# Patient Record
Sex: Female | Born: 1960 | Race: White | Hispanic: No | Smoking: Current every day smoker
Health system: Southern US, Community
[De-identification: ages and names within clinical notes are randomized; demographics above are authoritative.]

## PROBLEM LIST (undated history)

## (undated) DIAGNOSIS — G4730 Sleep apnea, unspecified: Secondary | ICD-10-CM

## (undated) DIAGNOSIS — M549 Dorsalgia, unspecified: Secondary | ICD-10-CM

## (undated) DIAGNOSIS — F32A Depression, unspecified: Secondary | ICD-10-CM

## (undated) DIAGNOSIS — R911 Solitary pulmonary nodule: Secondary | ICD-10-CM

## (undated) DIAGNOSIS — M51369 Other intervertebral disc degeneration, lumbar region without mention of lumbar back pain or lower extremity pain: Secondary | ICD-10-CM

## (undated) DIAGNOSIS — E7800 Pure hypercholesterolemia, unspecified: Secondary | ICD-10-CM

## (undated) DIAGNOSIS — F419 Anxiety disorder, unspecified: Secondary | ICD-10-CM

## (undated) DIAGNOSIS — J939 Pneumothorax, unspecified: Secondary | ICD-10-CM

## (undated) DIAGNOSIS — M1712 Unilateral primary osteoarthritis, left knee: Secondary | ICD-10-CM

## (undated) DIAGNOSIS — F17200 Nicotine dependence, unspecified, uncomplicated: Secondary | ICD-10-CM

## (undated) DIAGNOSIS — M5136 Other intervertebral disc degeneration, lumbar region: Secondary | ICD-10-CM

## (undated) DIAGNOSIS — J449 Chronic obstructive pulmonary disease, unspecified: Secondary | ICD-10-CM

## (undated) DIAGNOSIS — M5416 Radiculopathy, lumbar region: Secondary | ICD-10-CM

## (undated) DIAGNOSIS — K219 Gastro-esophageal reflux disease without esophagitis: Secondary | ICD-10-CM

## (undated) DIAGNOSIS — F329 Major depressive disorder, single episode, unspecified: Secondary | ICD-10-CM

## (undated) HISTORY — PX: FOOT SURGERY: SHX648

## (undated) HISTORY — PX: COLONOSCOPY: SHX174

## (undated) HISTORY — DX: Sleep apnea, unspecified: G47.30

## (undated) HISTORY — PX: ECTOPIC PREGNANCY SURGERY: SHX613

## (undated) HISTORY — PX: TRIGGER FINGER RELEASE: SHX641

## (undated) HISTORY — PX: ABDOMINAL HYSTERECTOMY: SHX81

---
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
## 2004-04-16 ENCOUNTER — Other Ambulatory Visit: Payer: Self-pay

## 2005-07-13 ENCOUNTER — Ambulatory Visit: Payer: Self-pay | Admitting: Internal Medicine

## 2006-08-06 ENCOUNTER — Ambulatory Visit: Payer: Self-pay | Admitting: Internal Medicine

## 2007-08-21 ENCOUNTER — Ambulatory Visit: Payer: Self-pay | Admitting: Internal Medicine

## 2008-08-31 ENCOUNTER — Ambulatory Visit: Payer: Self-pay | Admitting: Internal Medicine

## 2009-09-06 ENCOUNTER — Ambulatory Visit: Payer: Self-pay | Admitting: Internal Medicine

## 2010-10-17 ENCOUNTER — Ambulatory Visit: Payer: Self-pay | Admitting: Internal Medicine

## 2011-07-12 ENCOUNTER — Inpatient Hospital Stay: Payer: Self-pay | Admitting: Internal Medicine

## 2011-08-20 ENCOUNTER — Ambulatory Visit: Payer: Self-pay | Admitting: Gastroenterology

## 2011-08-23 LAB — PATHOLOGY REPORT

## 2012-09-03 ENCOUNTER — Ambulatory Visit: Payer: Self-pay | Admitting: Internal Medicine

## 2013-09-03 ENCOUNTER — Ambulatory Visit: Payer: Self-pay | Admitting: Physical Medicine and Rehabilitation

## 2013-09-08 ENCOUNTER — Ambulatory Visit: Payer: Self-pay | Admitting: Internal Medicine

## 2015-05-31 ENCOUNTER — Other Ambulatory Visit: Payer: Self-pay | Admitting: Internal Medicine

## 2015-05-31 DIAGNOSIS — Z1231 Encounter for screening mammogram for malignant neoplasm of breast: Secondary | ICD-10-CM

## 2015-06-16 ENCOUNTER — Ambulatory Visit: Payer: Self-pay | Attending: Internal Medicine

## 2015-06-28 ENCOUNTER — Other Ambulatory Visit: Payer: Self-pay | Admitting: Neurosurgery

## 2015-06-28 DIAGNOSIS — G8929 Other chronic pain: Secondary | ICD-10-CM

## 2015-06-28 DIAGNOSIS — M545 Low back pain: Principal | ICD-10-CM

## 2015-08-24 ENCOUNTER — Ambulatory Visit (INDEPENDENT_AMBULATORY_CARE_PROVIDER_SITE_OTHER): Payer: BC Managed Care – PPO | Admitting: Podiatry

## 2015-08-24 ENCOUNTER — Encounter: Payer: Self-pay | Admitting: Podiatry

## 2015-08-24 ENCOUNTER — Ambulatory Visit (INDEPENDENT_AMBULATORY_CARE_PROVIDER_SITE_OTHER): Payer: BC Managed Care – PPO

## 2015-08-24 VITALS — BP 163/91 | HR 51 | Resp 16 | Ht 67.0 in | Wt 160.0 lb

## 2015-08-24 DIAGNOSIS — M722 Plantar fascial fibromatosis: Secondary | ICD-10-CM | POA: Diagnosis not present

## 2015-08-24 DIAGNOSIS — M79673 Pain in unspecified foot: Secondary | ICD-10-CM

## 2015-08-24 DIAGNOSIS — Q828 Other specified congenital malformations of skin: Secondary | ICD-10-CM

## 2015-08-24 MED ORDER — MELOXICAM 15 MG PO TABS: 15.0000 mg | ORAL_TABLET | Freq: Every day | ORAL | Status: DC

## 2015-08-24 MED ORDER — METHYLPREDNISOLONE 4 MG PO TBPK: ORAL_TABLET | ORAL | Status: DC

## 2015-08-24 NOTE — Progress Notes (Signed)
   Subjective:    Patient ID: Vanessa Greer, female    DOB: October 06, 1961, 54 y.o.   MRN: [id number]  HPI: She presents today chief complaint painful right ankle on the lateral side we states that she started on the medial side. She states that she has now developed a painful lesion beneath the fifth metatarsal where she had previously had surgery to prevent its return. She denies any trauma to the foot agrees that has gotten worse over the past several months.    Review of Systems  All other systems reviewed and are negative.      Objective:   Physical Exam: 54 year old female no acute distress vital signs stable alert and oriented 3 pulses are strongly palpable bilateral. Neurologic sensorium is intact per Semmes-Weinstein monofilament. Deep tendon reflexes are intact. Muscle strength is 5 over 5 dorsiflexion plantar flexors and inverters and everters all intrinsic musculature is intact. Orthopedic evaluation demonstrates all joints distal to the ankle for range of motion without crepitation. She has pain on palpation of the medial calcaneal tubercles bilaterally she also has tenderness on palpation of the posterior tibial tendon and the peroneal tendons. Cutaneous evaluation does demonstrate reactive hyperkeratosis to the plantar aspect of the fifth metatarsal of the right foot. 3 views radiographs taken today in the office demonstrate lateral views soft tissue increase in density plantar fascial calcaneal insertion site of the right foot. A screw to the fifth metatarsal which went on to heal uneventfully status post fifth metatarsal osteotomy. There is no prominent plantar condyle that should result in symptoms similar to this.        Assessment & Plan:  Assessment: Plantar fasciitis right foot resulting in lateral compensatory pain and porokeratosis fifth right. This is also compensating for periodic tendinitis and posterior tibial tendinitis.  Plan: Discussed etiology pathology  conservative versus surgical therapies. We discussed appropriate shoe gear stretching exercises ice therapy shoe gear modifications. A prescription for Medrol Dosepak was prescribed as well as meloxicam. I injected her right heel today with Kenalog and local and aesthetic dispensable myofascial brace and a night splint. We will follow up with her in 1 month.

## 2015-08-24 NOTE — Patient Instructions (Signed)

## 2015-08-25 ENCOUNTER — Ambulatory Visit: Payer: BC Managed Care – PPO

## 2015-09-28 ENCOUNTER — Ambulatory Visit: Payer: BC Managed Care – PPO | Admitting: Podiatry

## 2015-11-07 ENCOUNTER — Emergency Department: Payer: BLUE CROSS/BLUE SHIELD

## 2015-11-07 ENCOUNTER — Emergency Department
Admission: EM | Admit: 2015-11-07 | Discharge: 2015-11-07 | Disposition: A | Discharge status: Home / Self Care | Payer: BLUE CROSS/BLUE SHIELD | Attending: Emergency Medicine | Admitting: Emergency Medicine

## 2015-11-07 ENCOUNTER — Encounter: Payer: Self-pay | Admitting: Emergency Medicine

## 2015-11-07 DIAGNOSIS — S42254A Nondisplaced fracture of greater tuberosity of right humerus, initial encounter for closed fracture: Secondary | ICD-10-CM | POA: Diagnosis not present

## 2015-11-07 DIAGNOSIS — Z791 Long term (current) use of non-steroidal anti-inflammatories (NSAID): Secondary | ICD-10-CM | POA: Insufficient documentation

## 2015-11-07 DIAGNOSIS — Y998 Other external cause status: Secondary | ICD-10-CM | POA: Diagnosis not present

## 2015-11-07 DIAGNOSIS — W01198A Fall on same level from slipping, tripping and stumbling with subsequent striking against other object, initial encounter: Secondary | ICD-10-CM | POA: Diagnosis not present

## 2015-11-07 DIAGNOSIS — Y9389 Activity, other specified: Secondary | ICD-10-CM | POA: Insufficient documentation

## 2015-11-07 DIAGNOSIS — Z79899 Other long term (current) drug therapy: Secondary | ICD-10-CM | POA: Insufficient documentation

## 2015-11-07 DIAGNOSIS — Y9289 Other specified places as the place of occurrence of the external cause: Secondary | ICD-10-CM | POA: Diagnosis not present

## 2015-11-07 DIAGNOSIS — Z720 Tobacco use: Secondary | ICD-10-CM | POA: Diagnosis not present

## 2015-11-07 DIAGNOSIS — S4991XA Unspecified injury of right shoulder and upper arm, initial encounter: Secondary | ICD-10-CM | POA: Diagnosis present

## 2015-11-07 HISTORY — DX: Dorsalgia, unspecified: M54.9

## 2015-11-07 HISTORY — DX: Major depressive disorder, single episode, unspecified: F32.9

## 2015-11-07 HISTORY — DX: Pneumothorax, unspecified: J93.9

## 2015-11-07 HISTORY — DX: Anxiety disorder, unspecified: F41.9

## 2015-11-07 HISTORY — DX: Gastro-esophageal reflux disease without esophagitis: K21.9

## 2015-11-07 HISTORY — DX: Pure hypercholesterolemia, unspecified: E78.00

## 2015-11-07 HISTORY — DX: Depression, unspecified: F32.A

## 2015-11-07 MED ORDER — TRAMADOL HCL 50 MG PO TABS
50.0000 mg | ORAL_TABLET | Freq: Once | ORAL | Status: AC
  Administered 2015-11-07: 50 mg via ORAL
  Filled 2015-11-07: qty 1

## 2015-11-07 MED ORDER — TRAMADOL HCL 50 MG PO TABS: 50.0000 mg | ORAL_TABLET | Freq: Four times a day (QID) | ORAL | Status: DC | PRN

## 2015-11-07 MED ORDER — KETOROLAC TROMETHAMINE 60 MG/2ML IM SOLN
60.0000 mg | Freq: Once | INTRAMUSCULAR | Status: AC
  Administered 2015-11-07: 60 mg via INTRAMUSCULAR
  Filled 2015-11-07: qty 2

## 2015-11-07 NOTE — ED Notes (Signed)
Pt uprite on stretcher in exam room with no distress noted; reports at 3am tripped over her dog injuring right upper arm; denies any other c/o or injuries; no swelling noted, skin intact; +radial pulse, skin W&D, brisk cap refill; good movement/sensation noted

## 2015-11-07 NOTE — ED Notes (Signed)
Pt says she tripped over  Her dog this am; c/o pain to right upper arm; tender on palpation; denies other injuries

## 2015-11-07 NOTE — Discharge Instructions (Signed)
Shoulder Fracture °You have a fractured humerus (bone in the upper arm) at the shoulder just below the ball of the shoulder joint. Most of the time the bones of a broken shoulder are in an acceptable position. Usually the injury can be treated with a shoulder immobilizer or sling and swath bandage. These devices support the arm and prevent any shoulder movement. If the bones are not in a good position, then surgery is sometimes needed. Shoulder fractures usually cause swelling, pain, and discoloration around the upper arm initially. They heal in 8-12 weeks with proper treatment. °Rest in bed or a reclining chair as long as your shoulder is very painful. Sitting up generally results in less pain at the fracture site. Do not remove your shoulder bandage until your caregiver approves. You may apply ice packs over the shoulder for 20-30 minutes every 2 hours for the next 2-3 days to reduce the pain and swelling. Use your pain medicine as prescribed.  °SEEK IMMEDIATE MEDICAL CARE IF: °· You develop severe shoulder pain unrelieved by rest and taking pain medicine. °· You have pain, numbness, tingling, or weakness in the hand or wrist. °· You develop shortness of breath, chest pain, severe weakness, or fainting. °· You have severe pain with motion of the fingers or wrist. °MAKE SURE YOU:  °· Understand these instructions. °· Will watch your condition. °· Will get help right away if you are not doing well or get worse. °  °This information is not intended to replace advice given to you by your health care provider. Make sure you discuss any questions you have with your health care provider. °  °Document Released: 01/24/2005 Document Revised: 03/10/2012 Document Reviewed: 04/06/2009 °Elsevier Interactive Patient Education ©2016 [employer] ° °

## 2015-11-07 NOTE — ED Notes (Signed)
Pt to go to xray and then moved to room 19 for treatment

## 2015-11-07 NOTE — ED Provider Notes (Signed)
[facility] Emergency Department Provider Note  ____________________________________________  Time seen: Approximately 913 AM  I have reviewed the triage vital signs and the nursing notes.   HISTORY  Chief Complaint Fall and Arm Pain    HPI Vanessa Greer is a 54 y.o. female who comes into the hospital today with a fall. The patient reports that she tripped over her dog when she woke up and fell on her right side. The patient thinks she fell on her shoulder more than her elbow. It took her 15 minutes to get up but she reports that she did not hit her head. The patient reports that this occurred at 3 AM and she scraped her left foot. The patient denies loss of consciousness. She reports she has shoulder pain and mild arm pain. The patient took a shower to see if the pain would improve but it did not. The patient reports that if it moves she hurts but if she sitting still the pain is a 0. The patient also has a history of chronic back pain and has some mild back pain. The patient is here for further evaluation.   Past Medical History  Diagnosis Date  . Pneumothorax   . Anxiety   . GERD (gastroesophageal reflux disease)   . Back pain   . Depression   . High cholesterol     There are no active problems to display for this patient.   Past Surgical History  Procedure Laterality Date  . Abdominal hysterectomy    . Foot surgery    . Ectopic pregnancy surgery      Current Outpatient Rx  Name  Route  Sig  Dispense  Refill  . clonazePAM (KLONOPIN) 1 MG tablet            0   . FluPHENAZine HCl (PROLIXIN PO)   Oral   Take by mouth.         Marland Kitchen. HYDROcodone-acetaminophen (NORCO) 7.5-325 MG per tablet      take 1 tablet by mouth every 6 hours if needed for back pain      0   . LYRICA 150 MG capsule            0     Dispense as written.   . meloxicam (MOBIC) 15 MG tablet   Oral   Take 1 tablet (15 mg total) by mouth daily.   30 tablet   3    . methylPREDNISolone (MEDROL) 4 MG TBPK tablet      Tapering 6 day dose pack   21 tablet   0   . sertraline (ZOLOFT) 50 MG tablet            0   . simvastatin (ZOCOR) 40 MG tablet            0   . traMADol (ULTRAM) 50 MG tablet   Oral   Take 1 tablet (50 mg total) by mouth every 6 (six) hours as needed.   12 tablet   0     Allergies Review of patient's allergies indicates no known allergies.  History reviewed. No pertinent family history.  Social History Social History  Substance Use Topics  . Smoking status: Current Every Day Smoker -- 6.00 packs/day    Types: Cigarettes  . Smokeless tobacco: None  . Alcohol Use: No    Review of Systems Constitutional: No fever/chills Eyes: No visual changes. ENT: No sore throat. Cardiovascular: Denies chest pain. Respiratory: Denies shortness of  breath. Gastrointestinal: No abdominal pain.  No nausea, no vomiting.  No diarrhea.  No constipation. Genitourinary: Negative for dysuria. Musculoskeletal: Right shoulder pain Skin: Negative for rash. Neurological: Negative for headaches, focal weakness or numbness.  10-point ROS otherwise negative.  ____________________________________________   PHYSICAL EXAM:  VITAL SIGNS: ED Triage Vitals  Enc Vitals Group     BP 11/07/15 0541 145/80 mmHg     Pulse Rate 11/07/15 0541 87     Resp 11/07/15 0541 18     Temp 11/07/15 0541 98.2 F (36.8 C)     Temp Source 11/07/15 0541 Oral     SpO2 11/07/15 0541 97 %     Weight 11/07/15 0541 160 lb (72.576 kg)     Height 11/07/15 0541  (1.702 m)     Head Cir --      Peak Flow --      Pain Score 11/07/15 0542 9     Pain Loc --      Pain Edu? --      Excl. in GC? --     Constitutional: Alert and oriented. Well appearing and in moderate distress. Eyes: Conjunctivae are normal. PERRL. EOMI. Head: Atraumatic. Nose: No congestion/rhinnorhea. Mouth/Throat: Mucous membranes are moist.  Oropharynx  non-erythematous. Cardiovascular: Normal rate, regular rhythm. Grossly normal heart sounds.  Good peripheral circulation. Respiratory: Normal respiratory effort.  No retractions. Lungs CTAB. Gastrointestinal: Soft and nontender. No distention. Positive bowel sounds Musculoskeletal: No lower extremity tenderness nor edema.  Tenderness palpation along the acromioclavicular joint and posteriorly. The patient has some mild tenderness to palpation over her deltoid on the right as well pain with extension and lateral raise of the patient's shoulder. Neurologic:  Normal speech and language. Skin:  Skin is warm, dry and intact.  Psychiatric: Mood and affect are normal.   ____________________________________________   LABS (all labs ordered are listed, but only abnormal results are displayed)  Labs Reviewed - No data to display ____________________________________________  EKG  None ____________________________________________  RADIOLOGY  Right humerus x-ray: Probable nondisplaced greater tuberosity fracture. ____________________________________________   PROCEDURES  Procedure(s) performed: None  Critical Care performed: No  ____________________________________________   INITIAL IMPRESSION / ASSESSMENT AND PLAN / ED COURSE  Pertinent labs & imaging results that were available during my care of the patient were reviewed by me and considered in my medical decision making (see chart for details).  This is a 54 year old female who comes in today after a fall on her shoulder. The patient has some shoulder pain and the x-ray shows a probable nondisplaced fracture. I will place the patient in a sling and give her some medication for pain to include Toradol and tramadol. I will discharge the patient to have her follow-up with orthopedic surgery. She will need a repeat of her x-ray to determine if there is any healing callus formation the patient initially was reporting pain in her mid arm as  well. ____________________________________________   FINAL CLINICAL IMPRESSION(S) / ED DIAGNOSES  Final diagnoses:  Nondisplaced fracture of greater tuberosity of humerus, right, closed, initial encounter      Rebecka Apley, MD 11/07/15 [phone]

## 2016-03-05 ENCOUNTER — Ambulatory Visit
Admission: RE | Admit: 2016-03-05 | Discharge: 2016-03-05 | Disposition: A | Discharge status: Home / Self Care | Payer: BC Managed Care – PPO | Source: Ambulatory Visit | Attending: Internal Medicine | Admitting: Internal Medicine

## 2016-03-05 ENCOUNTER — Other Ambulatory Visit: Payer: Self-pay | Admitting: Internal Medicine

## 2016-03-05 DIAGNOSIS — R05 Cough: Secondary | ICD-10-CM

## 2016-03-05 DIAGNOSIS — R059 Cough, unspecified: Secondary | ICD-10-CM

## 2016-03-05 DIAGNOSIS — J439 Emphysema, unspecified: Secondary | ICD-10-CM | POA: Diagnosis not present

## 2016-03-05 DIAGNOSIS — I517 Cardiomegaly: Secondary | ICD-10-CM | POA: Insufficient documentation

## 2016-03-08 ENCOUNTER — Ambulatory Visit
Admission: RE | Admit: 2016-03-08 | Discharge: 2016-03-08 | Disposition: A | Discharge status: Home / Self Care | Payer: BC Managed Care – PPO | Source: Ambulatory Visit | Attending: Internal Medicine | Admitting: Internal Medicine

## 2016-03-08 DIAGNOSIS — Z1231 Encounter for screening mammogram for malignant neoplasm of breast: Secondary | ICD-10-CM

## 2016-04-18 ENCOUNTER — Ambulatory Visit (INDEPENDENT_AMBULATORY_CARE_PROVIDER_SITE_OTHER): Payer: BC Managed Care – PPO | Admitting: Podiatry

## 2016-04-18 ENCOUNTER — Ambulatory Visit (INDEPENDENT_AMBULATORY_CARE_PROVIDER_SITE_OTHER): Payer: BC Managed Care – PPO

## 2016-04-18 DIAGNOSIS — M25571 Pain in right ankle and joints of right foot: Secondary | ICD-10-CM | POA: Diagnosis not present

## 2016-04-18 DIAGNOSIS — Q828 Other specified congenital malformations of skin: Secondary | ICD-10-CM

## 2016-04-18 DIAGNOSIS — M7671 Peroneal tendinitis, right leg: Secondary | ICD-10-CM

## 2016-04-19 NOTE — Progress Notes (Signed)
She presents today for follow-up of her plantar fasciitis which she states has resolved 100%. She states that she does still have some swelling to the lateral ankle which is painful. She also has a plantar lesion sub-fifth metatarsal head of the left foot.   Objective: Vital signs are stable alert and oriented 3. Pulses are palpable. Pain. She has pain on eversion against resistance with some swelling just distal to the lateral limbus and inferior to it. Majority of her symptoms are from the inferior malleolus extending to the fifth metatarsal base.  Assessment: peroneal tendinitis right foot. Porokeratosis.  Plan: I injected the peroneal tendons today with Kenalog and local anesthetic into the tendon sheath. Hopefully this will alleviate her symptoms I also debrided the porokeratotic lesion sub-fifth metatarsal head of the right foot. Follow-up with her in 1 month consider MRI at not improved. 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

## 2016-05-23 ENCOUNTER — Ambulatory Visit: Payer: BC Managed Care – PPO | Admitting: Podiatry

## 2016-06-18 ENCOUNTER — Telehealth: Payer: Self-pay | Admitting: *Deleted

## 2016-06-18 NOTE — Telephone Encounter (Addendum)
Pt states she has right ankle pain and has an appt in about 2 weeks, she would like to know what to do until then.  Left message for pt to go back in the plantar fascial brace given in 08/2015, ice 3-4 times daily and rest, if able to tolerate OTC antiinflammatory medication to take as the package instructs for inflammation and pain. Pt called again and asked if she could wear the ankle brace she received from the urgent care last week.  I told her the ankle brace, ice and rest.

## 2016-06-25 ENCOUNTER — Ambulatory Visit (INDEPENDENT_AMBULATORY_CARE_PROVIDER_SITE_OTHER): Payer: BC Managed Care – PPO

## 2016-06-25 ENCOUNTER — Ambulatory Visit (INDEPENDENT_AMBULATORY_CARE_PROVIDER_SITE_OTHER): Payer: BC Managed Care – PPO | Admitting: Podiatry

## 2016-06-25 DIAGNOSIS — M7671 Peroneal tendinitis, right leg: Secondary | ICD-10-CM | POA: Diagnosis not present

## 2016-06-25 DIAGNOSIS — Q828 Other specified congenital malformations of skin: Secondary | ICD-10-CM

## 2016-06-25 DIAGNOSIS — M79671 Pain in right foot: Secondary | ICD-10-CM | POA: Diagnosis not present

## 2016-06-25 NOTE — Progress Notes (Signed)
She was in back in April complaining of peroneal tendon pain. She is provided with an injection and she was doing very well. She states that recently she was out walking as she was leaving work and heard a pop with pain to her right foot. She states that the foot swelled up immediately and was immediately black and blue. She sought medical attention he placed her in a ankle brace. She states that the pain is resolving. She wanted us to look at it.  Objective: Vital signs are stable alert and oriented 3. Pulses are palpable. Neurologic sensorium is intact. Deep tendon reflexes are intact muscle strength is normal. She has some tenderness on palpation of the proximal peroneal tendons minimally distally. Peroneal have nice abduction against resistance with plantarflexion and abduction against resistance. Ankle joint is normal. Radiographs confirmed.  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  Plan: Follow up with me on an as-needed basis continue to wear the brace for the next 4 weeks.

## 2016-08-22 IMAGING — CR DG CHEST 2V
1 series · 2 of 2 positions shown · non-contrast
Comparison: None.

CLINICAL DATA: Cough. Chest tightness. The patient aspirated on a
grape last week.

EXAM:
CHEST - 2 VIEW

[Series 1: pa · 0.17mm/px · 2 of 2 slices shown]
[im 1/2]
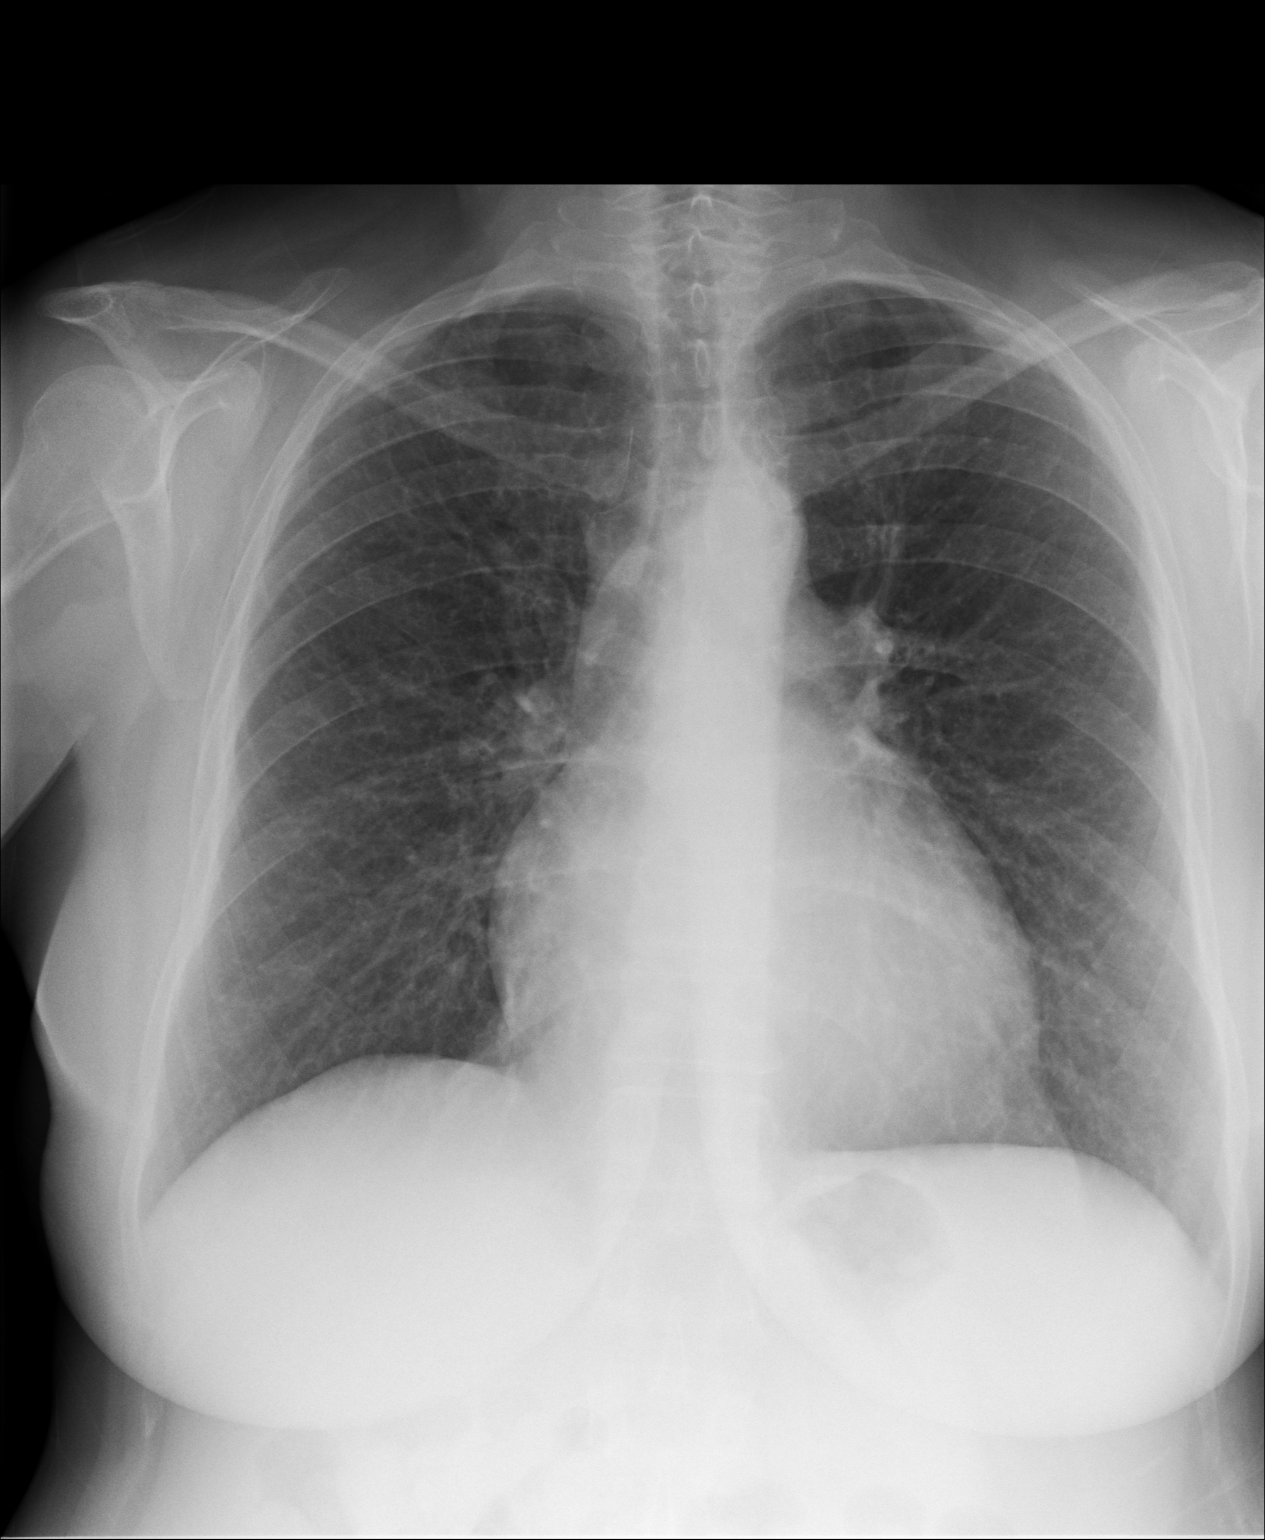
[im 2/2]
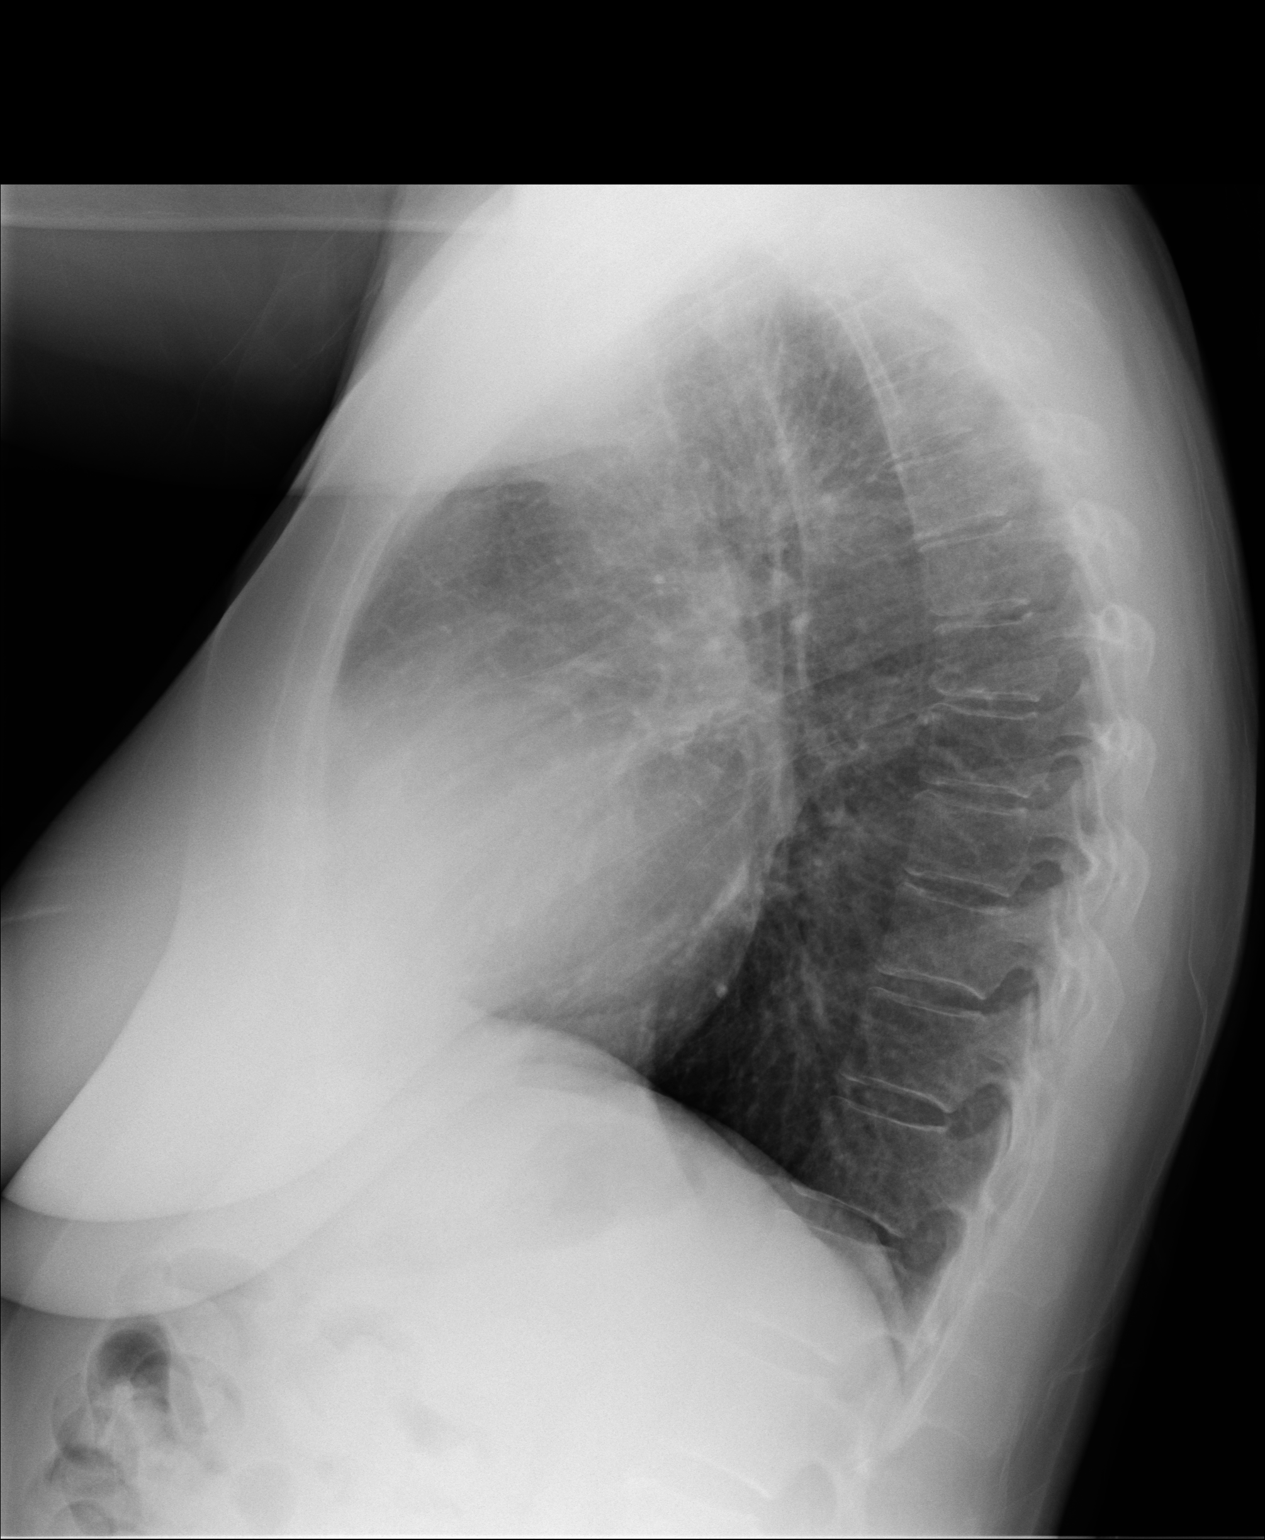

[2 of 2 positions shown; findings below may reference images not displayed]

FINDINGS: The heart is mildly enlarged. There is no edema or effusion to
suggest failure. The lungs are mildly hyperexpanded, suggesting
COPD.
IMPRESSION: Cardiomegaly without failure.

Emphysema.

No acute cardiopulmonary disease.

## 2016-10-03 ENCOUNTER — Other Ambulatory Visit: Payer: Self-pay | Admitting: Internal Medicine

## 2016-10-03 DIAGNOSIS — M545 Low back pain: Principal | ICD-10-CM

## 2016-10-03 DIAGNOSIS — G8929 Other chronic pain: Secondary | ICD-10-CM

## 2016-10-03 DIAGNOSIS — M5416 Radiculopathy, lumbar region: Secondary | ICD-10-CM

## 2016-10-15 ENCOUNTER — Ambulatory Visit: Payer: BC Managed Care – PPO

## 2017-01-28 DIAGNOSIS — K219 Gastro-esophageal reflux disease without esophagitis: Secondary | ICD-10-CM | POA: Insufficient documentation

## 2017-01-29 DIAGNOSIS — G8929 Other chronic pain: Secondary | ICD-10-CM | POA: Insufficient documentation

## 2017-01-29 DIAGNOSIS — M5441 Lumbago with sciatica, right side: Secondary | ICD-10-CM

## 2017-01-29 DIAGNOSIS — M5442 Lumbago with sciatica, left side: Secondary | ICD-10-CM

## 2017-01-30 ENCOUNTER — Other Ambulatory Visit: Payer: Self-pay | Admitting: Internal Medicine

## 2017-01-30 DIAGNOSIS — M5137 Other intervertebral disc degeneration, lumbosacral region: Secondary | ICD-10-CM

## 2017-02-08 ENCOUNTER — Ambulatory Visit
Admission: RE | Admit: 2017-02-08 | Discharge: 2017-02-08 | Disposition: A | Discharge status: Home / Self Care | Payer: BC Managed Care – PPO | Source: Ambulatory Visit | Attending: Internal Medicine | Admitting: Internal Medicine

## 2017-02-08 DIAGNOSIS — M47816 Spondylosis without myelopathy or radiculopathy, lumbar region: Secondary | ICD-10-CM | POA: Diagnosis not present

## 2017-02-08 DIAGNOSIS — M48061 Spinal stenosis, lumbar region without neurogenic claudication: Secondary | ICD-10-CM | POA: Insufficient documentation

## 2017-02-08 DIAGNOSIS — M5137 Other intervertebral disc degeneration, lumbosacral region: Secondary | ICD-10-CM | POA: Diagnosis present

## 2017-04-03 ENCOUNTER — Other Ambulatory Visit: Payer: Self-pay | Admitting: Internal Medicine

## 2017-04-03 DIAGNOSIS — Z1231 Encounter for screening mammogram for malignant neoplasm of breast: Secondary | ICD-10-CM

## 2017-04-24 ENCOUNTER — Ambulatory Visit
Admission: RE | Admit: 2017-04-24 | Discharge: 2017-04-24 | Disposition: A | Discharge status: Home / Self Care | Payer: BC Managed Care – PPO | Source: Ambulatory Visit | Attending: Internal Medicine | Admitting: Internal Medicine

## 2017-04-24 DIAGNOSIS — Z1231 Encounter for screening mammogram for malignant neoplasm of breast: Secondary | ICD-10-CM

## 2017-11-19 ENCOUNTER — Ambulatory Visit: Payer: Self-pay | Admitting: Surgery

## 2017-11-19 NOTE — H&P (View-Only) (Signed)
Vanessa Greer 11/19/2017 2:59 PM Location: [place] Office Patient #: [id number] DOB: 1961-03-10 Married / Language: [place] / Race: White Female  History of Present Illness [name] MD; 11/19/2017 3:37 PM) The patient is a 56 year old female who presents with anal lesions. Note for "Anal lesions": ` ` ` Patient sent for surgical consultation at the request of Dr. Marva Panda & [facility]  Chief Complaint: Recurrent condylomata  The patient is a pleasant smoking female. In a monogamous relationship for 30 years but noticed some perianal lumps. Required surgical treatment years ago. Cannot remember was laser cautery. Did not have any issues but then noticed recurrent lumps. Wart strongly suspected on recent screening colonoscopy this summer. Recommended by her gastrologist to get it treated. She saw dermatology. They have been trying to do cryoablation. 3 rounds of been done but yet they persist. Surgical consultation requested for more aggressive treatment.  Patient smokes. Usually less than half a pack a day. She moves her bowels once or twice a day. Colonoscopy did not reveal any major issues. She usually can walk 30 minutes without much difficulty. She had her tubes tied and an abdominal hysterectomy/appendectomy over 20 years ago. No other surgeries. No episodes of pain with defecation or bleeding. No incontinence. No urinary issues. No history of unprotected sex. She is not HIV positive. No other STDs.  (Review of systems as stated in this history (HPI) or in the review of systems. Otherwise all other 12 point ROS are negative)   Past Surgical History [name], CMA; 11/19/2017 2:59 PM) Appendectomy Colon Polyp Removal - Colonoscopy Foot Surgery Right. Hysterectomy (not due to cancer) - Complete  Diagnostic Studies History [name], CMA; 11/19/2017 2:59 PM) Colonoscopy within last year Mammogram within last year  Allergies [name], CMA; 11/19/2017 3:04 PM) No Known Drug Allergies 11/19/2017  Medication History (Alisha Spillers, CMA; 11/19/2017 3:06 PM) Hydrocodone-Acetaminophen (7.5-325MG  Tablet, Oral) Active. ClonazePAM (1MG  Tablet, Oral) Active. Lyrica (150MG  Capsule, Oral) Active. Pantoprazole Sodium (40MG  Tablet DR, Oral) Active. Sertraline HCl (100MG  Tablet, Oral) Active. Simvastatin (40MG  Tablet, Oral) Active. Vitamin D (1000UNIT Tablet, Oral) Active. Omega 3 (1000MG  Capsule, Oral) Active. Turmeric (500MG  Tablet, Oral) Active. Medications Reconciled  Social History [name], CMA; 11/19/2017 2:59 PM) Alcohol use Occasional alcohol use. Caffeine use Coffee, Tea. No drug use Tobacco use Former smoker.  Family History [name], CMA; 11/19/2017 2:59 PM) Alcohol Abuse Father. Arthritis Father, Mother. Breast Cancer Mother. Colon Polyps Father. Depression Mother. Diabetes Mellitus Brother, Family Members In General. Heart Disease Father. Heart disease in female family member before age 28 Heart disease in female family member before age 31 Hypertension Brother, Father, Sister. Prostate Cancer Father. Respiratory Condition Father. Thyroid problems Mother.  Pregnancy / Birth History [name], CMA; 11/19/2017 2:59 PM) Age at menarche 14 years. Age of menopause <46 Contraceptive History Oral contraceptives. Gravida 2 Maternal age 58-25 Para 1  Other Problems [name], CMA; 11/19/2017 2:59 PM) Anxiety Disorder Back Pain Diverticulosis Gastroesophageal Reflux Disease Hypercholesterolemia Oophorectomy     Review of Systems (Alisha Spillers CMA; 11/19/2017 2:59 PM) Skin Present- Change in Wart/Mole and Dryness. Not Present- Hives, Jaundice, New Lesions, Non-Healing Wounds, Rash and Ulcer. HEENT Present- Wears glasses/contact lenses. Not Present- Earache, Hearing Loss, Hoarseness, Nose Bleed, Oral Ulcers, Ringing in  the Ears, Seasonal Allergies, Sinus Pain, Sore Throat, Visual Disturbances and Yellow Eyes. Respiratory Not Present- Bloody sputum, Chronic Cough, Difficulty Breathing, Snoring and Wheezing. Breast Not Present- Breast Mass, Breast Pain, Nipple Discharge and  Skin Changes. Cardiovascular Not Present- Chest Pain, Difficulty Breathing Lying Down, Leg Cramps, Palpitations, Rapid Heart Rate, Shortness of Breath and Swelling of Extremities. Gastrointestinal Present- Indigestion. Not Present- Abdominal Pain, Bloating, Bloody Stool, Change in Bowel Habits, Chronic diarrhea, Constipation, Difficulty Swallowing, Excessive gas, Gets full quickly at meals, Hemorrhoids, Nausea, Rectal Pain and Vomiting. Female Genitourinary Not Present- Frequency, Nocturia, Painful Urination, Pelvic Pain and Urgency. Musculoskeletal Present- Back Pain. Not Present- Joint Pain, Joint Stiffness, Muscle Pain, Muscle Weakness and Swelling of Extremities. Neurological Not Present- Decreased Memory, Fainting, Headaches, Numbness, Seizures, Tingling, Tremor, Trouble walking and Weakness. Psychiatric Present- Anxiety. Not Present- Bipolar, Change in Sleep Pattern, Depression, Fearful and Frequent crying. Endocrine Present- Hot flashes. Not Present- Cold Intolerance, Excessive Hunger, Hair Changes, Heat Intolerance and New Diabetes. Hematology Not Present- Blood Thinners, Easy Bruising, Excessive bleeding, Gland problems, HIV and Persistent Infections.  Vitals (Alisha Spillers CMA; 11/19/2017 3:04 PM) 11/19/2017 3:04 PM Weight: 162.2 lb Height: 66in Body Surface Area: 1.83 m Body Mass Index: 26.18 kg/m  Pulse: 102 (Regular)  BP: 128/82 (Sitting, Left Arm, Standard)      Physical Exam [name](Kadyn Chovan C. Reyes Aldaco MD; 11/19/2017 3:18 PM)  General Mental Status-Alert. General Appearance-Not in acute distress, Not Sickly. Orientation-Oriented X3. Hydration-Well hydrated. Voice-Normal.  Integumentary Global  Assessment Upon inspection and palpation of skin surfaces of the - Axillae: non-tender, no inflammation or ulceration, no drainage. and Distribution of scalp and body hair is normal. General Characteristics Temperature - normal warmth is noted.  Head and Neck Head-normocephalic, atraumatic with no lesions or palpable masses. Face Global Assessment - atraumatic, no absence of expression. Neck Global Assessment - no abnormal movements, no bruit auscultated on the right, no bruit auscultated on the left, no decreased range of motion, non-tender. Trachea-midline. Thyroid Gland Characteristics - non-tender.  Eye Eyeball - Left-Extraocular movements intact, No Nystagmus. Eyeball - Right-Extraocular movements intact, No Nystagmus. Cornea - Left-No Hazy. Cornea - Right-No Hazy. Sclera/Conjunctiva - Left-No scleral icterus, No Discharge. Sclera/Conjunctiva - Right-No scleral icterus, No Discharge. Pupil - Left-Direct reaction to light normal. Pupil - Right-Direct reaction to light normal.  ENMT Ears Pinna - Left - no drainage observed, no generalized tenderness observed. Right - no drainage observed, no generalized tenderness observed. Nose and Sinuses External Inspection of the Nose - no destructive lesion observed. Inspection of the nares - Left - quiet respiration. Right - quiet respiration. Mouth and Throat Lips - Upper Lip - no fissures observed, no pallor noted. Lower Lip - no fissures observed, no pallor noted. Nasopharynx - no discharge present. Oral Cavity/Oropharynx - Tongue - no dryness observed. Oral Mucosa - no cyanosis observed. Hypopharynx - no evidence of airway distress observed.  Chest and Lung Exam Inspection Movements - Normal and Symmetrical. Accessory muscles - No use of accessory muscles in breathing. Palpation Palpation of the chest reveals - Non-tender. Auscultation Breath sounds - Normal and Clear.  Cardiovascular Auscultation Rhythm -  Regular. Murmurs & Other Heart Sounds - Auscultation of the heart reveals - No Murmurs and No Systolic Clicks.  Abdomen Inspection Inspection of the abdomen reveals - No Visible peristalsis and No Abnormal pulsations. Umbilicus - No Bleeding, No Urine drainage. Palpation/Percussion Palpation and Percussion of the abdomen reveal - Soft, Non Tender, No Rebound tenderness, No Rigidity (guarding) and No Cutaneous hyperesthesia. Note: Abdomen soft. Not severely distended. No distasis recti. No umbilical or other anterior abdominal wall hernias. Healed umbilical & Pfannenstiel incisions  Female Genitourinary Sexual Maturity Tanner 5 - Adult hair pattern. Note: Nontender. No  inguinal hernias. No inguinal lymphadenopathy. No condyloma. No major vaginal bleeding nor foul discharge  Rectal Note: Please refer to endoscopy section. Right lateral and left lateral perianal patches of hyperpigmentation and condyloma.  Peripheral Vascular Upper Extremity Inspection - Left - No Cyanotic nailbeds, Not Ischemic. Right - No Cyanotic nailbeds, Not Ischemic.  Neurologic Neurologic evaluation reveals -normal attention span and ability to concentrate, able to name objects and repeat phrases. Appropriate fund of knowledge , normal sensation and normal coordination. Mental Status Affect - not angry, not paranoid. Cranial Nerves-Normal Bilaterally. Gait-Normal.  Neuropsychiatric Mental status exam performed with findings of-able to articulate well with normal speech/language, rate, volume and coherence, thought content normal with ability to perform basic computations and apply abstract reasoning and no evidence of hallucinations, delusions, obsessions or homicidal/suicidal ideation.  Musculoskeletal Global Assessment Spine, Ribs and Pelvis - no instability, subluxation or laxity. Right Upper Extremity - no instability, subluxation or laxity.  Lymphatic Head & Neck  General Head & Neck  Lymphatics: Bilateral - Description - No Localized lymphadenopathy. Axillary  General Axillary Region: Bilateral - Description - No Localized lymphadenopathy. Femoral & Inguinal  Generalized Femoral & Inguinal Lymphatics: Left - Description - No Localized lymphadenopathy. Right - Description - No Localized lymphadenopathy.   Results [name] MD; 11/19/2017 3:33 PM) Procedures  Name Value Date Hemorrhoids Procedure Anal exam: condyloma Internal exam: Internal Hemorroids ( non-bleeding) Other: Right and left lateral perianal 3x3cm patches of hyperpigmentation and condyloma. Perianal skin clean with good hygiene. No pruritis ani. No pilonidal disease. No fissure. No abscess/fistula. Normal sphincter tone.  No external hemorrhoids. Tolerates digital and anoscopic rectal exam. No rectal masses. Hemorrhoidal piles Grade 1. Exam done with assistance of female Medical Assistant in the room.  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  I think she would benefit from surgical ablation and/or removal. I think she would do well with laser ablation. Possible excisional biopsy. Outpatient surgery. She lives closer to [place], but is fine with getting surgery done in [place] if she can get done before the end of the year. We will see what options with CO2 LASER are available.  Current Plans Pt Education - [facility] Anal Warts (Sotero Brinkmeyer) You are being scheduled for surgery- Our schedulers will call you.  You should hear from our office's scheduling department within 5 working days about the location, date, and time of surgery. We try to make accommodations for patient's preferences in scheduling surgery, but sometimes the OR schedule or the surgeon's schedule prevents [place] from making those accommodations.  If you  have not heard from our office [phone]) in 5 working days, call the office and ask for your surgeon's nurse.  If you have other questions about your diagnosis, plan, or surgery, call the office and ask for your surgeon's nurse.  The anatomy & physiology of the anorectal region was discussed. The pathophysiology of anorectal warts and differential diagnosis was discussed. Natural history risks without surgery was discussed such as further growth and cancer. I stressed the importance of office follow-up to catch early recurrence & minimize/halt progression of disease. Interventions such as cauterization or cryotherapy by topical agents were discussed.  The patient's symptoms are not adequately controlled by non-operative treatments. I feel the risks & problems of no surgery outweigh the operative risks; therefore, I recommended surgery to treat the anal warts by removal,  ablation and/or cauterization.  Risks such as bleeding, infection, need for further treatment, heart attack, death, and other risks were discussed. I noted a good likelihood this will help address the problem. Goals of post-operative recovery were discussed as well. Possibility that this will not correct all symptoms was explained. Post-operative pain, bleeding, constipation, and other problems after surgery were discussed. We will work to minimize complications. Educational handouts further explaining the pathology, treatment options, and bowel regimen were given as well. Questions were answered. The patient expresses understanding & wishes to proceed with surgery.  ANOSCOPY, DIAGNOSTIC (46600) ENCOUNTER FOR PREOPERATIVE EXAMINATION FOR GENERAL SURGICAL PROCEDURE (Z01.818)  Current Plans You are being scheduled for surgery- Our schedulers will call you.  You should hear from our office's scheduling department within 5 working days about the location, date, and time of surgery. We try to make accommodations for  patient's preferences in scheduling surgery, but sometimes the OR schedule or the surgeon's schedule prevents us from making those accommodations.  If you have not heard from our office [phone]) in 5 working days, call the office and ask for your surgeon's nurse.  If you have other questions about your diagnosis, plan, or surgery, call the office and ask for your surgeon's nurse.  Pt Education - [facility] Rectal Prep for Anorectal outpatient/office surgery: discussed with patient and provided information. Pt Education - [facility] Rectal Surgery HCI (Damian Hofstra): discussed with patient and provided information. Pt Education - [facility] Good Bowel Health (Kelii Chittum)  Ardeth SportsmanSteven C. Hershy Flenner, M.D., F.A.C.S. Gastrointestinal and Minimally Invasive Surgery [facility] [address] [place], [place] [id number] [phone] Main / Paging

## 2017-11-19 NOTE — H&P (Signed)
Vanessa Greer 11/19/2017 2:59 PM Location: [place] Office Patient #: [id number] DOB: 1961-03-10 Married / Language: [place] / Race: White Female  History of Present Illness Vanessa Sportsman MD; 11/19/2017 3:37 PM) The patient is a 56 year old female who presents with anal lesions. Note for "Anal lesions": ` ` ` Patient sent for surgical consultation at the request of Dr. Marva Panda & [facility]  Chief Complaint: Recurrent condylomata  The patient is a pleasant smoking female. In a monogamous relationship for 30 years but noticed some perianal lumps. Required surgical treatment years ago. Cannot remember was laser cautery. Did not have any issues but then noticed recurrent lumps. Wart strongly suspected on recent screening colonoscopy this summer. Recommended by her gastrologist to get it treated. She saw dermatology. They have been trying to do cryoablation. 3 rounds of been done but yet they persist. Surgical consultation requested for more aggressive treatment.  Patient smokes. Usually less than half a pack a day. She moves her bowels once or twice a day. Colonoscopy did not reveal any major issues. She usually can walk 30 minutes without much difficulty. She had her tubes tied and an abdominal hysterectomy/appendectomy over 20 years ago. No other surgeries. No episodes of pain with defecation or bleeding. No incontinence. No urinary issues. No history of unprotected sex. She is not HIV positive. No other STDs.  (Review of systems as stated in this history (HPI) or in the review of systems. Otherwise all other 12 point ROS are negative)   Past Surgical History [name], CMA; 11/19/2017 2:59 PM) Appendectomy Colon Polyp Removal - Colonoscopy Foot Surgery Right. Hysterectomy (not due to cancer) - Complete  Diagnostic Studies History [name], CMA; 11/19/2017 2:59 PM) Colonoscopy within last year Mammogram within last year  Allergies [name], CMA; 11/19/2017 3:04 PM) No Known Drug Allergies 11/19/2017  Medication History (Alisha Spillers, CMA; 11/19/2017 3:06 PM) Hydrocodone-Acetaminophen (7.5-325MG  Tablet, Oral) Active. ClonazePAM (1MG  Tablet, Oral) Active. Lyrica (150MG  Capsule, Oral) Active. Pantoprazole Sodium (40MG  Tablet DR, Oral) Active. Sertraline HCl (100MG  Tablet, Oral) Active. Simvastatin (40MG  Tablet, Oral) Active. Vitamin D (1000UNIT Tablet, Oral) Active. Omega 3 (1000MG  Capsule, Oral) Active. Turmeric (500MG  Tablet, Oral) Active. Medications Reconciled  Social History [name], CMA; 11/19/2017 2:59 PM) Alcohol use Occasional alcohol use. Caffeine use Coffee, Tea. No drug use Tobacco use Former smoker.  Family History [name], CMA; 11/19/2017 2:59 PM) Alcohol Abuse Father. Arthritis Father, Mother. Breast Cancer Mother. Colon Polyps Father. Depression Mother. Diabetes Mellitus Brother, Family Members In General. Heart Disease Father. Heart disease in female family member before age 28 Heart disease in female family member before age 31 Hypertension Brother, Father, Sister. Prostate Cancer Father. Respiratory Condition Father. Thyroid problems Mother.  Pregnancy / Birth History [name], CMA; 11/19/2017 2:59 PM) Age at menarche 14 years. Age of menopause <46 Contraceptive History Oral contraceptives. Gravida 2 Maternal age 58-25 Para 1  Other Problems [name], CMA; 11/19/2017 2:59 PM) Anxiety Disorder Back Pain Diverticulosis Gastroesophageal Reflux Disease Hypercholesterolemia Oophorectomy     Review of Systems (Alisha Spillers CMA; 11/19/2017 2:59 PM) Skin Present- Change in Wart/Mole and Dryness. Not Present- Hives, Jaundice, New Lesions, Non-Healing Wounds, Rash and Ulcer. HEENT Present- Wears glasses/contact lenses. Not Present- Earache, Hearing Loss, Hoarseness, Nose Bleed, Oral Ulcers, Ringing in  the Ears, Seasonal Allergies, Sinus Pain, Sore Throat, Visual Disturbances and Yellow Eyes. Respiratory Not Present- Bloody sputum, Chronic Cough, Difficulty Breathing, Snoring and Wheezing. Breast Not Present- Breast Mass, Breast Pain, Nipple Discharge and  Skin Changes. Cardiovascular Not Present- Chest Pain, Difficulty Breathing Lying Down, Leg Cramps, Palpitations, Rapid Heart Rate, Shortness of Breath and Swelling of Extremities. Gastrointestinal Present- Indigestion. Not Present- Abdominal Pain, Bloating, Bloody Stool, Change in Bowel Habits, Chronic diarrhea, Constipation, Difficulty Swallowing, Excessive gas, Gets full quickly at meals, Hemorrhoids, Nausea, Rectal Pain and Vomiting. Female Genitourinary Not Present- Frequency, Nocturia, Painful Urination, Pelvic Pain and Urgency. Musculoskeletal Present- Back Pain. Not Present- Joint Pain, Joint Stiffness, Muscle Pain, Muscle Weakness and Swelling of Extremities. Neurological Not Present- Decreased Memory, Fainting, Headaches, Numbness, Seizures, Tingling, Tremor, Trouble walking and Weakness. Psychiatric Present- Anxiety. Not Present- Bipolar, Change in Sleep Pattern, Depression, Fearful and Frequent crying. Endocrine Present- Hot flashes. Not Present- Cold Intolerance, Excessive Hunger, Hair Changes, Heat Intolerance and New Diabetes. Hematology Not Present- Blood Thinners, Easy Bruising, Excessive bleeding, Gland problems, HIV and Persistent Infections.  Vitals (Alisha Spillers CMA; 11/19/2017 3:04 PM) 11/19/2017 3:04 PM Weight: 162.2 lb Height: 66in Body Surface Area: 1.83 m Body Mass Index: 26.18 kg/m  Pulse: 102 (Regular)  BP: 128/82 (Sitting, Left Arm, Standard)      Physical Exam Vanessa Greer(Mikail Goostree C. Latriece Anstine MD; 11/19/2017 3:18 PM)  General Mental Status-Alert. General Appearance-Not in acute distress, Not Sickly. Orientation-Oriented X3. Hydration-Well hydrated. Voice-Normal.  Integumentary Global  Assessment Upon inspection and palpation of skin surfaces of the - Axillae: non-tender, no inflammation or ulceration, no drainage. and Distribution of scalp and body hair is normal. General Characteristics Temperature - normal warmth is noted.  Head and Neck Head-normocephalic, atraumatic with no lesions or palpable masses. Face Global Assessment - atraumatic, no absence of expression. Neck Global Assessment - no abnormal movements, no bruit auscultated on the right, no bruit auscultated on the left, no decreased range of motion, non-tender. Trachea-midline. Thyroid Gland Characteristics - non-tender.  Eye Eyeball - Left-Extraocular movements intact, No Nystagmus. Eyeball - Right-Extraocular movements intact, No Nystagmus. Cornea - Left-No Hazy. Cornea - Right-No Hazy. Sclera/Conjunctiva - Left-No scleral icterus, No Discharge. Sclera/Conjunctiva - Right-No scleral icterus, No Discharge. Pupil - Left-Direct reaction to light normal. Pupil - Right-Direct reaction to light normal.  ENMT Ears Pinna - Left - no drainage observed, no generalized tenderness observed. Right - no drainage observed, no generalized tenderness observed. Nose and Sinuses External Inspection of the Nose - no destructive lesion observed. Inspection of the nares - Left - quiet respiration. Right - quiet respiration. Mouth and Throat Lips - Upper Lip - no fissures observed, no pallor noted. Lower Lip - no fissures observed, no pallor noted. Nasopharynx - no discharge present. Oral Cavity/Oropharynx - Tongue - no dryness observed. Oral Mucosa - no cyanosis observed. Hypopharynx - no evidence of airway distress observed.  Chest and Lung Exam Inspection Movements - Normal and Symmetrical. Accessory muscles - No use of accessory muscles in breathing. Palpation Palpation of the chest reveals - Non-tender. Auscultation Breath sounds - Normal and Clear.  Cardiovascular Auscultation Rhythm -  Regular. Murmurs & Other Heart Sounds - Auscultation of the heart reveals - No Murmurs and No Systolic Clicks.  Abdomen Inspection Inspection of the abdomen reveals - No Visible peristalsis and No Abnormal pulsations. Umbilicus - No Bleeding, No Urine drainage. Palpation/Percussion Palpation and Percussion of the abdomen reveal - Soft, Non Tender, No Rebound tenderness, No Rigidity (guarding) and No Cutaneous hyperesthesia. Note: Abdomen soft. Not severely distended. No distasis recti. No umbilical or other anterior abdominal wall hernias. Healed umbilical & Pfannenstiel incisions  Female Genitourinary Sexual Maturity Tanner 5 - Adult hair pattern. Note: Nontender. No  inguinal hernias. No inguinal lymphadenopathy. No condyloma. No major vaginal bleeding nor foul discharge  Rectal Note: Please refer to endoscopy section. Right lateral and left lateral perianal patches of hyperpigmentation and condyloma.  Peripheral Vascular Upper Extremity Inspection - Left - No Cyanotic nailbeds, Not Ischemic. Right - No Cyanotic nailbeds, Not Ischemic.  Neurologic Neurologic evaluation reveals -normal attention span and ability to concentrate, able to name objects and repeat phrases. Appropriate fund of knowledge , normal sensation and normal coordination. Mental Status Affect - not angry, not paranoid. Cranial Nerves-Normal Bilaterally. Gait-Normal.  Neuropsychiatric Mental status exam performed with findings of-able to articulate well with normal speech/language, rate, volume and coherence, thought content normal with ability to perform basic computations and apply abstract reasoning and no evidence of hallucinations, delusions, obsessions or homicidal/suicidal ideation.  Musculoskeletal Global Assessment Spine, Ribs and Pelvis - no instability, subluxation or laxity. Right Upper Extremity - no instability, subluxation or laxity.  Lymphatic Head & Neck  General Head & Neck  Lymphatics: Bilateral - Description - No Localized lymphadenopathy. Axillary  General Axillary Region: Bilateral - Description - No Localized lymphadenopathy. Femoral & Inguinal  Generalized Femoral & Inguinal Lymphatics: Left - Description - No Localized lymphadenopathy. Right - Description - No Localized lymphadenopathy.   Results Vanessa Sportsman MD; 11/19/2017 3:33 PM) Procedures  Name Value Date Hemorrhoids Procedure Anal exam: condyloma Internal exam: Internal Hemorroids ( non-bleeding) Other: Right and left lateral perianal 3x3cm patches of hyperpigmentation and condyloma. Perianal skin clean with good hygiene. No pruritis ani. No pilonidal disease. No fissure. No abscess/fistula. Normal sphincter tone.  No external hemorrhoids. Tolerates digital and anoscopic rectal exam. No rectal masses. Hemorrhoidal piles Grade 1. Exam done with assistance of female Medical Assistant in the room.  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  I think she would benefit from surgical ablation and/or removal. I think she would do well with laser ablation. Possible excisional biopsy. Outpatient surgery. She lives closer to [place], but is fine with getting surgery done in [place] if she can get done before the end of the year. We will see what options with CO2 LASER are available.  Current Plans Pt Education - [facility] Anal Warts (Marios Gaiser) You are being scheduled for surgery- Our schedulers will call you.  You should hear from our office's scheduling department within 5 working days about the location, date, and time of surgery. We try to make accommodations for patient's preferences in scheduling surgery, but sometimes the OR schedule or the surgeon's schedule prevents [place] from making those accommodations.  If you  have not heard from our office [phone]) in 5 working days, call the office and ask for your surgeon's nurse.  If you have other questions about your diagnosis, plan, or surgery, call the office and ask for your surgeon's nurse.  The anatomy & physiology of the anorectal region was discussed. The pathophysiology of anorectal warts and differential diagnosis was discussed. Natural history risks without surgery was discussed such as further growth and cancer. I stressed the importance of office follow-up to catch early recurrence & minimize/halt progression of disease. Interventions such as cauterization or cryotherapy by topical agents were discussed.  The patient's symptoms are not adequately controlled by non-operative treatments. I feel the risks & problems of no surgery outweigh the operative risks; therefore, I recommended surgery to treat the anal warts by removal,  ablation and/or cauterization.  Risks such as bleeding, infection, need for further treatment, heart attack, death, and other risks were discussed. I noted a good likelihood this will help address the problem. Goals of post-operative recovery were discussed as well. Possibility that this will not correct all symptoms was explained. Post-operative pain, bleeding, constipation, and other problems after surgery were discussed. We will work to minimize complications. Educational handouts further explaining the pathology, treatment options, and bowel regimen were given as well. Questions were answered. The patient expresses understanding & wishes to proceed with surgery.  ANOSCOPY, DIAGNOSTIC (46600) ENCOUNTER FOR PREOPERATIVE EXAMINATION FOR GENERAL SURGICAL PROCEDURE (Z01.818)  Current Plans You are being scheduled for surgery- Our schedulers will call you.  You should hear from our office's scheduling department within 5 working days about the location, date, and time of surgery. We try to make accommodations for  patient's preferences in scheduling surgery, but sometimes the OR schedule or the surgeon's schedule prevents us from making those accommodations.  If you have not heard from our office [phone]) in 5 working days, call the office and ask for your surgeon's nurse.  If you have other questions about your diagnosis, plan, or surgery, call the office and ask for your surgeon's nurse.  Pt Education - [facility] Rectal Prep for Anorectal outpatient/office surgery: discussed with patient and provided information. Pt Education - [facility] Rectal Surgery HCI (Abuk Selleck): discussed with patient and provided information. Pt Education - [facility] Good Bowel Health (Tresten Pantoja)  Vanessa SportsmanSteven C. Alaja Goldinger, M.D., F.A.C.S. Gastrointestinal and Minimally Invasive Surgery [facility] [address] [place], [place] [id number] [phone] Main / Paging

## 2017-12-10 ENCOUNTER — Other Ambulatory Visit: Payer: Self-pay

## 2017-12-10 ENCOUNTER — Encounter (HOSPITAL_BASED_OUTPATIENT_CLINIC_OR_DEPARTMENT_OTHER): Payer: Self-pay

## 2017-12-10 NOTE — Progress Notes (Signed)
Spoke with Vanessa Greer NPO after midnight, also no smoking after midnight.  Clear liquids beginning 12/12/17 will also do bowel prep staring at 1PM 12/12/17.  She will use hibiclen the night before surgery and the morning of surgery.  Arrival time 6:45 AM.  She will need a Hemoglobin DOS.  She will take the following medication DOS:  Lyrica and Pantoprazole.  Pre op orders are in epic. Chanetta MarshallJimmy (Husband) [phone] will accompany her DOS and be her ride home from surgery.

## 2017-12-13 ENCOUNTER — Encounter (HOSPITAL_BASED_OUTPATIENT_CLINIC_OR_DEPARTMENT_OTHER): Payer: Self-pay | Admitting: *Deleted

## 2017-12-13 ENCOUNTER — Encounter (HOSPITAL_BASED_OUTPATIENT_CLINIC_OR_DEPARTMENT_OTHER)
Admission: RE | Disposition: A | Discharge status: Home / Self Care | Payer: Self-pay | Source: Ambulatory Visit | Attending: Surgery

## 2017-12-13 ENCOUNTER — Ambulatory Visit (HOSPITAL_BASED_OUTPATIENT_CLINIC_OR_DEPARTMENT_OTHER): Payer: BC Managed Care – PPO | Admitting: Anesthesiology

## 2017-12-13 ENCOUNTER — Ambulatory Visit (HOSPITAL_BASED_OUTPATIENT_CLINIC_OR_DEPARTMENT_OTHER)
Admission: RE | Admit: 2017-12-13 | Discharge: 2017-12-13 | Disposition: A | Discharge status: Home / Self Care | Payer: BC Managed Care – PPO | Source: Ambulatory Visit | Attending: Surgery | Admitting: Surgery

## 2017-12-13 ENCOUNTER — Other Ambulatory Visit: Payer: Self-pay

## 2017-12-13 DIAGNOSIS — E7800 Pure hypercholesterolemia, unspecified: Secondary | ICD-10-CM | POA: Diagnosis not present

## 2017-12-13 DIAGNOSIS — F17210 Nicotine dependence, cigarettes, uncomplicated: Secondary | ICD-10-CM | POA: Insufficient documentation

## 2017-12-13 DIAGNOSIS — Z803 Family history of malignant neoplasm of breast: Secondary | ICD-10-CM | POA: Insufficient documentation

## 2017-12-13 DIAGNOSIS — M5136 Other intervertebral disc degeneration, lumbar region: Secondary | ICD-10-CM | POA: Insufficient documentation

## 2017-12-13 DIAGNOSIS — Z79899 Other long term (current) drug therapy: Secondary | ICD-10-CM | POA: Diagnosis not present

## 2017-12-13 DIAGNOSIS — K642 Third degree hemorrhoids: Secondary | ICD-10-CM | POA: Diagnosis not present

## 2017-12-13 DIAGNOSIS — F329 Major depressive disorder, single episode, unspecified: Secondary | ICD-10-CM | POA: Insufficient documentation

## 2017-12-13 DIAGNOSIS — F419 Anxiety disorder, unspecified: Secondary | ICD-10-CM | POA: Insufficient documentation

## 2017-12-13 DIAGNOSIS — Z90710 Acquired absence of both cervix and uterus: Secondary | ICD-10-CM | POA: Diagnosis not present

## 2017-12-13 DIAGNOSIS — K219 Gastro-esophageal reflux disease without esophagitis: Secondary | ICD-10-CM | POA: Diagnosis not present

## 2017-12-13 DIAGNOSIS — A630 Anogenital (venereal) warts: Secondary | ICD-10-CM

## 2017-12-13 HISTORY — PX: LASER ABLATION CONDOLAMATA: SHX5941

## 2017-12-13 HISTORY — DX: Radiculopathy, lumbar region: M54.16

## 2017-12-13 HISTORY — DX: Other intervertebral disc degeneration, lumbar region without mention of lumbar back pain or lower extremity pain: M51.369

## 2017-12-13 HISTORY — DX: Other intervertebral disc degeneration, lumbar region: M51.36

## 2017-12-13 LAB — POCT HEMOGLOBIN-HEMACUE: HEMOGLOBIN: 14.3 g/dL (ref 12.0–15.0)

## 2017-12-13 SURGERY — ABLATION, CONDYLOMA, USING LASER
Anesthesia: General

## 2017-12-13 MED ORDER — LACTATED RINGERS IV SOLN
INTRAVENOUS | Status: DC
  Administered 2017-12-13: 08:00:00 via INTRAVENOUS
  Filled 2017-12-13: qty 1000

## 2017-12-13 MED ORDER — DEXAMETHASONE SODIUM PHOSPHATE 10 MG/ML IJ SOLN
INTRAMUSCULAR | Status: AC
  Filled 2017-12-13: qty 1

## 2017-12-13 MED ORDER — CYCLOBENZAPRINE HCL 10 MG PO TABS
10.0000 mg | ORAL_TABLET | Freq: Two times a day (BID) | ORAL | Status: DC | PRN
  Filled 2017-12-13: qty 1

## 2017-12-13 MED ORDER — GABAPENTIN 300 MG PO CAPS
300.0000 mg | ORAL_CAPSULE | ORAL | Status: AC
  Administered 2017-12-13: 300 mg via ORAL
  Filled 2017-12-13: qty 1

## 2017-12-13 MED ORDER — FENTANYL CITRATE (PF) 100 MCG/2ML IJ SOLN
INTRAMUSCULAR | Status: AC
  Filled 2017-12-13: qty 2

## 2017-12-13 MED ORDER — GLYCOPYRROLATE 0.2 MG/ML IJ SOLN
INTRAMUSCULAR | Status: DC | PRN
  Administered 2017-12-13: 0.2 mg via INTRAVENOUS

## 2017-12-13 MED ORDER — HYDROCODONE-ACETAMINOPHEN 7.5-325 MG PO TABS: 1.0000 | ORAL_TABLET | Freq: Four times a day (QID) | ORAL | 0 refills | Status: DC | PRN

## 2017-12-13 MED ORDER — ACETIC ACID 5 % SOLN
Status: DC | PRN
Start: 2017-12-13 — End: 2017-12-13
  Administered 2017-12-13: 1 via TOPICAL

## 2017-12-13 MED ORDER — DEXAMETHASONE SODIUM PHOSPHATE 4 MG/ML IJ SOLN
INTRAMUSCULAR | Status: DC | PRN
Start: 2017-12-13 — End: 2017-12-13
  Administered 2017-12-13: 10 mg via INTRAVENOUS

## 2017-12-13 MED ORDER — BUPIVACAINE LIPOSOME 1.3 % IJ SUSP
INTRAMUSCULAR | Status: DC | PRN
  Administered 2017-12-13: 20 mL

## 2017-12-13 MED ORDER — ONDANSETRON HCL 4 MG/2ML IJ SOLN
INTRAMUSCULAR | Status: DC | PRN
  Administered 2017-12-13: 4 mg via INTRAVENOUS

## 2017-12-13 MED ORDER — PROPOFOL 10 MG/ML IV BOLUS
INTRAVENOUS | Status: AC
  Filled 2017-12-13: qty 20

## 2017-12-13 MED ORDER — KETOROLAC TROMETHAMINE 30 MG/ML IJ SOLN
INTRAMUSCULAR | Status: DC | PRN
  Administered 2017-12-13: 30 mg via INTRAVENOUS

## 2017-12-13 MED ORDER — PROMETHAZINE HCL 25 MG/ML IJ SOLN
6.2500 mg | INTRAMUSCULAR | Status: DC | PRN
  Filled 2017-12-13: qty 1

## 2017-12-13 MED ORDER — ACETAMINOPHEN 325 MG PO TABS
325.0000 mg | ORAL_TABLET | Freq: Four times a day (QID) | ORAL | Status: DC | PRN
  Filled 2017-12-13: qty 2

## 2017-12-13 MED ORDER — LIDOCAINE 2% (20 MG/ML) 5 ML SYRINGE
INTRAMUSCULAR | Status: AC
  Filled 2017-12-13: qty 5

## 2017-12-13 MED ORDER — GLYCOPYRROLATE 0.2 MG/ML IV SOSY
PREFILLED_SYRINGE | INTRAVENOUS | Status: AC
  Filled 2017-12-13: qty 5

## 2017-12-13 MED ORDER — BUPIVACAINE-EPINEPHRINE (PF) 0.25% -1:200000 IJ SOLN
INTRAMUSCULAR | Status: DC | PRN
  Administered 2017-12-13: 30 mL via PERINEURAL

## 2017-12-13 MED ORDER — HYDROCODONE-ACETAMINOPHEN 7.5-325 MG PO TABS
ORAL_TABLET | ORAL | Status: AC
  Filled 2017-12-13: qty 1

## 2017-12-13 MED ORDER — MIDAZOLAM HCL 2 MG/2ML IJ SOLN
INTRAMUSCULAR | Status: AC
  Filled 2017-12-13: qty 2

## 2017-12-13 MED ORDER — HYDROCODONE-ACETAMINOPHEN 7.5-325 MG PO TABS
1.0000 | ORAL_TABLET | Freq: Four times a day (QID) | ORAL | Status: DC | PRN
  Administered 2017-12-13: 1 via ORAL
  Filled 2017-12-13: qty 2

## 2017-12-13 MED ORDER — SUCCINYLCHOLINE CHLORIDE 20 MG/ML IJ SOLN
INTRAMUSCULAR | Status: DC | PRN
  Administered 2017-12-13: 100 mg via INTRAVENOUS

## 2017-12-13 MED ORDER — GABAPENTIN 300 MG PO CAPS
ORAL_CAPSULE | ORAL | Status: AC
  Filled 2017-12-13: qty 1

## 2017-12-13 MED ORDER — CELECOXIB 200 MG PO CAPS
ORAL_CAPSULE | ORAL | Status: AC
  Filled 2017-12-13: qty 1

## 2017-12-13 MED ORDER — CHLORHEXIDINE GLUCONATE CLOTH 2 % EX PADS
6.0000 | MEDICATED_PAD | Freq: Once | CUTANEOUS | Status: DC
  Filled 2017-12-13: qty 6

## 2017-12-13 MED ORDER — CEFAZOLIN SODIUM-DEXTROSE 2-4 GM/100ML-% IV SOLN
INTRAVENOUS | Status: AC
  Filled 2017-12-13: qty 100

## 2017-12-13 MED ORDER — FENTANYL CITRATE (PF) 100 MCG/2ML IJ SOLN
INTRAMUSCULAR | Status: DC | PRN
  Administered 2017-12-13 (×2): 25 ug via INTRAVENOUS
  Administered 2017-12-13: 50 ug via INTRAVENOUS
  Administered 2017-12-13 (×2): 25 ug via INTRAVENOUS
  Administered 2017-12-13: 50 ug via INTRAVENOUS

## 2017-12-13 MED ORDER — LIDOCAINE HCL (CARDIAC) 20 MG/ML IV SOLN
INTRAVENOUS | Status: DC | PRN
  Administered 2017-12-13: 100 mg via INTRAVENOUS

## 2017-12-13 MED ORDER — PROPOFOL 10 MG/ML IV BOLUS
INTRAVENOUS | Status: DC | PRN
  Administered 2017-12-13: 200 mg via INTRAVENOUS

## 2017-12-13 MED ORDER — KETOROLAC TROMETHAMINE 30 MG/ML IJ SOLN
INTRAMUSCULAR | Status: AC
  Filled 2017-12-13: qty 1

## 2017-12-13 MED ORDER — MIDAZOLAM HCL 5 MG/5ML IJ SOLN
INTRAMUSCULAR | Status: DC | PRN
  Administered 2017-12-13: 2 mg via INTRAVENOUS

## 2017-12-13 MED ORDER — METRONIDAZOLE IN NACL 5-0.79 MG/ML-% IV SOLN
500.0000 mg | INTRAVENOUS | Status: AC
  Administered 2017-12-13: 500 mg via INTRAVENOUS
  Filled 2017-12-13 (×2): qty 100

## 2017-12-13 MED ORDER — FENTANYL CITRATE (PF) 100 MCG/2ML IJ SOLN
25.0000 ug | INTRAMUSCULAR | Status: DC | PRN
  Administered 2017-12-13: 50 ug via INTRAVENOUS
  Filled 2017-12-13: qty 1

## 2017-12-13 MED ORDER — HYDROCODONE-ACETAMINOPHEN 7.5-325 MG PO TABS
1.0000 | ORAL_TABLET | ORAL | Status: DC | PRN
  Filled 2017-12-13: qty 2

## 2017-12-13 MED ORDER — CEFAZOLIN SODIUM-DEXTROSE 2-4 GM/100ML-% IV SOLN
2.0000 g | INTRAVENOUS | Status: AC
  Administered 2017-12-13: 2 g via INTRAVENOUS
  Filled 2017-12-13: qty 100

## 2017-12-13 MED ORDER — ACETAMINOPHEN 500 MG PO TABS
1000.0000 mg | ORAL_TABLET | ORAL | Status: AC
  Administered 2017-12-13: 1000 mg via ORAL
  Filled 2017-12-13: qty 2

## 2017-12-13 MED ORDER — CELECOXIB 200 MG PO CAPS
200.0000 mg | ORAL_CAPSULE | ORAL | Status: AC
  Administered 2017-12-13: 200 mg via ORAL
  Filled 2017-12-13: qty 1

## 2017-12-13 MED ORDER — ONDANSETRON HCL 4 MG/2ML IJ SOLN
INTRAMUSCULAR | Status: AC
  Filled 2017-12-13: qty 2

## 2017-12-13 MED ORDER — ACETAMINOPHEN 500 MG PO TABS
ORAL_TABLET | ORAL | Status: AC
  Filled 2017-12-13: qty 2

## 2017-12-13 MED ORDER — WITCH HAZEL-GLYCERIN EX PADS
1.0000 "application " | MEDICATED_PAD | CUTANEOUS | Status: DC | PRN
  Filled 2017-12-13: qty 100

## 2017-12-13 MED ORDER — CLONAZEPAM 1 MG PO TABS
1.0000 mg | ORAL_TABLET | Freq: Every day | ORAL | Status: DC
  Filled 2017-12-13: qty 1

## 2017-12-13 MED ORDER — BUPIVACAINE LIPOSOME 1.3 % IJ SUSP
20.0000 mL | INTRAMUSCULAR | Status: DC
  Filled 2017-12-13: qty 20

## 2017-12-13 MED ORDER — HYDROCORTISONE ACE-PRAMOXINE 2.5-1 % RE CREA
1.0000 "application " | TOPICAL_CREAM | Freq: Four times a day (QID) | RECTAL | Status: DC | PRN
  Filled 2017-12-13: qty 30

## 2017-12-13 SURGICAL SUPPLY — 39 items
BENZOIN TINCTURE PRP APPL 2/3 (GAUZE/BANDAGES/DRESSINGS) ×2 IMPLANT
BLADE SURG 15 STRL LF DISP TIS (BLADE) IMPLANT
BLADE SURG 15 STRL SS (BLADE)
BRIEF STRETCH FOR OB PAD LRG (UNDERPADS AND DIAPERS) ×4 IMPLANT
COVER BACK TABLE 60X90IN (DRAPES) ×2 IMPLANT
COVER MAYO STAND STRL (DRAPES) ×2 IMPLANT
DERMABOND ADVANCED (GAUZE/BANDAGES/DRESSINGS)
DERMABOND ADVANCED .7 DNX12 (GAUZE/BANDAGES/DRESSINGS) IMPLANT
DRAPE LAPAROTOMY 100X72 PEDS (DRAPES) ×4 IMPLANT
DRAPE UTILITY XL STRL (DRAPES) ×2 IMPLANT
ELECT NEEDLE BLADE 2-5/6 (NEEDLE) ×2 IMPLANT
ELECT REM PT RETURN 9FT ADLT (ELECTROSURGICAL) ×2
ELECTRODE REM PT RTRN 9FT ADLT (ELECTROSURGICAL) ×1 IMPLANT
GAUZE SPONGE 4X4 12PLY STRL LF (GAUZE/BANDAGES/DRESSINGS) ×2 IMPLANT
GLOVE BIOGEL PI IND STRL 8 (GLOVE) ×1 IMPLANT
GLOVE BIOGEL PI INDICATOR 8 (GLOVE) ×1
GLOVE ECLIPSE 8.0 STRL XLNG CF (GLOVE) ×2 IMPLANT
GOWN STRL REUS W/ TWL LRG LVL3 (GOWN DISPOSABLE) ×1 IMPLANT
GOWN STRL REUS W/ TWL XL LVL3 (GOWN DISPOSABLE) ×1 IMPLANT
GOWN STRL REUS W/TWL LRG LVL3 (GOWN DISPOSABLE) ×1
GOWN STRL REUS W/TWL XL LVL3 (GOWN DISPOSABLE) ×1
KIT RM TURNOVER CYSTO AR (KITS) ×2 IMPLANT
PACK BASIN DAY SURGERY FS (CUSTOM PROCEDURE TRAY) ×2 IMPLANT
PAD ABD 8X10 STRL (GAUZE/BANDAGES/DRESSINGS) ×2 IMPLANT
PENCIL BUTTON HOLSTER BLD 10FT (ELECTRODE) ×2 IMPLANT
SHEET MEDIUM DRAPE 40X70 STRL (DRAPES) ×2 IMPLANT
SURGILUBE 2OZ TUBE FLIPTOP (MISCELLANEOUS) ×2 IMPLANT
SUT CHROMIC 3 0 SH 27 (SUTURE) ×2 IMPLANT
SUT VIC AB 2-0 UR6 27 (SUTURE) ×6 IMPLANT
SUT VIC AB 3-0 SH 18 (SUTURE) IMPLANT
SYR CONTROL 10ML LL (SYRINGE) ×2 IMPLANT
TOWEL OR 17X24 6PK STRL BLUE (TOWEL DISPOSABLE) ×4 IMPLANT
TRAY DSU PREP LF (CUSTOM PROCEDURE TRAY) ×2 IMPLANT
TUBE CONNECTING 12X1/4 (SUCTIONS) ×2 IMPLANT
UNDERPAD 30X30 INCONTINENT (UNDERPADS AND DIAPERS) ×2 IMPLANT
VACUUM HOSE/TUBING 7/8INX6FT (MISCELLANEOUS) ×2 IMPLANT
WATER STERILE IRR 1000ML POUR (IV SOLUTION) ×2 IMPLANT
WATER STERILE IRR 500ML POUR (IV SOLUTION) ×4 IMPLANT
YANKAUER SUCT BULB TIP NO VENT (SUCTIONS) ×2 IMPLANT

## 2017-12-13 NOTE — Transfer of Care (Signed)
Immediate Anesthesia Transfer of Care Note  Patient: Vanessa Greer  Procedure(s) Performed: Procedure(s) (LRB): LASER REMOVAL ABLATION OF CONDYLOMATA, EXAMINATION UNDER ANESTHESIA, HEMORRIDAL LIGATION AND PEXY (N/A)  Patient Location: PACU  Anesthesia Type: General  Level of Consciousness: awake, sedated, patient cooperative and responds to stimulation  Airway & Oxygen Therapy: Patient Spontanous Breathing and Patient connected to [place] oxygen  Post-op Assessment: Report given to PACU RN, Post -op Vital signs reviewed and stable and Patient moving all extremities  Post vital signs: Reviewed and stable  Complications: No apparent anesthesia complications

## 2017-12-13 NOTE — Anesthesia Preprocedure Evaluation (Addendum)
Anesthesia Evaluation  Patient identified by MRN, date of birth, ID band Patient awake    Reviewed: Allergy & Precautions, NPO status , Patient's Chart, lab work & pertinent test results  History of Anesthesia Complications Negative for: history of anesthetic complications  Airway Mallampati: II  TM Distance: >3 FB     Dental no notable dental hx. (+) Dental Advisory Given   Pulmonary neg pulmonary ROS, Current Smoker,    Pulmonary exam normal        Cardiovascular negative cardio ROS Normal cardiovascular exam     Neuro/Psych PSYCHIATRIC DISORDERS Anxiety Depression negative neurological ROS     GI/Hepatic Neg liver ROS, GERD  Controlled and Medicated,  Endo/Other  negative endocrine ROS  Renal/GU negative Renal ROS  negative genitourinary   Musculoskeletal negative musculoskeletal ROS (+)   Abdominal   Peds negative pediatric ROS (+)  Hematology negative hematology ROS (+)   Anesthesia Other Findings   Reproductive/Obstetrics negative OB ROS                            Anesthesia Physical Anesthesia Plan  ASA: II  Anesthesia Plan: General   Post-op Pain Management:    Induction: Intravenous  PONV Risk Score and Plan: 3 and Ondansetron, Dexamethasone and Scopolamine patch - Pre-op  Airway Management Planned: LMA and Oral ETT  Additional Equipment:   Intra-op Plan:   Post-operative Plan: Extubation in OR  Informed Consent: I have reviewed the patients History and Physical, chart, labs and discussed the procedure including the risks, benefits and alternatives for the proposed anesthesia with the patient or authorized representative who has indicated his/her understanding and acceptance.   Dental advisory given  Plan Discussed with: CRNA and Anesthesiologist  Anesthesia Plan Comments:        Anesthesia Quick Evaluation

## 2017-12-13 NOTE — Anesthesia Procedure Notes (Signed)
Procedure Name: Intubation Date/Time: 12/13/2017 8:45 AM Performed by: Justice Rocher, CRNA Pre-anesthesia Checklist: Patient identified, Emergency Drugs available, Suction available and Patient being monitored Patient Re-evaluated:Patient Re-evaluated prior to induction Oxygen Delivery Method: Circle system utilized Preoxygenation: Pre-oxygenation with 100% oxygen Induction Type: IV induction Ventilation: Mask ventilation without difficulty Laryngoscope Size: Mac and 3 Grade View: Grade II Tube type: Oral Tube size: 7.0 mm Number of attempts: 1 Airway Equipment and Method: Stylet and Oral airway Placement Confirmation: ETT inserted through vocal cords under direct vision,  positive ETCO2 and breath sounds checked- equal and bilateral Secured at: 21 cm Tube secured with: Tape Dental Injury: Teeth and Oropharynx as per pre-operative assessment

## 2017-12-13 NOTE — Anesthesia Postprocedure Evaluation (Signed)
Anesthesia Post Note  Patient: Vanessa Greer  Procedure(s) Performed: LASER REMOVAL ABLATION OF CONDYLOMATA, EXAMINATION UNDER ANESTHESIA, HEMORRIDAL LIGATION AND PEXY (N/A )     Patient location during evaluation: PACU Anesthesia Type: General Level of consciousness: sedated Pain management: pain level controlled Vital Signs Assessment: post-procedure vital signs reviewed and stable Respiratory status: spontaneous breathing and respiratory function stable Cardiovascular status: stable Postop Assessment: no apparent nausea or vomiting Anesthetic complications: no    Last Vitals:  Vitals:   12/13/17 1120 12/13/17 1230  BP: 130/73 136/64  Pulse: 87 88  Resp: 14 14  Temp: 36.4 C   SpO2: 100% 100%    Last Pain:  Vitals:   12/13/17 1230  TempSrc:   PainSc: 4                  Vanessa Greer DANIEL

## 2017-12-13 NOTE — Op Note (Signed)
12/13/2017  9:46 AM  PATIENT:  Adan SisLinda D Gagan  56 y.o. 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 Cassidy Tashiro, MD  ASSISTANT: RN   ANESTHESIA:     General  Anorectal block using 0.25% bupivicaine with epinephrine at the beginning of case and then follow up at the end with liposomal bupivacaine (Experel)  EBL:  Total I/O In: 200 [I.V.:200] Out: 15 [Blood:15]  Delay start of Pharmacological VTE agent (>24hrs) due to surgical blood loss or risk of bleeding:  no  DRAINS: none   SPECIMEN:  No Specimen  DISPOSITION OF SPECIMEN:  N/A  COUNTS:  YES  PLAN OF CARE: Discharge to home after PACU  PATIENT DISPOSITION:  PACU - hemodynamically stable.  INDICATION: Patient with perianal condyloma.  Burden felt too much to treat in office.  Recommendation made for examination under anesthesia with ablation and possible removal of condyloma.    The anatomy & physiology of the anorectal region was discussed.  The pathophysiology of anorectal warts and differential diagnosis was discussed.  Natural history risks without surgery was discussed such as further growth and cancer.   I stressed the importance of office follow-up to catch early recurrence & minimize/halt progression of disease.  Interventions such as cauterization by topical agents were discussed.  The patient's symptoms are not adequately controlled by non-operative  treatments.  I feel the risks & problems of no surgery outweigh the operative risks; therefore, I recommended surgery to treat the anal warts by removal, ablation and/or cauterization.  Risks such as bleeding, infection, need for further treatment, heart attack, death, and other risks were discussed.   I noted a good likelihood this will help address the problem. Goals of post-operative recovery were discussed as well.  Possibility that this will not correct all symptoms was explained.  Post-operative pain, bleeding, constipation, and other problems after surgery were discussed.  We will work to minimize complications.   Educational handouts further explaining the pathology, treatment options, and bowel regimen were given as well.  Questions were answered.  The patient expresses understanding & wishes to proceed with surgery.  OR FINDINGS: Perianal bilateral condyloma and patches of hypo-and hyperpigmentation..  No evidence of ulceration for anal intraepithelial neoplasia.  Ablated with laser.  Some pruritus ani with three pile internal hemorrhoids enlarged.  Prolapsing grade 3 - Left lateral and right anterior.  Right posterior grade 2.   Ligation and pexy of hemorrhoidal columns done.  Improved anatomy.  No resection done.  DESCRIPTION:   Informed consent was confirmed. Patient underwent general anesthesia without difficulty. Patient was placed into prone positioning.  The perianal region was prepped and draped in sterile fashion. Surgical time-out confirmed our plan.  I did digital rectal examination and then transitioned over to anoscopy to get a sense of the anatomy.   I also did staining with acetic acid. Findings noted above.  Had hypo-and hyperpigmentation on lateral perianal regions in a butterfly pattern with central condyloma.  I proceeded to ablate the condyloma using the CO2 laser.  Set  to 7 W initially.  Increased to 10 W.  I worked peripherally and then into anal canal.  No major  abnormalities palpated but some white areas with staining ablated in the anal canal.  I repeated inspection.  Did ascitic staining.  Did repeat ablation on suspicious areas.  Hemostasis good.  Because of the enlarged hemorrhoidal piles I did do hemorrhoidal ligation.  I used a 2-0 Vicryl suture on a UR-6 needle in a figure-of-eight fashion 6 cm proximal to the anal verge.  I started at the largest hemorrhoid pile. I then ran that stitch longitudinally to the white line of Hinton over a large Hill-Furgeson retractor to avoid narrowing of the anal canal.  I then tied that stitch down to cause a hemorrhoidopexy.   I then did hemorrhoidal ligation and pexy at the other columns.  At the completion of this, all 6 anorectal columns were ligated and pexied in the classic hexagonal fashion (right anterior/lateral/posterior, left anterior/lateral/posterior).    I repeated anoscopy and examination.  Anatomy improved.  Hemostasis was good.  No other abnormalities noted.  No fissure.  No fistula.  No tumor.  No abscess.  No true rectal proxccidentia prolapse.  Topical dibucaine placed over perianal region with fluff gauze.  Patient being extubated to go to the recovery room.  I had discussed postop care in detail with the patient in the preop holding area.  Instructions for post-operative recovery had been given in the office and moral be given at discharge.  Prescription for pain medicine written. I discussed operative findings, updated the patient's status, discussed probable steps to recovery, and gave postoperative recommendations to the patient's spouse.  Recommendations were made.  Questions were answered.  He expressed understanding & appreciation.  Ardeth SportsmanSteven C. Riona Lahti, M.D., F.A.C.S. Gastrointestinal and Minimally Invasive Surgery [facility] [address] [place], [place] [id number] [phone] Main / Paging

## 2017-12-13 NOTE — Interval H&P Note (Signed)
History and Physical Interval Note:  12/13/2017 8:08 AM  Vanessa Greer  has presented today for surgery, with the diagnosis of CONDYLOMA ACUMINATUM OF PERIANAL REGION  The various methods of treatment have been discussed with the patient and family. After consideration of risks, benefits and other options for treatment, the patient has consented to  Procedure(s) with comments: LASER REMOVAL ABLATION OF CONDYLOMATA, EXAMINATION UNDER ANESTHESIA (N/A) - GENERAL AND LOCAL as a surgical intervention .  The patient's history has been reviewed, patient examined, no change in status, stable for surgery.  I have reviewed the patient's chart and labs.  Questions were answered to the patient's satisfaction.    I have re-reviewed the the patient's records, history, medications, and allergies.  I have re-examined the patient.  I again discussed intraoperative plans and goals of post-operative recovery.  The patient agrees to proceed.  Vanessa Greer  12/20/1961 [id number]  Patient Care Team: Jaclyn Shaggyate, Denny C, MD as PCP - General (Internal Medicine) Karie SodaGross, Jaydon Avina, MD as Consulting Physician (General Surgery) Christena DeemSkulskie, Martin U, MD as Consulting Physician (Gastroenterology) Haverstock, Elvin Sohristina L, MD as Referring Physician (Dermatology)  There are no active problems to display for this patient.   Past Medical History:  Diagnosis Date  . Anxiety   . Back pain   . DDD (degenerative disc disease), lumbar   . Depression   . GERD (gastroesophageal reflux disease)   . High cholesterol   . Lumbar radiculitis   . Pneumothorax 1982    Past Surgical History:  Procedure Laterality Date  . ABDOMINAL HYSTERECTOMY    . COLONOSCOPY    . ECTOPIC PREGNANCY SURGERY    . FOOT SURGERY      Social History   Socioeconomic History  . Marital status: Married    Spouse name: Not on file  . Number of children: Not on file  . Years of education: Not on file  . Highest education level: Not on file  Social  Needs  . Financial resource strain: Not on file  . Food insecurity - worry: Not on file  . Food insecurity - inability: Not on file  . Transportation needs - medical: Not on file  . Transportation needs - non-medical: Not on file  Occupational History  . Not on file  Tobacco Use  . Smoking status: Current Every Day Smoker    Packs/day: 0.25    Years: 35.00    Pack years: 8.75    Types: Cigarettes  . Smokeless tobacco: Never Used  Substance and Sexual Activity  . Alcohol use: Yes    Alcohol/week: 0.0 oz    Comment: 3 oz red wine daily  . Drug use: No  . Sexual activity: Not on file  Other Topics Concern  . Not on file  Social History Narrative  . Not on file    Family History  Problem Relation Age of Onset  . Breast cancer Mother 455    Medications Prior to Admission  Medication Sig Dispense Refill Last Dose  . Cholecalciferol (VITAMIN D3) 3000 units TABS Take 1,000 Units by mouth daily.   12/12/2017 at 0900  . clonazePAM (KLONOPIN) 1 MG tablet 1 mg at bedtime.   0 12/12/2017 at 1930  . HYDROcodone-acetaminophen (NORCO) 7.5-325 MG per tablet take 1 tablet by mouth every 6 hours if needed for back pain  0 12/12/2017 at 1800  . LYRICA 150 MG capsule 150 mg 2 (two) times daily.   0 12/13/2017 at 0530  . Multiple Vitamin (MULTIVITAMIN  WITH MINERALS) TABS tablet Take 1 tablet by mouth daily.   12/12/2017 at 1930  . Omega-3 Fatty Acids (OMEGA 3 500 PO) Take by mouth daily.   12/12/2017 at 0900  . pantoprazole (PROTONIX) 40 MG tablet Take 40 mg by mouth daily.  0 12/13/2017 at 0530  . sertraline (ZOLOFT) 50 MG tablet 100 mg daily.   0 12/12/2017 at 1700  . simvastatin (ZOCOR) 40 MG tablet 40 mg daily at 6 PM.   0 12/12/2017 at 1930  . Turmeric 500 MG CAPS Take by mouth daily.   12/12/2017 at 1930  . cyclobenzaprine (FLEXERIL) 10 MG tablet Take 10 mg by mouth 2 (two) times daily as needed for muscle spasms.   More than a month at Unknown time    Current Facility-Administered  Medications  Medication Dose Route Frequency Provider Last Rate Last Dose  . bupivacaine liposome (EXPAREL) 1.3 % injection 266 mg  20 mL Infiltration On Call to OR Karie SodaGross, Krist Rosenboom, MD      . ceFAZolin (ANCEF) IVPB 2g/100 mL premix  2 g Intravenous On Call to OR Karie SodaGross, Starlena Beil, MD       And  . metroNIDAZOLE (FLAGYL) IVPB 500 mg  500 mg Intravenous On Call to OR Karie SodaGross, Azelia Reiger, MD      . Chlorhexidine Gluconate Cloth 2 % PADS 6 each  6 each Topical Once Karie SodaGross, Marilena Trevathan, MD       And  . Chlorhexidine Gluconate Cloth 2 % PADS 6 each  6 each Topical Once Karie SodaGross, Kaizlee Carlino, MD      . lactated ringers infusion   Intravenous Continuous Heather RobertsSinger, James, MD 50 mL/hr at 12/13/17 0740       No Known Allergies  BP 125/65   Pulse 81   Temp 98.5 F (36.9 C) (Oral)   Resp 16   Ht 5\' 6"  (1.676 m)   Wt 71.9 kg (158 lb 8 oz)   SpO2 98%   BMI 25.58 kg/m   Labs: Results for orders placed or performed during the hospital encounter of 12/13/17 (from the past 48 hour(s))  Hemoglobin-hemacue, POC     Status: None   Collection Time: 12/13/17  8:03 AM  Result Value Ref Range   Hemoglobin 14.3 12.0 - 15.0 g/dL    Imaging / Studies: No results found.   [name].Camila Norville C. Eldra Word, M.D., F.A.C.S. Gastrointestinal and Minimally Invasive Surgery [facility] [address] [place], [place] [id number] [phone] Main / Paging  12/13/2017 8:11 AM    Ardeth SportsmanSteven C Lylian Sanagustin

## 2017-12-13 NOTE — Discharge Instructions (Signed)
ANORECTAL SURGERY:  °POST OPERATIVE INSTRUCTIONS ° °###################################################################### ° °EAT °Start with a pureed / full liquid diet °After 24 hours, gradually transition to a high fiber diet.   ° °CONTROL PAIN °Control pain so you can tolerate bowel movements,  °walk, sleep, tolerate sneezing/coughing, and go up/down stairs. ° ° °HAVE A BOWEL MOVEMENT DAILY °Keep your bowels regular to avoid problems.   °Taking a fiber supplement every day to keep bowels soft.   °Try a laxative to override constipation. °Use an antidairrheal to slow down diarrhea.   °Call if not better after 2 tries ° °WALK °Walk an hour a day.  Control your pain to do that. °  °CALL IF YOU HAVE PROBLEMS/CONCERNS °Call if you are still struggling despite following these instructions. °Call if you have concerns not answered by these instructions ° °###################################################################### ° ° ° °1. Take your usually prescribed home medications unless otherwise directed. °2. DIET: Follow a light bland diet the first 24 hours after arrival home, such as soup, liquids, crackers, etc.  Be sure to include lots of fluids daily.  Avoid fast food or heavy meals as your are more likely to get nauseated.  Eat a low fat the next few days after surgery.   °3. PAIN CONTROL: °a. Pain is best controlled by a usual combination of three different methods TOGETHER: °i. Ice/Heat °ii. Over the counter pain medication °iii. Prescription pain medication °b. Expect swelling and discomfort in the anus/rectal area.  Warm water baths (30-60 minutes up to 6 times a day, especially after bowel meovements) will help. Use ice for the first few days to help decrease swelling and bruising, then switch to heat such as warm towels, sitz baths, warm baths, etc to help relax tight/sore spots and speed recovery.  Some people prefer to use ice alone, heat alone, alternating between ice & heat.  Experiment to what works  for you.   °c. It is helpful to take an over-the-counter pain medication regularly for the first few weeks.  Choose one of the following that works best for you: °i. Naproxen (Aleve, etc)  Two 220mg tabs twice a day °ii. Ibuprofen (Advil, etc) Three 200mg tabs four times a day (every meal & bedtime) °iii. Acetaminophen (Tylenol, etc) 500-650mg four times a day (every meal & bedtime) °d. A  prescription for pain medication (such as oxycodone, hydrocodone, etc) should be given to you upon discharge.  Take your pain medication as prescribed.  °i. If you are having problems/concerns with the prescription medicine (does not control pain, nausea, vomiting, rash, itching, etc), please call us [phone] to see if we need to switch you to a different pain medicine that will work better for you and/or control your side effect better. °ii. If you need a refill on your pain medication, please contact your pharmacy.  They will contact our office to request authorization. Prescriptions will not be filled after 5 pm or on week-ends. ° °Use a Sitz Bath 4-8 times a day for relief ° ° °Sitz Bath °A sitz bath is a warm water bath taken in the sitting position that covers only the hips and buttocks. It may be used for either healing or hygiene purposes. Sitz baths are also used to relieve pain, itching, or muscle spasms. The water may contain medicine. Moist heat will help you heal and relax.  °HOME CARE INSTRUCTIONS  °Take 3 to 4 sitz baths a day. °1. Fill the bathtub half full with warm water. °2. Sit in the   water and open the drain a little. °3. Turn on the warm water to keep the tub half full. Keep the water running constantly. °4. Soak in the water for 15 to 20 minutes. °5. After the sitz bath, pat the affected area dry first. ° ° °4. KEEP YOUR BOWELS REGULAR °a. The goal is one bowel movement a day °b. Avoid getting constipated.  Between the surgery and the pain medications, it is common to experience some constipation.   Increasing fluid intake and taking a fiber supplement (such as Metamucil, Citrucel, FiberCon, MiraLax, etc) 2-3 times a day regularly will usually help prevent this problem from occurring.  A mild laxative (prune juice, Milk of Magnesia, MiraLax, etc) should be taken according to package directions if there are no bowel movements after 48 hours. °c. Watch out for diarrhea.  If you have many loose bowel movements, simplify your diet to bland foods & liquids for a few days.  Stop any stool softeners and decrease your fiber supplement.  Switching to mild anti-diarrheal medications (Kayopectate, Pepto Bismol) can help.  If this worsens or does not improve, please call us. ° °5. Wound Care ° °a. Remove your bandages with your first bowel movement, usually the day after surgery.  You may have packing if you had an abscess.  Let any packing or gauze fall come out.   °b. Wear an absorbent pad or soft cotton balls in your underwear as needed to catch any drainage and help keep the area  °c. Keep the area clean and dry.  Bathe / shower every day.  Keep the area clean by showering / bathing over the incision / wound.   It is okay to soak an open wound to help wash it.  Consider using a squeeze bottle filled with warm water to gently wash the anal area.  Wet wipes or showers / gentle washing after bowel movements is often less traumatic than regular toilet paper. °d. You will often notice bleeding with bowel movements.  This should slow down by the end of the first week of surgery.  Sitting on an ice pack can help. °e. Expect some drainage.  This should slow down by the end of the first week of surgery, but you will have occasional bleeding or drainage up to a few months after surgery.  Wear an absorbent pad or soft cotton gauze in your underwear until the drainage stops. ° °6. ACTIVITIES as tolerated:   °a. You may resume regular (light) daily activities beginning the next day--such as daily self-care, walking, climbing  stairs--gradually increasing activities as tolerated.  If you can walk 30 minutes without difficulty, it is safe to try more intense activity such as jogging, treadmill, bicycling, low-impact aerobics, swimming, etc. °b. Save the most intensive and strenuous activity for last such as sit-ups, heavy lifting, contact sports, etc  Refrain from any heavy lifting or straining until you are off narcotics for pain control.   °c. DO NOT PUSH THROUGH PAIN.  Let pain be your guide: If it hurts to do something, don't do it.  Pain is your body warning you to avoid that activity for another week until the pain goes down. °d. You may drive when you are no longer taking prescription pain medication, you can comfortably sit for long periods of time, and you can safely maneuver your car and apply brakes. °e. You may have sexual intercourse when it is comfortable.  °7. FOLLOW UP in our office °a. Please call [facility] at [phone] to set up an appointment to see your surgeon in the office for a follow-up appointment approximately 2-3 weeks after your surgery. °b. Make sure that you call for this appointment the day you arrive home to ensure a convenient appointment time. ° °8. IF YOU HAVE DISABILITY OR FAMILY LEAVE FORMS, BRING THEM TO THE OFFICE FOR PROCESSING.  DO NOT GIVE THEM TO YOUR DOCTOR. ° ° ° ° ° ° ° °WHEN TO CALL US [phone]: °1. Poor pain control °2. Reactions / problems with new medications (rash/itching, nausea, etc)  °3. Fever over 101.5 F (38.5 C) °4. Inability to urinate °5. Nausea and/or vomiting °6. Worsening swelling or bruising °7. Continued bleeding from incision. °8. Increased pain, redness, or drainage from the incision ° °The clinic staff is available to answer your questions during regular business hours (8:30am-5pm).  Please don’t hesitate to call and ask to speak to one of our nurses for clinical concerns.   A surgeon from [facility] is always on call at the hospitals °  °If you have a  medical emergency, go to the nearest emergency room or call 911. °  ° °[facility] °[address], [address], [place], [place]  [address] ? °MAIN: [phone] ? TOLL FREE: [phone] ? °FAX [phone] °[email or web address] ° ° ° ° ° ° °Post Anesthesia Home Care Instructions ° °Activity: °Get plenty of rest for the remainder of the day. A responsible individual must stay with you for 24 hours following the procedure.  °For the next 24 hours, DO NOT: °-Drive a car °-Operate machinery °-Drink alcoholic beverages °-Take any medication unless instructed by your physician °-Make any legal decisions or sign important papers. ° °Meals: °Start with liquid foods such as gelatin or soup. Progress to regular foods as tolerated. Avoid greasy, spicy, heavy foods. If nausea and/or vomiting occur, drink only clear liquids until the nausea and/or vomiting subsides. Call your physician if vomiting continues. ° °Special Instructions/Symptoms: °Your throat may feel dry or sore from the anesthesia or the breathing tube placed in your throat during surgery. If this causes discomfort, gargle with warm salt water. The discomfort should disappear within 24 hours. ° °If you had a scopolamine patch placed behind your ear for the management of post- operative nausea and/or vomiting: ° °1. The medication in the patch is effective for 72 hours, after which it should be removed.  Wrap patch in a tissue and discard in the trash. Wash hands thoroughly with soap and water. °2. You may remove the patch earlier than 72 hours if you experience unpleasant side effects which may include dry mouth, dizziness or visual disturbances. °3. Avoid touching the patch. Wash your hands with soap and water after contact with the patch. °  ° ° °

## 2017-12-16 ENCOUNTER — Encounter (HOSPITAL_BASED_OUTPATIENT_CLINIC_OR_DEPARTMENT_OTHER): Payer: Self-pay | Admitting: Surgery

## 2018-05-12 ENCOUNTER — Ambulatory Visit: Payer: BC Managed Care – PPO | Admitting: Podiatry

## 2018-05-12 ENCOUNTER — Encounter: Payer: Self-pay | Admitting: Podiatry

## 2018-05-12 ENCOUNTER — Ambulatory Visit (INDEPENDENT_AMBULATORY_CARE_PROVIDER_SITE_OTHER): Payer: BC Managed Care – PPO

## 2018-05-12 DIAGNOSIS — G5761 Lesion of plantar nerve, right lower limb: Secondary | ICD-10-CM | POA: Diagnosis not present

## 2018-05-12 DIAGNOSIS — M779 Enthesopathy, unspecified: Secondary | ICD-10-CM | POA: Diagnosis not present

## 2018-05-12 DIAGNOSIS — Q828 Other specified congenital malformations of skin: Secondary | ICD-10-CM

## 2018-05-12 DIAGNOSIS — M778 Other enthesopathies, not elsewhere classified: Secondary | ICD-10-CM

## 2018-05-12 DIAGNOSIS — G5781 Other specified mononeuropathies of right lower limb: Secondary | ICD-10-CM

## 2018-05-12 NOTE — Progress Notes (Signed)
She presents today chief complaint of pain to the forefoot.  She states that this been aching and swelling for the past 2 to 3 weeks right in this area she points to the second and third interdigital space of the right foot.  She states that she is got a small callused area sub-fifth right.  Objective: Vital signs are stable alert and oriented x3.  Pulses are palpable.  Neurologic sensorium is intact deep tendon reflexes are intact muscle strength normal symmetrical bilateral.  Reactive hyperkeratosis and eschar to the plantar aspect of the fifth metatarsal area right foot.  Scar to the dorsal fifth metatarsal distally she also has pain on palpation to the third interdigital space of the right foot with a palpable Mulder's click.  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  Debrided reactive hyperkeratosis.  Plan: After sterile Betadine skin prep Vedre injected 20 mg of Kenalog and 5 mg Marcaine after sterile Betadine skin prep to the point of maximal tenderness.  On follow-up with her in 1 month.

## 2018-08-05 ENCOUNTER — Other Ambulatory Visit: Payer: Self-pay | Admitting: Internal Medicine

## 2018-08-05 DIAGNOSIS — Z1231 Encounter for screening mammogram for malignant neoplasm of breast: Secondary | ICD-10-CM

## 2018-08-27 ENCOUNTER — Ambulatory Visit
Admission: RE | Admit: 2018-08-27 | Discharge: 2018-08-27 | Disposition: A | Discharge status: Home / Self Care | Payer: BC Managed Care – PPO | Source: Ambulatory Visit | Attending: Internal Medicine | Admitting: Internal Medicine

## 2018-08-27 DIAGNOSIS — Z1231 Encounter for screening mammogram for malignant neoplasm of breast: Secondary | ICD-10-CM | POA: Diagnosis not present

## 2018-09-05 ENCOUNTER — Other Ambulatory Visit: Payer: Self-pay | Admitting: Internal Medicine

## 2018-09-05 DIAGNOSIS — M5442 Lumbago with sciatica, left side: Principal | ICD-10-CM

## 2018-09-05 DIAGNOSIS — G8929 Other chronic pain: Secondary | ICD-10-CM

## 2018-09-08 ENCOUNTER — Telehealth: Payer: Self-pay | Admitting: *Deleted

## 2018-09-08 DIAGNOSIS — Z122 Encounter for screening for malignant neoplasm of respiratory organs: Secondary | ICD-10-CM

## 2018-09-08 DIAGNOSIS — Z87891 Personal history of nicotine dependence: Secondary | ICD-10-CM

## 2018-09-08 NOTE — Telephone Encounter (Signed)
Received referral for initial lung cancer screening scan. Contacted patient and obtained smoking history,(current, 36 pack year) as well as answering questions related to screening process. Patient denies signs of lung cancer such as weight loss or hemoptysis. Patient denies comorbidity that would prevent curative treatment if lung cancer were found. Patient is scheduled for shared decision making visit and CT scan on 09/18/18.

## 2018-09-17 ENCOUNTER — Ambulatory Visit: Payer: BC Managed Care – PPO

## 2018-09-17 ENCOUNTER — Encounter: Payer: Self-pay | Admitting: Nurse Practitioner

## 2018-09-18 ENCOUNTER — Ambulatory Visit
Admission: RE | Admit: 2018-09-18 | Discharge: 2018-09-18 | Disposition: A | Discharge status: Home / Self Care | Payer: BC Managed Care – PPO | Source: Ambulatory Visit | Attending: Nurse Practitioner | Admitting: Nurse Practitioner

## 2018-09-18 ENCOUNTER — Inpatient Hospital Stay: Payer: BC Managed Care – PPO | Attending: Nurse Practitioner | Admitting: Nurse Practitioner

## 2018-09-18 DIAGNOSIS — J439 Emphysema, unspecified: Secondary | ICD-10-CM | POA: Insufficient documentation

## 2018-09-18 DIAGNOSIS — I700 Atherosclerosis of aorta: Secondary | ICD-10-CM | POA: Insufficient documentation

## 2018-09-18 DIAGNOSIS — Z87891 Personal history of nicotine dependence: Secondary | ICD-10-CM | POA: Diagnosis not present

## 2018-09-18 DIAGNOSIS — Z122 Encounter for screening for malignant neoplasm of respiratory organs: Secondary | ICD-10-CM

## 2018-09-18 NOTE — Progress Notes (Signed)
In accordance with CMS guidelines, patient has met eligibility criteria including age, absence of signs or symptoms of lung cancer.  Social History   Tobacco Use  . Smoking status: Current Every Day Smoker    Packs/day: 1.00    Years: 41.00    Pack years: 41.00    Types: Cigarettes  . Smokeless tobacco: Never Used  Substance Use Topics  . Alcohol use: Yes    Alcohol/week: 0.0 standard drinks    Comment: 3 oz red wine daily  . Drug use: No      A shared decision-making session was conducted prior to the performance of CT scan. This includes one or more decision aids, includes benefits and harms of screening, follow-up diagnostic testing, over-diagnosis, false positive rate, and total radiation exposure.   Counseling on the importance of adherence to annual lung cancer LDCT screening, impact of co-morbidities, and ability or willingness to undergo diagnosis and treatment is imperative for compliance of the program.   Counseling on the importance of continued smoking cessation for former smokers; the importance of smoking cessation for current smokers, and information about tobacco cessation interventions have been given to patient including [place] and [facility] quit [place] programs.   Written order for lung cancer screening with LDCT has been given to the patient and any and all questions have been answered to the best of my abilities.    Yearly follow up will be coordinated by Burgess Estelle, Thoracic Navigator.  Beckey Rutter, DNP, AGNP-C [place] [facility] [phone] (work cell) [phone] (office) 09/18/18 3:06 PM

## 2018-09-23 ENCOUNTER — Encounter: Payer: Self-pay | Admitting: *Deleted

## 2018-09-27 ENCOUNTER — Ambulatory Visit
Admission: RE | Admit: 2018-09-27 | Discharge: 2018-09-27 | Disposition: A | Discharge status: Home / Self Care | Payer: BC Managed Care – PPO | Source: Ambulatory Visit | Attending: Internal Medicine | Admitting: Internal Medicine

## 2018-09-27 DIAGNOSIS — G8929 Other chronic pain: Secondary | ICD-10-CM | POA: Diagnosis not present

## 2018-09-27 DIAGNOSIS — M5442 Lumbago with sciatica, left side: Secondary | ICD-10-CM | POA: Diagnosis present

## 2018-09-27 DIAGNOSIS — M48061 Spinal stenosis, lumbar region without neurogenic claudication: Secondary | ICD-10-CM | POA: Insufficient documentation

## 2018-09-27 DIAGNOSIS — M47816 Spondylosis without myelopathy or radiculopathy, lumbar region: Secondary | ICD-10-CM | POA: Insufficient documentation

## 2018-10-02 ENCOUNTER — Encounter: Payer: Self-pay | Admitting: Student in an Organized Health Care Education/Training Program

## 2018-10-02 ENCOUNTER — Ambulatory Visit
Payer: BC Managed Care – PPO | Attending: Student in an Organized Health Care Education/Training Program | Admitting: Student in an Organized Health Care Education/Training Program

## 2018-10-02 ENCOUNTER — Other Ambulatory Visit: Payer: Self-pay

## 2018-10-02 VITALS — BP 126/63 | HR 82 | Temp 98.6°F | Resp 16 | Ht 67.0 in | Wt 165.0 lb

## 2018-10-02 DIAGNOSIS — K219 Gastro-esophageal reflux disease without esophagitis: Secondary | ICD-10-CM | POA: Insufficient documentation

## 2018-10-02 DIAGNOSIS — M51369 Other intervertebral disc degeneration, lumbar region without mention of lumbar back pain or lower extremity pain: Secondary | ICD-10-CM | POA: Insufficient documentation

## 2018-10-02 DIAGNOSIS — M545 Low back pain: Secondary | ICD-10-CM | POA: Diagnosis present

## 2018-10-02 DIAGNOSIS — M47816 Spondylosis without myelopathy or radiculopathy, lumbar region: Secondary | ICD-10-CM | POA: Diagnosis not present

## 2018-10-02 DIAGNOSIS — Z90710 Acquired absence of both cervix and uterus: Secondary | ICD-10-CM | POA: Insufficient documentation

## 2018-10-02 DIAGNOSIS — Z9889 Other specified postprocedural states: Secondary | ICD-10-CM | POA: Insufficient documentation

## 2018-10-02 DIAGNOSIS — M5136 Other intervertebral disc degeneration, lumbar region: Secondary | ICD-10-CM | POA: Diagnosis not present

## 2018-10-02 DIAGNOSIS — G894 Chronic pain syndrome: Secondary | ICD-10-CM

## 2018-10-02 NOTE — Progress Notes (Signed)
Safety precautions to be maintained throughout the outpatient stay will include: orient to surroundings, keep bed in low position, maintain call bell within reach at all times, provide assistance with transfer out of bed and ambulation.  

## 2018-10-02 NOTE — Progress Notes (Signed)
Patient's Name: Vanessa Greer  MRN: [id number]  Referring Provider: Adin Hector, MD  DOB: Aug 25, 1961  PCP: Adin Hector, MD  DOS: 10/02/2018  Note by: Gillis Santa, MD  Service setting: Ambulatory outpatient  Specialty: Interventional Pain Management  Location: [facility] (AMB) Pain Management Facility  Visit type: Initial Patient Evaluation  Patient type: New Patient   Primary Reason(s) for Visit: Encounter for initial evaluation of one or more chronic problems (new to examiner) potentially causing chronic pain, and posing a threat to normal musculoskeletal function. (Level of risk: High) CC: Back Pain (lower)  HPI  Vanessa Greer is a 57 y.o. year old, female patient, who comes today to see [place] for the first time for an initial evaluation of her chronic pain. 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 Today she comes in for evaluation of her Back Pain (lower)  Pain Assessment: Location: Lower Back Radiating: through buttocks and hips bilaterally Onset: More than a month ago Duration: Chronic pain Quality: Aching, Constant, Burning Severity: 7 /10 (subjective, self-reported pain score)  Note: Reported level is inconsistent with clinical observations. Clinically the patient looks like a 3/10 A 3/10 is viewed as "Moderate" and described as significantly interfering with activities of daily living (ADL). It becomes difficult to feed, bathe, get dressed, get on and off the toilet or to perform personal hygiene functions. Difficult to get in and out of bed or a chair without assistance. Very distracting. With effort, it can be ignored when deeply involved in activities. Information on the proper use of the pain  scale provided to the patient today. When using our objective Pain Scale, levels between 6 and 10/10 are said to belong in an emergency room, as it progressively worsens from a 6/10, described as severely limiting, requiring emergency care not usually available at an outpatient pain management facility. At a 6/10 level, communication becomes difficult and requires great effort. Assistance to reach the emergency department may be required. Facial flushing and profuse sweating along with potentially dangerous increases in heart rate and blood pressure will be evident. Effect on ADL: low energy, "difficult to get throughtthe day", "I'm irritable a lot" Timing: Constant Modifying factors: meds help for up to 3 hours BP: 126/63  HR: 82  Onset and Duration: Gradual and Present longer than 3 months Cause of pain: Unknown Severity: Getting worse, NAS-11 at its worse: 10/10, NAS-11 at its best: 3/10 and NAS-11 on the average: 6/10 Timing: Not influenced by the time of the day, During activity or exercise and After activity or exercise Aggravating Factors: Bending, Climbing, Kneeling, Lifiting, Prolonged sitting, Prolonged standing, Squatting and Walking uphill Alleviating Factors: Hot packs and Warm showers or baths Associated Problems: Fatigue, Inability to concentrate, Sadness, Pain that wakes patient up and Pain that does not allow patient to sleep Quality of Pain: Aching, Agonizing, Burning, Constant, Deep, Distressing, Exhausting, Horrible, Nagging, Shooting, Toothache-like and Uncomfortable Previous Examinations or Tests: MRI scan Previous Treatments: Narcotic medications, Steroid treatments by mouth and Trigger point injections  The patient comes into the clinics today for the first time for a chronic pain management evaluation.   Very pleasant 57 year old female who presents with a chief complaint of bilateral axial low back pain, buttock pain, posterior thigh pain.  Patient states that  this pain  is been present for greater than 3 years.  She is currently on hydrocodone 7.5 mg 3 times daily as needed along with Klonopin 1 mg at night for sleep.  Patient is also on Flexeril 10 mg twice daily as needed, Lyrica 150 mg twice daily.  This is managed by her primary care physician.  Patient has had epidural steroid injections in the past which she states were not effective.  She recently had a lumbar MRI completed results of which are below.  Patient denies any bowel or bladder dysfunction.  She states that the pain is worse with lumbar extension and lateral rotation.  Patient states that her hydrocodone has become less effective over time.  She has requested her primary care physician to increase her frequency to every 6 hours from every 8 hours.  Today I took the time to provide the patient with information regarding my pain practice. The patient was informed that my practice is divided into two sections: an interventional pain management section, as well as a completely separate and distinct medication management section. I explained that I have procedure days for my interventional therapies, and evaluation days for follow-ups and medication management. Because of the amount of documentation required during both, they are kept separated. This means that there is the possibility that she may be scheduled for a procedure on one day, and medication management the next. I have also informed her that because of staffing and facility limitations, I no longer take patients for medication management only. To illustrate the reasons for this, I gave the patient the example of surgeons, and how inappropriate it would be to refer a patient to his/her care, just to write for the post-surgical antibiotics on a surgery done by a different surgeon.   Because interventional pain management is my board-certified specialty, the patient was informed that joining my practice means that they are open to any and all  interventional therapies. I made it clear that this does not mean that they will be forced to have any procedures done. What this means is that I believe interventional therapies to be essential part of the diagnosis and proper management of chronic pain conditions. Therefore, patients not interested in these interventional alternatives will be better served under the care of a different practitioner.  The patient was also made aware of my Comprehensive Pain Management Safety Guidelines where by joining my practice, they limit all of their nerve blocks and joint injections to those done by our practice, for as long as we are retained to manage their care.   Historic Controlled Substance Pharmacotherapy Review  PMP and historical list of controlled substances: Hydrocodone 7.5 mg 3 times daily as needed, quantity 90/month MME/day: 22.5 mg/day Medications: The patient did not bring the medication(s) to the appointment, as requested in our "New Patient Package" Pharmacodynamics: Desired effects: Analgesia: The patient reports >50% benefit. Reported improvement in function: The patient reports medication allows her to accomplish basic ADLs. Clinically meaningful improvement in function (CMIF): Sustained CMIF goals met Perceived effectiveness: Described as relatively effective, allowing for increase in activities of daily living (ADL) Undesirable effects: Side-effects or Adverse reactions: None reported Historical Monitoring: The patient  reports that she does not use drugs. List of all UDS Test(s): No results found for: MDMA, COCAINSCRNUR, [place], [place], CANNABQUANT, THCU, [place] List of other Serum/Urine Drug Screening Test(s):  No results found for: AMPHSCRSER, BARBSCRSER, BENZOSCRSER, COCAINSCRSER, COCAINSCRNUR, PCPSCRSER, PCPQUANT, THCSCRSER, THCU, CANNABQUANT, OPIATESCRSER, OXYSCRSER, PROPOXSCRSER, ETH Historical Background Evaluation:  [place] PMP: Six (6) year initial data search conducted.              [place] Department of public safety, offender search: Editor, commissioning Information) Non-contributory Risk Assessment Profile: Aberrant behavior: None observed or detected today Risk factors for fatal opioid overdose: concomitant use of Benzodiazepines Fatal overdose hazard ratio (HR): Calculation deferred Non-fatal overdose hazard ratio (HR): Calculation deferred Risk of opioid abuse or dependence: 0.7-3.0% with doses ? 36 MME/day and 6.1-26% with doses ? 120 MME/day. Substance use disorder (SUD) risk level: See below Personal History of Substance Abuse (SUD-Substance use disorder):  Alcohol: Negative  Illegal Drugs: Negative  Rx Drugs: Negative  ORT Risk Level calculation: Low Risk Opioid Risk Tool - 10/02/18 1428      Family History of Substance Abuse   Alcohol  Positive Female    Illegal Drugs  Negative    Rx Drugs  Negative      Personal History of Substance Abuse   Alcohol  Negative    Illegal Drugs  Negative    Rx Drugs  Negative      Age   Age between 17-45 years   No      History of Preadolescent Sexual Abuse   History of Preadolescent Sexual Abuse  Negative or Female      Psychological Disease   Psychological Disease  Negative    Depression  Positive   "I feel like if I could get the pain under control I would feel better"     Total Score   Opioid Risk Tool Scoring  2    Opioid Risk Interpretation  Low Risk      ORT Scoring interpretation table:  Score <3 = Low Risk for SUD  Score between 4-7 = Moderate Risk for SUD  Score >8 = High Risk for Opioid Abuse   PHQ-2 Depression Scale:  Total score: 0  PHQ-2 Scoring interpretation table: (Score and probability of major depressive disorder)  Score 0 = No depression  Score 1 = 15.4% Probability  Score 2 = 21.1% Probability  Score 3 = 38.4% Probability  Score 4 = 45.5% Probability  Score 5 = 56.4% Probability  Score 6 = 78.6% Probability   PHQ-9 Depression Scale:  Total score: 0  PHQ-9 Scoring interpretation table:   Score 0-4 = No depression  Score 5-9 = Mild depression  Score 10-14 = Moderate depression  Score 15-19 = Moderately severe depression  Score 20-27 = Severe depression (2.4 times higher risk of SUD and 2.89 times higher risk of overuse)   Pharmacologic Plan: Non-opioid analgesic therapy offered.            Initial impression: No chronic opioid therapy for patients that are on chronic benzodiazepines.  Patient should try and wean off her Klonopin with the help of her PCP if she would like to be considered for chronic opioid therapy at this clinic.  Meds   Current Outpatient Medications:  .  clonazePAM (KLONOPIN) 1 MG tablet, 1 mg at bedtime. , Disp: , Rfl: 0 .  cyclobenzaprine (FLEXERIL) 10 MG tablet, Take 10 mg by mouth 2 (two) times daily as needed for muscle spasms., Disp: , Rfl:  .  HYDROcodone-acetaminophen (NORCO) 7.5-325 MG tablet, Take 1-2 tablets by mouth every 6 (six) hours as needed for moderate pain or severe pain., Disp: 40 tablet, Rfl: 0 .  LYRICA 150 MG capsule, 150 mg 2 (two) times daily. , Disp: , Rfl: 0 .  Multiple Vitamin (MULTIVITAMIN WITH MINERALS) TABS  tablet, Take 1 tablet by mouth daily., Disp: , Rfl:  .  Omega-3 Fatty Acids (OMEGA 3 500 PO), Take by mouth daily., Disp: , Rfl:  .  pantoprazole (PROTONIX) 40 MG tablet, Take 40 mg by mouth daily., Disp: , Rfl: 0 .  sertraline (ZOLOFT) 50 MG tablet, 100 mg daily. , Disp: , Rfl: 0 .  simvastatin (ZOCOR) 40 MG tablet, 40 mg daily at 6 PM. , Disp: , Rfl: 0 .  Turmeric 500 MG CAPS, Take by mouth daily., Disp: , Rfl:  .  Cholecalciferol (VITAMIN D3) 3000 units TABS, Take 1,000 Units by mouth daily., Disp: , Rfl:   Imaging Review    Lumbosacral Imaging: Lumbar MR wo contrast:  Results for orders placed during the hospital encounter of 09/27/18  MR LUMBAR SPINE WO CONTRAST   Narrative CLINICAL DATA:  Low back pain. BILATERAL leg pain. Symptoms for years.  EXAM: MRI LUMBAR SPINE WITHOUT  CONTRAST  TECHNIQUE: Multiplanar, multisequence MR imaging of the lumbar spine was performed. No intravenous contrast was administered.  COMPARISON:  02/08/2017.  09/03/2013.  FINDINGS: Segmentation:  Standard.  Alignment:  Anatomic.  Vertebrae: No worrisome osseous lesion. Modic type 2 changes, L4-5 endplates.  Conus medullaris and cauda equina: Conus extends to the L1. level. Conus and cauda equina appear normal.  Paraspinal and other soft tissues: Unremarkable.  Disc levels:  L1-L2:  Normal.  L2-L3:  Normal.  L3-L4: Good disc height and hydration. No protrusion. Mild facet arthropathy.  L4-L5: Loss of disc height and signal. Chronic endplate changes. Annular fissure with shallow protrusion. Facet arthropathy and ligamentum flavum hypertrophy. Borderline stenosis. No impingement.  L5-S1: Disc desiccation with preserved disc height. Annular bulge. Facet arthropathy. No impingement.  IMPRESSION: Lumbar spondylosis as described, stable from 2018. There is no significant spinal stenosis, or dominant area of subarticular zone/foraminal zone narrowing.  Disc space narrowing, with Modic type 2 endplate changes at F7-9, would be the most likely source of axial low back pain.   Electronically Signed   By: Staci Righter M.D.   On: 09/28/2018 15:48    Ankle Imaging: Ankle-R DG Complete:  Results for orders placed in visit on 06/25/16  DG Ankle Complete Right   Narrative 3 views of the right ankle demonstrates osseous mature foot normal right  ankle architecture. Normal mortise. No fractures are identified.   Ankle-L DG Complete: No results found for this or any previous visit.  Foot Imaging: Foot-R DG Complete:  Results for orders placed in visit on 05/12/18  DG Foot Complete Right   Narrative Please see detailed radiograph report in office note.   Foot-L DG Complete:  Results for orders placed in visit on 08/24/15  DG Foot Complete Left   Narrative These  views are not visible for evaluation.   Complexity Note: Imaging results reviewed. Results shared with Ms. Moody, using [employer].                         ROS  Cardiovascular: No reported cardiovascular signs or symptoms such as High blood pressure, coronary artery disease, abnormal heart rate or rhythm, heart attack, blood thinner therapy or heart weakness and/or failure Pulmonary or Respiratory: Smoking and Snoring  Neurological: No reported neurological signs or symptoms such as seizures, abnormal skin sensations, urinary and/or fecal incontinence, being born with an abnormal open spine and/or a tethered spinal cord Review of Past Neurological Studies: No results found for this or any previous visit. Psychological-Psychiatric: Anxiousness  and Depressed Gastrointestinal: Reflux or heatburn Genitourinary: No reported renal or genitourinary signs or symptoms such as difficulty voiding or producing urine, peeing blood, non-functioning kidney, kidney stones, difficulty emptying the bladder, difficulty controlling the flow of urine, or chronic kidney disease Hematological: No reported hematological signs or symptoms such as prolonged bleeding, low or poor functioning platelets, bruising or bleeding easily, hereditary bleeding problems, low energy levels due to low hemoglobin or being anemic Endocrine: No reported endocrine signs or symptoms such as high or low blood sugar, rapid heart rate due to high thyroid levels, obesity or weight gain due to slow thyroid or thyroid disease Rheumatologic: No reported rheumatological signs and symptoms such as fatigue, joint pain, tenderness, swelling, redness, heat, stiffness, decreased range of motion, with or without associated rash Musculoskeletal: Negative for myasthenia gravis, muscular dystrophy, multiple sclerosis or malignant hyperthermia Work History: Working full time  Allergies  Ms. Valenza is allergic to bupropion.  Laboratory Chemistry   Inflammation Markers (CRP: Acute Phase) (ESR: Chronic Phase) No results found for: CRP, ESRSEDRATE, LATICACIDVEN                       Rheumatology Markers No results found for: RF, ANA, LABURIC, URICUR, LYMEIGGIGMAB, LYMEABIGMQN, HLAB27                      Renal Function Markers No results found for: BUN, CREATININE, BCR, GFRAA, GFRNONAA                           Hepatic Function Markers No results found for: AST, ALT, ALBUMIN, ALKPHOS, HCVAB, AMYLASE, LIPASE, AMMONIA                      Electrolytes No results found for: NA, K, CL, CALCIUM, MG, PHOS                      Neuropathy Markers No results found for: VITAMINB12, FOLATE, HGBA1C, HIV                      CNS Tests No results found for: COLORCSF, APPEARCSF, RBCCOUNTCSF, WBCCSF, POLYSCSF, LYMPHSCSF, EOSCSF, PROTEINCSF, GLUCCSF, JCVIRUS, CSFOLI, IGGCSF                      Bone Pathology Markers No results found for: VD25OH, [id number], [id number], GL8756EP3, 25OHVITD1, 25OHVITD2, 25OHVITD3, TESTOFREE, TESTOSTERONE                       Coagulation Parameters No results found for: INR, LABPROT, APTT, PLT, DDIMER                      Cardiovascular Markers Lab Results  Component Value Date   HGB 14.3 12/13/2017                         CA Markers No results found for: CEA, CA125, LABCA2                      Note: Lab results reviewed.  [place]  Drug: Ms. Fishburn  reports that she does not use drugs. Alcohol:  reports that she drinks alcohol. Tobacco:  reports that she has been smoking cigarettes. She has a 41.00 pack-year smoking history. She has never used smokeless tobacco. Medical:  has a  past medical history of Anxiety, Back pain, DDD (degenerative disc disease), lumbar, Depression, GERD (gastroesophageal reflux disease), High cholesterol, Lumbar radiculitis, and Pneumothorax (1982). Family: family history includes Breast cancer (age of onset: 39) in her mother.  Past Surgical History:  Procedure Laterality  Date  . ABDOMINAL HYSTERECTOMY    . COLONOSCOPY    . ECTOPIC PREGNANCY SURGERY    . FOOT SURGERY    . LASER ABLATION CONDOLAMATA N/A 12/13/2017   Procedure: LASER REMOVAL ABLATION OF CONDYLOMATA, EXAMINATION UNDER ANESTHESIA, HEMORRIDAL LIGATION AND PEXY;  Surgeon: Michael Boston, MD;  Location: [facility];  Service: General;  Laterality: N/A;  GENERAL AND LOCAL   Active Ambulatory Problems    Diagnosis Date Noted  . Chronic bilateral low back pain with bilateral sciatica 01/29/2017  . GERD without esophagitis 01/28/2017  . 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 Prolapsed internal hemorrhoids, grade 3, s/p ligation/pexy 12/13/2017 12/13/2017  . Lumbar facet arthropathy 10/02/2018  . Lumbar spondylosis 10/02/2018  . Lumbar degenerative disc disease 10/02/2018  . Chronic pain syndrome 10/02/2018   Resolved Ambulatory Problems    Diagnosis Date Noted  . No Resolved Ambulatory Problems   Past Medical History:  Diagnosis Date  . Anxiety   . Back pain   . DDD (degenerative disc disease), lumbar   . Depression   . GERD (gastroesophageal reflux disease)   . High cholesterol   . Lumbar radiculitis   . Pneumothorax 1982   Constitutional Exam  General appearance: Well nourished, well developed, and well hydrated. In no apparent acute distress Vitals:   10/02/18 1418  BP: 126/63  Pulse: 82  Resp: 16  Temp: 98.6 F (37 C)  TempSrc: Oral  SpO2: 97%  Weight: 165 lb (74.8 kg)  Height: '5\' 7"'  (1.702 m)   BMI Assessment: Estimated body mass index is 25.84 kg/m as calculated from the following:   Height as of this encounter: '5\' 7"'  (1.702 m).   Weight as of this encounter: 165 lb (74.8 kg).  BMI interpretation table: BMI level Category Range association with higher incidence of chronic pain  <18 kg/m2 Underweight   18.5-24.9 kg/m2 Ideal body weight   25-29.9 kg/m2 Overweight Increased incidence by 20%  30-34.9 kg/m2 Obese (Class I) Increased  incidence by 68%  35-39.9 kg/m2 Severe obesity (Class II) Increased incidence by 136%  >40 kg/m2 Extreme obesity (Class III) Increased incidence by 254%   Patient's current BMI Ideal Body weight  Body mass index is 25.84 kg/m. Ideal body weight: 61.6 kg (135 lb 12.9 oz) Adjusted ideal body weight: 66.9 kg (147 lb 7.7 oz)   BMI Readings from Last 4 Encounters:  10/02/18 25.84 kg/m  09/18/18 25.50 kg/m  12/13/17 25.58 kg/m  11/07/15 25.06 kg/m   Wt Readings from Last 4 Encounters:  10/02/18 165 lb (74.8 kg)  09/18/18 158 lb (71.7 kg)  12/13/17 158 lb 8 oz (71.9 kg)  11/07/15 160 lb (72.6 kg)  Psych/Mental status: Alert, oriented x 3 (person, place, & time)       Eyes: PERLA Respiratory: No evidence of acute respiratory distress  Cervical Spine Area Exam  Skin & Axial Inspection: No masses, redness, edema, swelling, or associated skin lesions Alignment: Symmetrical Functional ROM: Unrestricted ROM      Stability: No instability detected Muscle Tone/Strength: Functionally intact. No obvious neuro-muscular anomalies detected. Sensory (Neurological): Unimpaired Palpation: No palpable anomalies              Upper Extremity (UE) Exam  Side: Right upper extremity  Side: Left upper extremity  Skin & Extremity Inspection: Skin color, temperature, and hair growth are WNL. No peripheral edema or cyanosis. No masses, redness, swelling, asymmetry, or associated skin lesions. No contractures.  Skin & Extremity Inspection: Skin color, temperature, and hair growth are WNL. No peripheral edema or cyanosis. No masses, redness, swelling, asymmetry, or associated skin lesions. No contractures.  Functional ROM: Unrestricted ROM          Functional ROM: Unrestricted ROM          Muscle Tone/Strength: Functionally intact. No obvious neuro-muscular anomalies detected.  Muscle Tone/Strength: Functionally intact. No obvious neuro-muscular anomalies detected.  Sensory (Neurological): Unimpaired           Sensory (Neurological): Unimpaired          Palpation: No palpable anomalies              Palpation: No palpable anomalies              Provocative Test(s):  Phalen's test: deferred Tinel's test: deferred Apley's scratch test (touch opposite shoulder):  Action 1 (Across chest): deferred Action 2 (Overhead): deferred Action 3 (LB reach): deferred   Provocative Test(s):  Phalen's test: deferred Tinel's test: deferred Apley's scratch test (touch opposite shoulder):  Action 1 (Across chest): deferred Action 2 (Overhead): deferred Action 3 (LB reach): deferred    Thoracic Spine Area Exam  Skin & Axial Inspection: No masses, redness, or swelling Alignment: Symmetrical Functional ROM: Unrestricted ROM Stability: No instability detected Muscle Tone/Strength: Functionally intact. No obvious neuro-muscular anomalies detected. Sensory (Neurological): Unimpaired Muscle strength & Tone: No palpable anomalies  Lumbar Spine Area Exam  Skin & Axial Inspection: No masses, redness, or swelling Alignment: Symmetrical Functional ROM: Decreased ROM       Stability: No instability detected Muscle Tone/Strength: Functionally intact. No obvious neuro-muscular anomalies detected. Sensory (Neurological): Articular pain pattern Palpation: No palpable anomalies       Provocative Tests: Hyperextension/rotation test: (+) bilaterally for facet joint pain. Lumbar quadrant test (Kemp's test): (+) bilaterally for facet joint pain. Lateral bending test: deferred today       Patrick's Maneuver: deferred today                   FABER test: deferred today                   S-I anterior distraction/compression test: deferred today         S-I lateral compression test: deferred today         S-I Thigh-thrust test: deferred today         S-I Gaenslen's test: deferred today          Gait & Posture Assessment  Ambulation: Unassisted Gait: Relatively normal for age and body habitus Posture: WNL   Lower  Extremity Exam    Side: Right lower extremity  Side: Left lower extremity  Stability: No instability observed          Stability: No instability observed          Skin & Extremity Inspection: Skin color, temperature, and hair growth are WNL. No peripheral edema or cyanosis. No masses, redness, swelling, asymmetry, or associated skin lesions. No contractures.  Skin & Extremity Inspection: Skin color, temperature, and hair growth are WNL. No peripheral edema or cyanosis. No masses, redness, swelling, asymmetry, or associated skin lesions. No contractures.  Functional ROM: Unrestricted ROM  Functional ROM: Unrestricted ROM                  Muscle Tone/Strength: Functionally intact. No obvious neuro-muscular anomalies detected.  Muscle Tone/Strength: Functionally intact. No obvious neuro-muscular anomalies detected.  Sensory (Neurological): Unimpaired  Sensory (Neurological): Unimpaired  Palpation: No palpable anomalies  Palpation: No palpable anomalies   Assessment  Primary Diagnosis & Pertinent Problem List: The primary encounter diagnosis was Lumbar facet arthropathy. Diagnoses of Lumbar spondylosis, Lumbar degenerative disc disease, and Chronic pain syndrome were also pertinent to this visit.  Visit Diagnosis (New problems to examiner): 1. Lumbar facet arthropathy   2. Lumbar spondylosis   3. Lumbar degenerative disc disease   4. Chronic pain syndrome    General Recommendations: The pain condition that the patient suffers from is best treated with a multidisciplinary approach that involves an increase in physical activity to prevent de-conditioning and worsening of the pain cycle, as well as psychological counseling (formal and/or informal) to address the co-morbid psychological affects of pain. Treatment will often involve judicious use of pain medications and interventional procedures to decrease the pain, allowing the patient to participate in the physical activity that will  ultimately produce long-lasting pain reductions. The goal of the multidisciplinary approach is to return the patient to a higher level of overall function and to restore their ability to perform activities of daily living.  LANIA ZAWISTOWSKI has a history of greater than 3 months of moderate to severe pain which is resulted in functional impairment.  The patient has tried various conservative therapeutic options such as NSAIDs, Tylenol, muscle relaxants, physical therapy which was inadequately effective.  Patient's pain is predominantly axial with physical exam findings suggestive of facet arthropathy.  Patient is lumbar MRI findings are also consistent with lumbar spondylosis and facet arthropathy most pronounced at L3/4, L4/5 and L5/S1. Lumbar facet medial branch nerve blocks were discussed with the patient.  Risks and benefits were reviewed.  Patient would like to proceed with bilateral L3, L4, L5 medial branch nerve block.  In regards to medication management, I recommended the patient continue that with her primary care physician.  Patient will not be a candidate for chronic opioid therapy here until she is able to discontinue her Klonopin.  I informed her of our clinic policy of not co-prescribing opioids to patients that are on chronic benzodiazepines given the increased risk of adverse cardiorespiratory events.  Patient endorsed understanding.  I encouraged the patient to decrease her Klonopin intake to 1 tablet every other day for 1 to 2 weeks and then half tablet every other day for 1 to 2 weeks.  I also encouraged the patient to follow-up with Dr. Caryl Comes so that he could weigh in on her Klonopin wean.  The patient can take Flexeril and/or melatonin at night to help her with sleep.  The Flexeril may also help with her pain.  Plan: -UDS today.  Informed patient that to be a candidate for chronic opioid therapy at this clinic, she needs to discontinue, wean off her Klonopin which can be done with the  help of her PCP Dr. Caryl Comes.  Basic wean instructions provided above -Lumbar facet medial branch nerve blocks at L3, L4, L5 with possible lumbar radio frequency ablation thereafter -Continue all other chronic pain medications as prescribed by PCP.   Plan of Care (Initial workup plan)  Note: Please be advised that as per protocol, today's visit has been an evaluation only. We have not taken over the patient's controlled  substance management.  Ordered Lab-work, Procedure(s), Referral(s), & Consult(s): Orders Placed This Encounter  Procedures  . LUMBAR FACET(MEDIAL BRANCH NERVE BLOCK) MBNB  . Compliance Drug Analysis, Ur   Pharmacotherapy (current): Medications ordered:  No orders of the defined types were placed in this encounter.  Medications administered during this visit: Brandalyn Harting. Pope had no medications administered during this visit.   Pharmacological management options:  Opioid Analgesics: The patient was informed that there is no guarantee that she would be a candidate for opioid analgesics. The decision will be made following CDC guidelines. This decision will be based on the results of diagnostic studies, as well as Ms. Arens's risk profile.   Membrane stabilizer: To be determined at a later time  Muscle relaxant: To be determined at a later time  NSAID: To be determined at a later time  Other analgesic(s): To be determined at a later time   Interventional management options: Ms. Denunzio was informed that there is no guarantee that she would be a candidate for interventional therapies. The decision will be based on the results of diagnostic studies, as well as Ms. Fendley's risk profile.  Procedure(s) under consideration:  L3, L4, L5 lumbar facet medial branch nerve blocks with possible radio frequency ablation Bilateral SI joint injections   Provider-requested follow-up: Return in about 4 weeks (around 10/30/2018) for Procedure.  Future Appointments  Date Time Provider  [place]  10/29/2018  9:00 AM Gillis Santa, MD [facility] None    Primary Care Physician: Adin Hector, MD Location: [facility] Note by: Gillis Santa, M.D, Date: 10/02/2018; Time: 3:38 PM  Patient Instructions   Moderate Conscious Sedation, Adult Sedation is the use of medicines to promote relaxation and relieve discomfort and anxiety. Moderate conscious sedation is a type of sedation. Under moderate conscious sedation, you are less alert than normal, but you are still able to respond to instructions, touch, or both. Moderate conscious sedation is used during short medical and dental procedures. It is milder than deep sedation, which is a type of sedation under which you cannot be easily woken up. It is also milder than general anesthesia, which is the use of medicines to make you unconscious. Moderate conscious sedation allows you to return to your regular activities sooner. Tell a health care provider about:  Any allergies you have.  All medicines you are taking, including vitamins, herbs, eye drops, creams, and over-the-counter medicines.  Use of steroids (by mouth or creams).  Any problems you or family members have had with sedatives and anesthetic medicines.  Any blood disorders you have.  Any surgeries you have had.  Any medical conditions you have, such as sleep apnea.  Whether you are pregnant or may be pregnant.  Any use of cigarettes, alcohol, marijuana, or street drugs. What are the risks? Generally, this is a safe procedure. However, problems may occur, including:  Getting too much medicine (oversedation).  Nausea.  Allergic reaction to medicines.  Trouble breathing. If this happens, a breathing tube may be used to help with breathing. It will be removed when you are awake and breathing on your own.  Heart trouble.  Lung trouble.  What happens before the procedure? Staying hydrated Follow instructions from your  health care provider about hydration, which may include:  Up to 2 hours before the procedure - you may continue to drink clear liquids, such as water, clear fruit juice, black coffee, and plain tea.  Eating and drinking restrictions Follow instructions from  your health care provider about eating and drinking, which may include:  8 hours before the procedure - stop eating heavy meals or foods such as meat, fried foods, or fatty foods.  6 hours before the procedure - stop eating light meals or foods, such as toast or cereal.  6 hours before the procedure - stop drinking milk or drinks that contain milk.  2 hours before the procedure - stop drinking clear liquids.  Medicine  Ask your health care provider about:  Changing or stopping your regular medicines. This is especially important if you are taking diabetes medicines or blood thinners.  Taking medicines such as aspirin and ibuprofen. These medicines can thin your blood. Do not take these medicines before your procedure if your health care provider instructs you not to.  Tests and exams  You will have a physical exam.  You may have blood tests done to show: ? How well your kidneys and liver are working. ? How well your blood can clot. General instructions  Plan to have someone take you home from the hospital or clinic.  If you will be going home right after the procedure, plan to have someone with you for 24 hours. What happens during the procedure?  An IV tube will be inserted into one of your veins.  Medicine to help you relax (sedative) will be given through the IV tube.  The medical or dental procedure will be performed. What happens after the procedure?  Your blood pressure, heart rate, breathing rate, and blood oxygen level will be monitored often until the medicines you were given have worn off.  Do not drive for 24 hours. This information is not intended to replace advice given to you by your health care  provider. Make sure you discuss any questions you have with your health care provider. Document Released: 09/11/2001 Document Revised: 05/22/2016 Document Reviewed: 04/07/2016 Elsevier Interactive Patient Education  2018 [employer]  What are the risk, side effects and possible complications? Generally speaking, most procedures are safe.  However, with any procedure there are risks, side effects, and the possibility of complications.  The risks and complications are dependent upon the sites that are lesioned, or the type of nerve block to be performed.  The closer the procedure is to the spine, the more serious the risks are.  Great care is taken when placing the radio frequency needles, block needles or lesioning probes, but sometimes complications can occur. 1. Infection: Any time there is an injection through the skin, there is a risk of infection.  This is why sterile conditions are used for these blocks.  There are four possible types of infection. 1. Localized skin infection. 2. Central Nervous System Infection-This can be in the form of Meningitis, which can be deadly. 3. Epidural Infections-This can be in the form of an epidural abscess, which can cause pressure inside of the spine, causing compression of the spinal cord with subsequent paralysis. This would require an emergency surgery to decompress, and there are no guarantees that the patient would recover from the paralysis. 4. Discitis-This is an infection of the intervertebral discs.  It occurs in about 1% of discography procedures.  It is difficult to treat and it may lead to surgery.        2. Pain: the needles have to go through skin and soft tissues, will cause soreness.       3. Damage to internal structures:  The nerves to be  lesioned may be near blood vessels or    other nerves which can be potentially damaged.       4. Bleeding: Bleeding is more common if the patient is taking blood  thinners such as  aspirin, Coumadin, Ticiid, Plavix, etc., or if he/she have some genetic predisposition  such as hemophilia. Bleeding into the spinal canal can cause compression of the spinal  cord with subsequent paralysis.  This would require an emergency surgery to  decompress and there are no guarantees that the patient would recover from the  paralysis.       5. Pneumothorax:  Puncturing of a lung is a possibility, every time a needle is introduced in  the area of the chest or upper back.  Pneumothorax refers to free air around the  collapsed lung(s), inside of the thoracic cavity (chest cavity).  Another two possible  complications related to a similar event would include: Hemothorax and Chylothorax.   These are variations of the Pneumothorax, where instead of air around the collapsed  lung(s), you may have blood or chyle, respectively.       6. Spinal headaches: They may occur with any procedures in the area of the spine.       7. Persistent CSF (Cerebro-Spinal Fluid) leakage: This is a rare problem, but may occur  with prolonged intrathecal or epidural catheters either due to the formation of a fistulous  track or a dural tear.       8. Nerve damage: By working so close to the spinal cord, there is always a possibility of  nerve damage, which could be as serious as a permanent spinal cord injury with  paralysis.       9. Death:  Although rare, severe deadly allergic reactions known as "Anaphylactic  reaction" can occur to any of the medications used.      10. Worsening of the symptoms:  We can always make thing worse.  What are the chances of something like this happening? Chances of any of this occuring are extremely low.  By statistics, you have more of a chance of getting killed in a motor vehicle accident: while driving to the hospital than any of the above occurring .  Nevertheless, you should be aware that they are possibilities.  In general, it is similar to taking a shower.  Everybody knows  that you can slip, hit your head and get killed.  Does that mean that you should not shower again?  Nevertheless always keep in mind that statistics do not mean anything if you happen to be on the wrong side of them.  Even if a procedure has a 1 (one) in a 1,000,000 (million) chance of going wrong, it you happen to be that one..Also, keep in mind that by statistics, you have more of a chance of having something go wrong when taking medications.  Who should not have this procedure? If you are on a blood thinning medication (e.g. Coumadin, Plavix, see list of "Blood Thinners"), or if you have an active infection going on, you should not have the procedure.  If you are taking any blood thinners, please inform your physician.  How should I prepare for this procedure?  Do not eat or drink anything at least six hours prior to the procedure.  Bring a driver with you .  It cannot be a taxi.  Come accompanied by an adult that can drive you back, and that is strong enough to help you if your legs  get weak or numb from the local anesthetic.  Take all of your medicines the morning of the procedure with just enough water to swallow them.  If you have diabetes, make sure that you are scheduled to have your procedure done first thing in the morning, whenever possible.  If you have diabetes, take only half of your insulin dose and notify our nurse that you have done so as soon as you arrive at the clinic.  If you are diabetic, but only take blood sugar pills (oral hypoglycemic), then do not take them on the morning of your procedure.  You may take them after you have had the procedure.  Do not take aspirin or any aspirin-containing medications, at least eleven (11) days prior to the procedure.  They may prolong bleeding.  Wear loose fitting clothing that may be easy to take off and that you would not mind if it got stained with Betadine or blood.  Do not wear any jewelry or perfume  Remove any nail  coloring.  It will interfere with some of our monitoring equipment.  NOTE: Remember that this is not meant to be interpreted as a complete list of all possible complications.  Unforeseen problems may occur.  BLOOD THINNERS The following drugs contain aspirin or other products, which can cause increased bleeding during surgery and should not be taken for 2 weeks prior to and 1 week after surgery.  If you should need take something for relief of minor pain, you may take acetaminophen which is found in Tylenol,m Datril, Anacin-3 and Panadol. It is not blood thinner. The products listed below are.  Do not take any of the products listed below in addition to any listed on your instruction sheet.  A.P.C or A.P.C with Codeine Codeine Phosphate Capsules #3 Ibuprofen Ridaura  ABC compound Congesprin Imuran rimadil  Advil Cope Indocin Robaxisal  Alka-Seltzer Effervescent Pain Reliever and Antacid Coricidin or Coricidin-D  Indomethacin Rufen  Alka-Seltzer plus Cold Medicine Cosprin Ketoprofen S-A-C Tablets  Anacin Analgesic Tablets or Capsules Coumadin Korlgesic Salflex  Anacin Extra Strength Analgesic tablets or capsules CP-2 Tablets Lanoril Salicylate  Anaprox Cuprimine Capsules Levenox Salocol  Anexsia-D Dalteparin Magan Salsalate  Anodynos Darvon compound Magnesium Salicylate Sine-off  Ansaid Dasin Capsules Magsal Sodium Salicylate  Anturane Depen Capsules Marnal Soma  APF Arthritis pain formula Dewitt's Pills Measurin Stanback  Argesic Dia-Gesic Meclofenamic Sulfinpyrazone  Arthritis Bayer Timed Release Aspirin Diclofenac Meclomen Sulindac  Arthritis pain formula Anacin Dicumarol Medipren Supac  Analgesic (Safety coated) Arthralgen Diffunasal Mefanamic Suprofen  Arthritis Strength Bufferin Dihydrocodeine Mepro Compound Suprol  Arthropan liquid Dopirydamole Methcarbomol with Aspirin Synalgos  ASA tablets/Enseals Disalcid Micrainin Tagament  Ascriptin Doan's Midol Talwin  Ascriptin A/D Dolene  Mobidin Tanderil  Ascriptin Extra Strength Dolobid Moblgesic Ticlid  Ascriptin with Codeine Doloprin or Doloprin with Codeine Momentum Tolectin  Asperbuf Duoprin Mono-gesic Trendar  Aspergum Duradyne Motrin or Motrin IB Triminicin  Aspirin plain, buffered or enteric coated Durasal Myochrisine Trigesic  Aspirin Suppositories Easprin Nalfon Trillsate  Aspirin with Codeine Ecotrin Regular or Extra Strength Naprosyn Uracel  Atromid-S Efficin Naproxen Ursinus  Auranofin Capsules Elmiron Neocylate Vanquish  Axotal Emagrin Norgesic Verin  Azathioprine Empirin or Empirin with Codeine Normiflo Vitamin E  Azolid Emprazil Nuprin Voltaren  Bayer Aspirin plain, buffered or children's or timed BC Tablets or powders Encaprin Orgaran Warfarin Sodium  Buff-a-Comp Enoxaparin Orudis Zorpin  Buff-a-Comp with Codeine Equegesic Os-Cal-Gesic   Buffaprin Excedrin plain, buffered or Extra Strength Oxalid   Bufferin Arthritis Strength Feldene  Oxphenbutazone   Bufferin plain or Extra Strength Feldene Capsules Oxycodone with Aspirin   Bufferin with Codeine Fenoprofen Fenoprofen Pabalate or Pabalate-SF   Buffets II Flogesic Panagesic   Buffinol plain or Extra Strength Florinal or Florinal with Codeine Panwarfarin   Buf-Tabs Flurbiprofen Penicillamine   Butalbital Compound Four-way cold tablets Penicillin   Butazolidin Fragmin Pepto-Bismol   Carbenicillin Geminisyn Percodan   Carna Arthritis Reliever Geopen Persantine   Carprofen Gold's salt Persistin   Chloramphenicol Goody's Phenylbutazone   Chloromycetin Haltrain Piroxlcam   Clmetidine heparin Plaquenil   Cllnoril Hyco-pap Ponstel   Clofibrate Hydroxy chloroquine Propoxyphen         Before stopping any of these medications, be sure to consult the physician who ordered them.  Some, such as Coumadin (Warfarin) are ordered to prevent or treat serious conditions such as "deep thrombosis", "pumonary embolisms", and other heart problems.  The amount of time that  you may need off of the medication may also vary with the medication and the reason for which you were taking it.  If you are taking any of these medications, please make sure you notify your pain physician before you undergo any procedures.    Facet Blocks Patient Information  Description: The facets are joints in the spine between the vertebrae.  Like any joints in the body, facets can become irritated and painful.  Arthritis can also effect the facets.  By injecting steroids and local anesthetic in and around these joints, we can temporarily block the nerve supply to them.  Steroids act directly on irritated nerves and tissues to reduce selling and inflammation which often leads to decreased pain.  Facet blocks may be done anywhere along the spine from the neck to the low back depending upon the location of your pain.   After numbing the skin with local anesthetic (like Novocaine), a small needle is passed onto the facet joints under x-ray guidance.  You may experience a sensation of pressure while this is being done.  The entire block usually lasts about 15-25 minutes.   Conditions which may be treated by facet blocks:   Low back/buttock pain  Neck/shoulder pain  Certain types of headaches  Preparation for the injection:  1. Do not eat any solid food or dairy products within 8 hours of your appointment. 2. You may drink clear liquid up to 3 hours before appointment.  Clear liquids include water, black coffee, juice or soda.  No milk or cream please. 3. You may take your regular medication, including pain medications, with a sip of water before your appointment.  Diabetics should hold regular insulin (if taken separately) and take 1/2 normal NPH dose the morning of the procedure.  Carry some sugar containing items with you to your appointment. 4. A driver must accompany you and be prepared to drive you home after your procedure. 5. Bring all your current medications with you. 6. An IV  may be inserted and sedation may be given at the discretion of the physician. 7. A blood pressure cuff, EKG and other monitors will often be applied during the procedure.  Some patients may need to have extra oxygen administered for a short period. 8. You will be asked to provide medical information, including your allergies and medications, prior to the procedure.  We must know immediately if you are taking blood thinners (like Coumadin/Warfarin) or if you are allergic to IV iodine contrast (dye).  We must know if you could possible be pregnant.  Possible side-effects:  Bleeding from needle site  Infection (rare, may require surgery)  Nerve injury (rare)  Numbness & tingling (temporary)  Difficulty urinating (rare, temporary)  Spinal headache (a headache worse with upright posture)  Light-headedness (temporary)  Pain at injection site (serveral days)  Decreased blood pressure (rare, temporary)  Weakness in arm/leg (temporary)  Pressure sensation in back/neck (temporary)   Call if you experience:   Fever/chills associated with headache or increased back/neck pain  Headache worsened by an upright position  New onset, weakness or numbness of an extremity below the injection site  Hives or difficulty breathing (go to the emergency room)  Inflammation or drainage at the injection site(s)  Severe back/neck pain greater than usual  New symptoms which are concerning to you  Please note:  Although the local anesthetic injected can often make your back or neck feel good for several hours after the injection, the pain will likely return. It takes 3-7 days for steroids to work.  You may not notice any pain relief for at least one week.  If effective, we will often do a series of 2-3 injections spaced 3-6 weeks apart to maximally decrease your pain.  After the initial series, you may be a candidate for a more permanent nerve block of the facets.  If you have any questions,  please call #[phone]) [facility]

## 2018-10-02 NOTE — Patient Instructions (Signed)
Moderate Conscious Sedation, Adult Sedation is the use of medicines to promote relaxation and relieve discomfort and anxiety. Moderate conscious sedation is a type of sedation. Under moderate conscious sedation, you are less alert than normal, but you are still able to respond to instructions, touch, or both. Moderate conscious sedation is used during short medical and dental procedures. It is milder than deep sedation, which is a type of sedation under which you cannot be easily woken up. It is also milder than general anesthesia, which is the use of medicines to make you unconscious. Moderate conscious sedation allows you to return to your regular activities sooner. Tell a health care provider about:  Any allergies you have.  All medicines you are taking, including vitamins, herbs, eye drops, creams, and over-the-counter medicines.  Use of steroids (by mouth or creams).  Any problems you or family members have had with sedatives and anesthetic medicines.  Any blood disorders you have.  Any surgeries you have had.  Any medical conditions you have, such as sleep apnea.  Whether you are pregnant or may be pregnant.  Any use of cigarettes, alcohol, marijuana, or street drugs. What are the risks? Generally, this is a safe procedure. However, problems may occur, including:  Getting too much medicine (oversedation).  Nausea.  Allergic reaction to medicines.  Trouble breathing. If this happens, a breathing tube may be used to help with breathing. It will be removed when you are awake and breathing on your own.  Heart trouble.  Lung trouble.  What happens before the procedure? Staying hydrated Follow instructions from your health care provider about hydration, which may include:  Up to 2 hours before the procedure - you may continue to drink clear liquids, such as water, clear fruit juice, black coffee, and plain tea.  Eating and drinking restrictions Follow instructions from  your health care provider about eating and drinking, which may include:  8 hours before the procedure - stop eating heavy meals or foods such as meat, fried foods, or fatty foods.  6 hours before the procedure - stop eating light meals or foods, such as toast or cereal.  6 hours before the procedure - stop drinking milk or drinks that contain milk.  2 hours before the procedure - stop drinking clear liquids.  Medicine  Ask your health care provider about:  Changing or stopping your regular medicines. This is especially important if you are taking diabetes medicines or blood thinners.  Taking medicines such as aspirin and ibuprofen. These medicines can thin your blood. Do not take these medicines before your procedure if your health care provider instructs you not to.  Tests and exams  You will have a physical exam.  You may have blood tests done to show: ? How well your kidneys and liver are working. ? How well your blood can clot. General instructions  Plan to have someone take you home from the hospital or clinic.  If you will be going home right after the procedure, plan to have someone with you for 24 hours. What happens during the procedure?  An IV tube will be inserted into one of your veins.  Medicine to help you relax (sedative) will be given through the IV tube.  The medical or dental procedure will be performed. What happens after the procedure?  Your blood pressure, heart rate, breathing rate, and blood oxygen level will be monitored often until the medicines you were given have worn off.  Do not drive for 24 hours.   This information is not intended to replace advice given to you by your health care provider. Make sure you discuss any questions you have with your health care provider. Document Released: 09/11/2001 Document Revised: 05/22/2016 Document Reviewed: 04/07/2016 [employer] Interactive Patient Education  2018 [employer] GENERAL RISKS AND  COMPLICATIONS  What are the risk, side effects and possible complications? Generally speaking, most procedures are safe.  However, with any procedure there are risks, side effects, and the possibility of complications.  The risks and complications are dependent upon the sites that are lesioned, or the type of nerve block to be performed.  The closer the procedure is to the spine, the more serious the risks are.  Great care is taken when placing the radio frequency needles, block needles or lesioning probes, but sometimes complications can occur. 1. Infection: Any time there is an injection through the skin, there is a risk of infection.  This is why sterile conditions are used for these blocks.  There are four possible types of infection. 1. Localized skin infection. 2. Central Nervous System Infection-This can be in the form of Meningitis, which can be deadly. 3. Epidural Infections-This can be in the form of an epidural abscess, which can cause pressure inside of the spine, causing compression of the spinal cord with subsequent paralysis. This would require an emergency surgery to decompress, and there are no guarantees that the patient would recover from the paralysis. 4. Discitis-This is an infection of the intervertebral discs.  It occurs in about 1% of discography procedures.  It is difficult to treat and it may lead to surgery.        2. Pain: the needles have to go through skin and soft tissues, will cause soreness.       3. Damage to internal structures:  The nerves to be lesioned may be near blood vessels or    other nerves which can be potentially damaged.       4. Bleeding: Bleeding is more common if the patient is taking blood thinners such as  aspirin, Coumadin, Ticiid, Plavix, etc., or if he/she have some genetic predisposition  such as hemophilia. Bleeding into the spinal canal can cause compression of the spinal  cord with subsequent paralysis.  This would require an emergency surgery  to  decompress and there are no guarantees that the patient would recover from the  paralysis.       5. Pneumothorax:  Puncturing of a lung is a possibility, every time a needle is introduced in  the area of the chest or upper back.  Pneumothorax refers to free air around the  collapsed lung(s), inside of the thoracic cavity (chest cavity).  Another two possible  complications related to a similar event would include: Hemothorax and Chylothorax.   These are variations of the Pneumothorax, where instead of air around the collapsed  lung(s), you may have blood or chyle, respectively.       6. Spinal headaches: They may occur with any procedures in the area of the spine.       7. Persistent CSF (Cerebro-Spinal Fluid) leakage: This is a rare problem, but may occur  with prolonged intrathecal or epidural catheters either due to the formation of a fistulous  track or a dural tear.       8. Nerve damage: By working so close to the spinal cord, there is always a possibility of  nerve damage, which could be as serious as a permanent spinal cord injury with    paralysis.       9. Death:  Although rare, severe deadly allergic reactions known as "Anaphylactic  reaction" can occur to any of the medications used.      10. Worsening of the symptoms:  We can always make thing worse.  What are the chances of something like this happening? Chances of any of this occuring are extremely low.  By statistics, you have more of a chance of getting killed in a motor vehicle accident: while driving to the hospital than any of the above occurring .  Nevertheless, you should be aware that they are possibilities.  In general, it is similar to taking a shower.  Everybody knows that you can slip, hit your head and get killed.  Does that mean that you should not shower again?  Nevertheless always keep in mind that statistics do not mean anything if you happen to be on the wrong side of them.  Even if a procedure has a 1 (one) in a 1,000,000  (million) chance of going wrong, it you happen to be that one..Also, keep in mind that by statistics, you have more of a chance of having something go wrong when taking medications.  Who should not have this procedure? If you are on a blood thinning medication (e.g. Coumadin, Plavix, see list of "Blood Thinners"), or if you have an active infection going on, you should not have the procedure.  If you are taking any blood thinners, please inform your physician.  How should I prepare for this procedure?  Do not eat or drink anything at least six hours prior to the procedure.  Bring a driver with you .  It cannot be a taxi.  Come accompanied by an adult that can drive you back, and that is strong enough to help you if your legs get weak or numb from the local anesthetic.  Take all of your medicines the morning of the procedure with just enough water to swallow them.  If you have diabetes, make sure that you are scheduled to have your procedure done first thing in the morning, whenever possible.  If you have diabetes, take only half of your insulin dose and notify our nurse that you have done so as soon as you arrive at the clinic.  If you are diabetic, but only take blood sugar pills (oral hypoglycemic), then do not take them on the morning of your procedure.  You may take them after you have had the procedure.  Do not take aspirin or any aspirin-containing medications, at least eleven (11) days prior to the procedure.  They may prolong bleeding.  Wear loose fitting clothing that may be easy to take off and that you would not mind if it got stained with Betadine or blood.  Do not wear any jewelry or perfume  Remove any nail coloring.  It will interfere with some of our monitoring equipment.  NOTE: Remember that this is not meant to be interpreted as a complete list of all possible complications.  Unforeseen problems may occur.  BLOOD THINNERS The following drugs contain aspirin or other  products, which can cause increased bleeding during surgery and should not be taken for 2 weeks prior to and 1 week after surgery.  If you should need take something for relief of minor pain, you may take acetaminophen which is found in Tylenol,m Datril, Anacin-3 and Panadol. It is not blood thinner. The products listed below are.  Do not take any of the products listed   below in addition to any listed on your instruction sheet.  A.P.C or A.P.C with Codeine Codeine Phosphate Capsules #3 Ibuprofen Ridaura  ABC compound Congesprin Imuran rimadil  Advil Cope Indocin Robaxisal  Alka-Seltzer Effervescent Pain Reliever and Antacid Coricidin or Coricidin-D  Indomethacin Rufen  Alka-Seltzer plus Cold Medicine Cosprin Ketoprofen S-A-C Tablets  Anacin Analgesic Tablets or Capsules Coumadin Korlgesic Salflex  Anacin Extra Strength Analgesic tablets or capsules CP-2 Tablets Lanoril Salicylate  Anaprox Cuprimine Capsules Levenox Salocol  Anexsia-D Dalteparin Magan Salsalate  Anodynos Darvon compound Magnesium Salicylate Sine-off  Ansaid Dasin Capsules Magsal Sodium Salicylate  Anturane Depen Capsules Marnal Soma  APF Arthritis pain formula Dewitt's Pills Measurin Stanback  Argesic Dia-Gesic Meclofenamic Sulfinpyrazone  Arthritis Bayer Timed Release Aspirin Diclofenac Meclomen Sulindac  Arthritis pain formula Anacin Dicumarol Medipren Supac  Analgesic (Safety coated) Arthralgen Diffunasal Mefanamic Suprofen  Arthritis Strength Bufferin Dihydrocodeine Mepro Compound Suprol  Arthropan liquid Dopirydamole Methcarbomol with Aspirin Synalgos  ASA tablets/Enseals Disalcid Micrainin Tagament  Ascriptin Doan's Midol Talwin  Ascriptin A/D Dolene Mobidin Tanderil  Ascriptin Extra Strength Dolobid Moblgesic Ticlid  Ascriptin with Codeine Doloprin or Doloprin with Codeine Momentum Tolectin  Asperbuf Duoprin Mono-gesic Trendar  Aspergum Duradyne Motrin or Motrin IB Triminicin  Aspirin plain, buffered or enteric  coated Durasal Myochrisine Trigesic  Aspirin Suppositories Easprin Nalfon Trillsate  Aspirin with Codeine Ecotrin Regular or Extra Strength Naprosyn Uracel  Atromid-S Efficin Naproxen Ursinus  Auranofin Capsules Elmiron Neocylate Vanquish  Axotal Emagrin Norgesic Verin  Azathioprine Empirin or Empirin with Codeine Normiflo Vitamin E  Azolid Emprazil Nuprin Voltaren  Bayer Aspirin plain, buffered or children's or timed BC Tablets or powders Encaprin Orgaran Warfarin Sodium  Buff-a-Comp Enoxaparin Orudis Zorpin  Buff-a-Comp with Codeine Equegesic Os-Cal-Gesic   Buffaprin Excedrin plain, buffered or Extra Strength Oxalid   Bufferin Arthritis Strength Feldene Oxphenbutazone   Bufferin plain or Extra Strength Feldene Capsules Oxycodone with Aspirin   Bufferin with Codeine Fenoprofen Fenoprofen Pabalate or Pabalate-SF   Buffets II Flogesic Panagesic   Buffinol plain or Extra Strength Florinal or Florinal with Codeine Panwarfarin   Buf-Tabs Flurbiprofen Penicillamine   Butalbital Compound Four-way cold tablets Penicillin   Butazolidin Fragmin Pepto-Bismol   Carbenicillin Geminisyn Percodan   Carna Arthritis Reliever Geopen Persantine   Carprofen Gold's salt Persistin   Chloramphenicol Goody's Phenylbutazone   Chloromycetin Haltrain Piroxlcam   Clmetidine heparin Plaquenil   Cllnoril Hyco-pap Ponstel   Clofibrate Hydroxy chloroquine Propoxyphen         Before stopping any of these medications, be sure to consult the physician who ordered them.  Some, such as Coumadin (Warfarin) are ordered to prevent or treat serious conditions such as "deep thrombosis", "pumonary embolisms", and other heart problems.  The amount of time that you may need off of the medication may also vary with the medication and the reason for which you were taking it.  If you are taking any of these medications, please make sure you notify your pain physician before you undergo any procedures.         Facet  Blocks Patient Information  Description: The facets are joints in the spine between the vertebrae.  Like any joints in the body, facets can become irritated and painful.  Arthritis can also effect the facets.  By injecting steroids and local anesthetic in and around these joints, we can temporarily block the nerve supply to them.  Steroids act directly on irritated nerves and tissues to reduce selling and inflammation which often leads to decreased pain.    Facet blocks may be done anywhere along the spine from the neck to the low back depending upon the location of your pain.   After numbing the skin with local anesthetic (like Novocaine), a small needle is passed onto the facet joints under x-ray guidance.  You may experience a sensation of pressure while this is being done.  The entire block usually lasts about 15-25 minutes.   Conditions which may be treated by facet blocks:   Low back/buttock pain  Neck/shoulder pain  Certain types of headaches  Preparation for the injection:  1. Do not eat any solid food or dairy products within 8 hours of your appointment. 2. You may drink clear liquid up to 3 hours before appointment.  Clear liquids include water, black coffee, juice or soda.  No milk or cream please. 3. You may take your regular medication, including pain medications, with a sip of water before your appointment.  Diabetics should hold regular insulin (if taken separately) and take 1/2 normal NPH dose the morning of the procedure.  Carry some sugar containing items with you to your appointment. 4. A driver must accompany you and be prepared to drive you home after your procedure. 5. Bring all your current medications with you. 6. An IV may be inserted and sedation may be given at the discretion of the physician. 7. A blood pressure cuff, EKG and other monitors will often be applied during the procedure.  Some patients may need to have extra oxygen administered for a short period. 8. You  will be asked to provide medical information, including your allergies and medications, prior to the procedure.  We must know immediately if you are taking blood thinners (like Coumadin/Warfarin) or if you are allergic to IV iodine contrast (dye).  We must know if you could possible be pregnant.  Possible side-effects:   Bleeding from needle site  Infection (rare, may require surgery)  Nerve injury (rare)  Numbness & tingling (temporary)  Difficulty urinating (rare, temporary)  Spinal headache (a headache worse with upright posture)  Light-headedness (temporary)  Pain at injection site (serveral days)  Decreased blood pressure (rare, temporary)  Weakness in arm/leg (temporary)  Pressure sensation in back/neck (temporary)   Call if you experience:   Fever/chills associated with headache or increased back/neck pain  Headache worsened by an upright position  New onset, weakness or numbness of an extremity below the injection site  Hives or difficulty breathing (go to the emergency room)  Inflammation or drainage at the injection site(s)  Severe back/neck pain greater than usual  New symptoms which are concerning to you  Please note:  Although the local anesthetic injected can often make your back or neck feel good for several hours after the injection, the pain will likely return. It takes 3-7 days for steroids to work.  You may not notice any pain relief for at least one week.  If effective, we will often do a series of 2-3 injections spaced 3-6 weeks apart to maximally decrease your pain.  After the initial series, you may be a candidate for a more permanent nerve block of the facets.  If you have any questions, please call [phone] [facility] 

## 2018-10-09 LAB — COMPLIANCE DRUG ANALYSIS, UR

## 2018-10-27 ENCOUNTER — Encounter: Payer: Self-pay | Admitting: Student in an Organized Health Care Education/Training Program

## 2018-10-27 ENCOUNTER — Other Ambulatory Visit: Payer: Self-pay

## 2018-10-27 ENCOUNTER — Ambulatory Visit
Admission: RE | Admit: 2018-10-27 | Discharge: 2018-10-27 | Disposition: A | Discharge status: Home / Self Care | Payer: BC Managed Care – PPO | Source: Ambulatory Visit | Attending: Student in an Organized Health Care Education/Training Program | Admitting: Student in an Organized Health Care Education/Training Program

## 2018-10-27 ENCOUNTER — Ambulatory Visit (HOSPITAL_BASED_OUTPATIENT_CLINIC_OR_DEPARTMENT_OTHER): Payer: BC Managed Care – PPO | Admitting: Student in an Organized Health Care Education/Training Program

## 2018-10-27 VITALS — BP 140/62 | HR 64 | Temp 97.6°F | Resp 16 | Ht 67.0 in | Wt 160.0 lb

## 2018-10-27 DIAGNOSIS — Z9889 Other specified postprocedural states: Secondary | ICD-10-CM | POA: Diagnosis not present

## 2018-10-27 DIAGNOSIS — M549 Dorsalgia, unspecified: Secondary | ICD-10-CM | POA: Diagnosis present

## 2018-10-27 DIAGNOSIS — M47816 Spondylosis without myelopathy or radiculopathy, lumbar region: Secondary | ICD-10-CM

## 2018-10-27 MED ORDER — DEXAMETHASONE SODIUM PHOSPHATE 10 MG/ML IJ SOLN
10.0000 mg | Freq: Once | INTRAMUSCULAR | Status: AC
  Administered 2018-10-27: 10 mg
  Filled 2018-10-27: qty 1

## 2018-10-27 MED ORDER — ROPIVACAINE HCL 2 MG/ML IJ SOLN
10.0000 mL | Freq: Once | INTRAMUSCULAR | Status: AC
  Administered 2018-10-27: 10 mL
  Filled 2018-10-27: qty 10

## 2018-10-27 MED ORDER — FENTANYL CITRATE (PF) 100 MCG/2ML IJ SOLN
25.0000 ug | INTRAMUSCULAR | Status: DC | PRN
  Administered 2018-10-27: 75 ug via INTRAVENOUS
  Filled 2018-10-27: qty 2

## 2018-10-27 MED ORDER — LACTATED RINGERS IV SOLN
1000.0000 mL | Freq: Once | INTRAVENOUS | Status: AC
  Administered 2018-10-27: 1000 mL via INTRAVENOUS

## 2018-10-27 MED ORDER — LIDOCAINE HCL 2 % IJ SOLN
20.0000 mL | Freq: Once | INTRAMUSCULAR | Status: AC
  Administered 2018-10-27: 400 mg
  Filled 2018-10-27: qty 40

## 2018-10-27 NOTE — Progress Notes (Signed)
Safety precautions to be maintained throughout the outpatient stay will include: orient to surroundings, keep bed in low position, maintain call bell within reach at all times, provide assistance with transfer out of bed and ambulation.  

## 2018-10-27 NOTE — Patient Instructions (Signed)

## 2018-10-27 NOTE — Progress Notes (Signed)
Patient's Name: Vanessa Greer  MRN: [id number]  Referring Provider: Lynnea Ferrier, MD  DOB: 1961/07/13  PCP: Lynnea Ferrier, MD  DOS: 10/27/2018  Note by: Edward Jolly, MD  Service setting: Ambulatory outpatient  Specialty: Interventional Pain Management  Patient type: Established  Location: [facility] (AMB) Pain Management Facility  Visit type: Interventional Procedure   Primary Reason for Visit: Interventional Pain Management Treatment. CC: Back Pain (lower)  Procedure:          Anesthesia, Analgesia, Anxiolysis:  Type: Lumbar Facet, Medial Branch Block(s)          Primary Purpose: Diagnostic Region: Posterolateral Lumbosacral Spine Level:  L3, L4, L5, Medial Branch Level(s). Injecting these levels blocks the L3-4, L4-5 lumbar facet joints. Laterality: Bilateral  Type: Moderate (Conscious) Sedation combined with Local Anesthesia Indication(s): Analgesia and Anxiety Route: Intravenous (IV) IV Access: Secured Sedation: Meaningful verbal contact was maintained at all times during the procedure  Local Anesthetic: Lidocaine 1-2%  Position: Prone   Indications: 1. Lumbar facet arthropathy   2. Lumbar spondylosis    Pain Score: Pre-procedure: 6 /10 Post-procedure: 0-No pain/10  Pre-op Assessment:  Ms. Helm is a 57 y.o. (year old), female patient, seen today for interventional treatment. She  has a past surgical history that includes Abdominal hysterectomy; Foot surgery; Ectopic pregnancy surgery; Colonoscopy; and Laser ablation condolamata (N/A, 12/13/2017). Ms. Barrientes has a current medication list which includes the following prescription(s): clonazepam, cyclobenzaprine, hydrocodone-acetaminophen, lyrica, multivitamin with minerals, omega-3 fatty acids, pantoprazole, sertraline, simvastatin, turmeric, and vitamin d3, and the following Facility-Administered Medications: fentanyl. Her primarily concern today is the Back Pain (lower)  Initial Vital Signs:  Pulse/HCG Rate: 68ECG Heart  Rate: 69 Temp: 98.3 F (36.8 C) Resp: 16 BP: 115/81 SpO2: 100 %  BMI: Estimated body mass index is 25.06 kg/m as calculated from the following:   Height as of this encounter: 5\' 7"  (1.702 m).   Weight as of this encounter: 160 lb (72.6 kg).  Risk Assessment: Allergies: Reviewed. She is allergic to bupropion.  Allergy Precautions: None required Coagulopathies: Reviewed. None identified.  Blood-thinner therapy: None at this time Active Infection(s): Reviewed. None identified. Ms. Beckers is afebrile  Site Confirmation: Ms. Stalder was asked to confirm the procedure and laterality before marking the site Procedure checklist: Completed Consent: Before the procedure and under the influence of no sedative(s), amnesic(s), or anxiolytics, the patient was informed of the treatment options, risks and possible complications. To fulfill our ethical and legal obligations, as recommended by the American Medical Association's Code of Ethics, I have informed the patient of my clinical impression; the nature and purpose of the treatment or procedure; the risks, benefits, and possible complications of the intervention; the alternatives, including doing nothing; the risk(s) and benefit(s) of the alternative treatment(s) or procedure(s); and the risk(s) and benefit(s) of doing nothing. The patient was provided information about the general risks and possible complications associated with the procedure. These may include, but are not limited to: failure to achieve desired goals, infection, bleeding, organ or nerve damage, allergic reactions, paralysis, and death. In addition, the patient was informed of those risks and complications associated to Spine-related procedures, such as failure to decrease pain; infection (i.e.: Meningitis, epidural or intraspinal abscess); bleeding (i.e.: epidural hematoma, subarachnoid hemorrhage, or any other type of intraspinal or peri-dural bleeding); organ or nerve damage (i.e.:  Any type of peripheral nerve, nerve root, or spinal cord injury) with subsequent damage to sensory, motor, and/or autonomic systems, resulting in permanent  pain, numbness, and/or weakness of one or several areas of the body; allergic reactions; (i.e.: anaphylactic reaction); and/or death. Furthermore, the patient was informed of those risks and complications associated with the medications. These include, but are not limited to: allergic reactions (i.e.: anaphylactic or anaphylactoid reaction(s)); adrenal axis suppression; blood sugar elevation that in diabetics may result in ketoacidosis or comma; water retention that in patients with history of congestive heart failure may result in shortness of breath, pulmonary edema, and decompensation with resultant heart failure; weight gain; swelling or edema; medication-induced neural toxicity; particulate matter embolism and blood vessel occlusion with resultant organ, and/or nervous system infarction; and/or aseptic necrosis of one or more joints. Finally, the patient was informed that Medicine is not an exact science; therefore, there is also the possibility of unforeseen or unpredictable risks and/or possible complications that may result in a catastrophic outcome. The patient indicated having understood very clearly. We have given the patient no guarantees and we have made no promises. Enough time was given to the patient to ask questions, all of which were answered to the patient's satisfaction. Ms. Korell has indicated that she wanted to continue with the procedure. Attestation: I, the ordering provider, attest that I have discussed with the patient the benefits, risks, side-effects, alternatives, likelihood of achieving goals, and potential problems during recovery for the procedure that I have provided informed consent. Date  Time: 10/27/2018 10:10 AM  Pre-Procedure Preparation:  Monitoring: As per clinic protocol. Respiration, ETCO2, SpO2, BP, heart rate  and rhythm monitor placed and checked for adequate function Safety Precautions: Patient was assessed for positional comfort and pressure points before starting the procedure. Time-out: I initiated and conducted the "Time-out" before starting the procedure, as per protocol. The patient was asked to participate by confirming the accuracy of the "Time Out" information. Verification of the correct person, site, and procedure were performed and confirmed by me, the nursing staff, and the patient. "Time-out" conducted as per Joint Commission's Universal Protocol (UP.01.01.01). Time: 1124  Description of Procedure:          Laterality: Bilateral. The procedure was performed in identical fashion on both sides. Levels:   L3, L4, L5, Medial Branch Level(s) Area Prepped: Posterior Lumbosacral Region Prepping solution: ChloraPrep (2% chlorhexidine gluconate and 70% isopropyl alcohol) Safety Precautions: Aspiration looking for blood return was conducted prior to all injections. At no point did we inject any substances, as a needle was being advanced. Before injecting, the patient was told to immediately notify me if she was experiencing any new onset of "ringing in the ears, or metallic taste in the mouth". No attempts were made at seeking any paresthesias. Safe injection practices and needle disposal techniques used. Medications properly checked for expiration dates. SDV (single dose vial) medications used. After the completion of the procedure, all disposable equipment used was discarded in the proper designated medical waste containers. Local Anesthesia: Protocol guidelines were followed. The patient was positioned over the fluoroscopy table. The area was prepped in the usual manner. The time-out was completed. The target area was identified using fluoroscopy. A 12-in long, straight, sterile hemostat was used with fluoroscopic guidance to locate the targets for each level blocked. Once located, the skin was marked  with an approved surgical skin marker. Once all sites were marked, the skin (epidermis, dermis, and hypodermis), as well as deeper tissues (fat, connective tissue and muscle) were infiltrated with a small amount of a short-acting local anesthetic, loaded on a 10cc syringe with a 25G, 1.5-in  Needle. An appropriate amount of time was allowed for local anesthetics to take effect before proceeding to the next step. Local Anesthetic: Lidocaine 2.0% The unused portion of the local anesthetic was discarded in the proper designated containers. Technical explanation of process:     L3 Medial Branch Nerve Block (MBB): The target area for the L3 medial branch is at the junction of the postero-lateral aspect of the superior articular process and the superior, posterior, and medial edge of the transverse process of L4. Under fluoroscopic guidance, a Quincke needle was inserted until contact was made with os over the superior postero-lateral aspect of the pedicular shadow (target area). After negative aspiration for blood, 0.5 mL of the nerve block solution was injected without difficulty or complication. The needle was removed intact. L4 Medial Branch Nerve Block (MBB): The target area for the L4 medial branch is at the junction of the postero-lateral aspect of the superior articular process and the superior, posterior, and medial edge of the transverse process of L5. Under fluoroscopic guidance, a Quincke needle was inserted until contact was made with os over the superior postero-lateral aspect of the pedicular shadow (target area). After negative aspiration for blood, 0.5 mL of the nerve block solution was injected without difficulty or complication. The needle was removed intact. L5 Medial Branch Nerve Block (MBB): The target area for the L5 medial branch is at the junction of the postero-lateral aspect of the superior articular process and the superior, posterior, and medial edge of the sacral ala. Under  fluoroscopic guidance, a Quincke needle was inserted until contact was made with os over the superior postero-lateral aspect of the pedicular shadow (target area). After negative aspiration for blood, 0.5 mL of the nerve block solution was injected without difficulty or complication. The needle was removed intact.  Procedural Needles: 22-gauge, 3.5-inch, Quincke needles used for all levels. Nerve block solution: 8 cc solution made of 7 cc of 0.2% Ropivacaine and 1 cc of Decadron 10mg /cc. 1 cc injected at each level above bilaterally.The unused portion of the solution was discarded in the proper designated containers.  Once the entire procedure was completed, the treated area was cleaned, making sure to leave some of the prepping solution back to take advantage of its long term bactericidal properties.   Illustration of the posterior view of the lumbar spine and the posterior neural structures. Laminae of L2 through S1 are labeled. DPRL5, dorsal primary ramus of L5; DPRS1, dorsal primary ramus of S1; DPR3, dorsal primary ramus of L3; FJ, facet (zygapophyseal) joint L3-L4; I, inferior articular process of L4; LB1, lateral branch of dorsal primary ramus of L1; IAB, inferior articular branches from L3 medial branch (supplies L4-L5 facet joint); IBP, intermediate branch plexus; MB3, medial branch of dorsal primary ramus of L3; NR3, third lumbar nerve root; S, superior articular process of L5; SAB, superior articular branches from L4 (supplies L4-5 facet joint also); TP3, transverse process of L3.  Vitals:   10/27/18 1140 10/27/18 1151 10/27/18 1202 10/27/18 1213  BP: 132/63 139/65 (!) 141/61 140/62  Pulse: 64     Resp: 12 20 16 16   Temp:  97.6 F (36.4 C)    TempSrc:      SpO2: 99% 95% 98% 98%  Weight:      Height:         Start Time: 1124 hrs. End Time: 1138 hrs.  Imaging Guidance (Spinal):          Type of Imaging Technique: Fluoroscopy Guidance (Spinal) Indication(s):  Assistance in needle  guidance and placement for procedures requiring needle placement in or near specific anatomical locations not easily accessible without such assistance. Exposure Time: Please see nurses notes. Contrast: None used. Fluoroscopic Guidance: I was personally present during the use of fluoroscopy. "Tunnel Vision Technique" used to obtain the best possible view of the target area. Parallax error corrected before commencing the procedure. "Direction-depth-direction" technique used to introduce the needle under continuous pulsed fluoroscopy. Once target was reached, antero-posterior, oblique, and lateral fluoroscopic projection used confirm needle placement in all planes. Images permanently stored in EMR. Interpretation: No contrast injected. I personally interpreted the imaging intraoperatively. Adequate needle placement confirmed in multiple planes. Permanent images saved into the patient's record.  Antibiotic Prophylaxis:   Anti-infectives (From admission, onward)   None     Indication(s): None identified  Post-operative Assessment:  Post-procedure Vital Signs:  Pulse/HCG Rate: 6474 Temp: 97.6 F (36.4 C) Resp: 16 BP: 140/62 SpO2: 98 %  EBL: None  Complications: No immediate post-treatment complications observed by team, or reported by patient.  Note: The patient tolerated the entire procedure well. A repeat set of vitals were taken after the procedure and the patient was kept under observation following institutional policy, for this type of procedure. Post-procedural neurological assessment was performed, showing return to baseline, prior to discharge. The patient was provided with post-procedure discharge instructions, including a section on how to identify potential problems. Should any problems arise concerning this procedure, the patient was given instructions to immediately contact us, at any time, without hesitation. In any case, we plan to contact the patient by telephone for a  follow-up status report regarding this interventional procedure.  Comments:  No additional relevant information. 5 out of 5 strength bilateral lower extremity: Plantar flexion, dorsiflexion, knee flexion, knee extension.  Plan of Care   Imaging Orders     DG C-Arm 1-60 Min-No Report Procedure Orders    No procedure(s) ordered today    Medications ordered for procedure: Meds ordered this encounter  Medications  . fentaNYL (SUBLIMAZE) injection 25-100 mcg    Make sure Narcan is available in the pyxis when using this medication. In the event of respiratory depression (RR< 8/min): Titrate NARCAN (naloxone) in increments of 0.1 to 0.2 mg IV at 2-3 minute intervals, until desired degree of reversal.  . ropivacaine (PF) 2 mg/mL (0.2%) (NAROPIN) injection 10 mL  . lidocaine (XYLOCAINE) 2 % (with pres) injection 400 mg  . dexamethasone (DECADRON) injection 10 mg  . lactated ringers infusion 1,000 mL   Medications administered: We administered fentaNYL, ropivacaine (PF) 2 mg/mL (0.2%), lidocaine, dexamethasone, and lactated ringers.  See the medical record for exact dosing, route, and time of administration.  Disposition: Discharge home  Discharge Date & Time: 10/27/2018; 1214 hrs.   Physician-requested Follow-up: Return in about 22 days (around 11/18/2018) for Post Procedure Evaluation, Medication Management. UDS at next visit, can take over COT.  Future Appointments  Date Time Provider Department Center  11/18/2018 10:15 AM Edward Jolly, MD [facility] None   Primary Care Physician: Lynnea Ferrier, MD Location: [facility] Facility Note by: Edward Jolly, MD Date: 10/27/2018; Time: 12:57 PM  Disclaimer:  Medicine is not an exact science. The only guarantee in medicine is that nothing is guaranteed. It is important to note that the decision to proceed with this intervention was based on the information collected from the patient. The Data and conclusions were  drawn from the patient's questionnaire, the interview, and the physical  examination. Because the information was provided in large part by the patient, it cannot be guaranteed that it has not been purposely or unconsciously manipulated. Every effort has been made to obtain as much relevant data as possible for this evaluation. It is important to note that the conclusions that lead to this procedure are derived in large part from the available data. Always take into account that the treatment will also be dependent on availability of resources and existing treatment guidelines, considered by other Pain Management Practitioners as being common knowledge and practice, at the time of the intervention. For Medico-Legal purposes, it is also important to point out that variation in procedural techniques and pharmacological choices are the acceptable norm. The indications, contraindications, technique, and results of the above procedure should only be interpreted and judged by a Board-Certified Interventional Pain Specialist with extensive familiarity and expertise in the same exact procedure and technique.

## 2018-10-28 ENCOUNTER — Telehealth: Payer: Self-pay

## 2018-10-28 NOTE — Telephone Encounter (Signed)
Post procedure phone call.  LM 

## 2018-10-29 ENCOUNTER — Ambulatory Visit: Payer: BC Managed Care – PPO | Admitting: Student in an Organized Health Care Education/Training Program

## 2018-11-18 ENCOUNTER — Encounter: Payer: Self-pay | Admitting: Student in an Organized Health Care Education/Training Program

## 2018-11-18 ENCOUNTER — Other Ambulatory Visit: Payer: Self-pay

## 2018-11-18 ENCOUNTER — Ambulatory Visit
Payer: BC Managed Care – PPO | Attending: Student in an Organized Health Care Education/Training Program | Admitting: Student in an Organized Health Care Education/Training Program

## 2018-11-18 VITALS — BP 107/67 | HR 88 | Temp 98.1°F | Resp 16 | Ht 67.0 in | Wt 160.0 lb

## 2018-11-18 DIAGNOSIS — M5441 Lumbago with sciatica, right side: Secondary | ICD-10-CM | POA: Diagnosis not present

## 2018-11-18 DIAGNOSIS — G629 Polyneuropathy, unspecified: Secondary | ICD-10-CM | POA: Diagnosis not present

## 2018-11-18 DIAGNOSIS — F419 Anxiety disorder, unspecified: Secondary | ICD-10-CM | POA: Diagnosis not present

## 2018-11-18 DIAGNOSIS — M5136 Other intervertebral disc degeneration, lumbar region: Secondary | ICD-10-CM | POA: Diagnosis not present

## 2018-11-18 DIAGNOSIS — E7800 Pure hypercholesterolemia, unspecified: Secondary | ICD-10-CM | POA: Diagnosis not present

## 2018-11-18 DIAGNOSIS — M47816 Spondylosis without myelopathy or radiculopathy, lumbar region: Secondary | ICD-10-CM | POA: Insufficient documentation

## 2018-11-18 DIAGNOSIS — G894 Chronic pain syndrome: Secondary | ICD-10-CM | POA: Insufficient documentation

## 2018-11-18 DIAGNOSIS — F17210 Nicotine dependence, cigarettes, uncomplicated: Secondary | ICD-10-CM | POA: Diagnosis not present

## 2018-11-18 DIAGNOSIS — Z79899 Other long term (current) drug therapy: Secondary | ICD-10-CM

## 2018-11-18 DIAGNOSIS — F329 Major depressive disorder, single episode, unspecified: Secondary | ICD-10-CM | POA: Diagnosis not present

## 2018-11-18 DIAGNOSIS — M5442 Lumbago with sciatica, left side: Secondary | ICD-10-CM | POA: Insufficient documentation

## 2018-11-18 DIAGNOSIS — K219 Gastro-esophageal reflux disease without esophagitis: Secondary | ICD-10-CM | POA: Insufficient documentation

## 2018-11-18 MED ORDER — HYDROCODONE-ACETAMINOPHEN 7.5-325 MG PO TABS: 1.0000 | ORAL_TABLET | Freq: Three times a day (TID) | ORAL | 0 refills | Status: DC | PRN

## 2018-11-18 NOTE — Progress Notes (Signed)
Patient's Name: Vanessa Greer  MRN: [id number]  Referring Provider: Adin Hector, MD  DOB: August 20, 1961  PCP: Adin Hector, MD  DOS: 11/18/2018  Note by: Gillis Santa, MD  Service setting: Ambulatory outpatient  Specialty: Interventional Pain Management  Location: [facility] (AMB) Pain Management Facility    Patient type: Established   Primary Reason(s) for Visit: Encounter for post-procedure evaluation of chronic illness with mild to moderate exacerbation CC: Back Pain (lower)  HPI  Vanessa Greer is a 57 y.o. year old, female patient, who comes today for a post-procedure evaluation. 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 Her primarily concern today is the Back Pain (lower)  Pain Assessment: Location: Lower   Radiating: through hips and thighs Onset: More than a month ago Duration: Chronic pain Quality: Aching, Burning(pain increases with severity as day progresses) Severity: 0-No pain/10 (subjective, self-reported pain score)  Note: Reported level is compatible with observation.                         When using our objective Pain Scale, levels between 6 and 10/10 are said to belong in an emergency room, as it progressively worsens from a 6/10, described as severely limiting, requiring emergency care not usually available at an outpatient pain management facility. At a 6/10 level, communication becomes difficult and requires great effort. Assistance to reach the emergency department may be required. Facial flushing and profuse sweating along with potentially dangerous increases in heart rate and blood pressure will be evident. Effect on ADL: low energy Timing: Intermittent Modifying factors:  procedure, meds BP: 107/67  HR: 88  Vanessa Greer comes in today for post-procedure evaluation.  Further details on both, my assessment(s), as well as the proposed treatment plan, please see below.  Post-Procedure Assessment  10/27/2018 Procedure: Bilateral L3, L4, L5 facet medial branch nerve block Pre-procedure pain score:  6/10 Post-procedure pain score: 0/10         Influential Factors: BMI: 25.06 kg/m Intra-procedural challenges: None observed.         Assessment challenges: None detected.              Reported side-effects: None.        Post-procedural adverse reactions or complications: None reported         Sedation: Please see nurses note. When no sedatives are used, the analgesic levels obtained are directly associated to the effectiveness of the local anesthetics. However, when sedation is provided, the level of analgesia obtained during the initial 1 hour following the intervention, is believed to be the result of a combination of factors. These factors may include, but are not limited to: 1. The effectiveness of the local anesthetics used. 2. The effects of the analgesic(s) and/or anxiolytic(s) used. 3. The degree of discomfort experienced by the patient at the time of the procedure. 4. The patients ability and reliability in recalling and recording the events. 5. The presence and influence of possible secondary gains and/or psychosocial factors. Reported result: Relief experienced during the 1st hour after the procedure: 100 % (Ultra-Short Term Relief)            Interpretative annotation: Clinically appropriate result. Analgesia during this period is likely to be Local Anesthetic and/or IV Sedative (  Analgesic/Anxiolytic) related.          Effects of local anesthetic: The analgesic effects attained during this period are directly associated to the localized infiltration of local anesthetics and therefore cary significant diagnostic value as to the etiological location, or  anatomical origin, of the pain. Expected duration of relief is directly dependent on the pharmacodynamics of the local anesthetic used. Long-acting (4-6 hours) anesthetics used.  Reported result: Relief during the next 4 to 6 hour after the procedure: 100 % (Short-Term Relief)            Interpretative annotation: Clinically appropriate result. Analgesia during this period is likely to be Local Anesthetic-related.          Long-term benefit: Defined as the period of time past the expected duration of local anesthetics (1 hour for short-acting and 4-6 hours for long-acting). With the possible exception of prolonged sympathetic blockade from the local anesthetics, benefits during this period are typically attributed to, or associated with, other factors such as analgesic sensory neuropraxia, antiinflammatory effects, or beneficial biochemical changes provided by agents other than the local anesthetics.  Reported result: Extended relief following procedure: 100 %(soreness returned 6 hours later; now wakes up without pain but pain returns and gets progressively worse as the day goes on) (Long-Term Relief)            Interpretative annotation: Clinically possible results. Good relief. No permanent benefit expected. Inflammation plays a part in the etiology to the pain.          Current benefits: Defined as reported results that persistent at this point in time.   Analgesia: 50 %            Function: Somewhat improved ROM: Somewhat improved Interpretative annotation: Ongoing benefit. No permanent benefit expected. Effective diagnostic intervention.          Interpretation: Results would suggest a successful diagnostic intervention.                  Plan:  Set up procedure as a PRN palliative treatment option for this patient.                Laboratory Chemistry  Inflammation Markers (CRP: Acute Phase) (ESR: Chronic Phase) No results found for: CRP, ESRSEDRATE, LATICACIDVEN                        Rheumatology Markers No results found for: RF, ANA, LABURIC, URICUR, LYMEIGGIGMAB, LYMEABIGMQN, HLAB27                      Renal Function Markers No results found for: BUN, CREATININE, BCR, GFRAA, GFRNONAA                           Hepatic Function Markers No results found for: AST, ALT, ALBUMIN, ALKPHOS, HCVAB, AMYLASE, LIPASE, AMMONIA                      Electrolytes No results found for: NA, K, CL, CALCIUM, MG, PHOS                      Neuropathy Markers No results found for: VITAMINB12, FOLATE, HGBA1C, HIV                      CNS Tests No results found for: COLORCSF, APPEARCSF, RBCCOUNTCSF, WBCCSF, POLYSCSF, LYMPHSCSF, EOSCSF, PROTEINCSF, GLUCCSF,  JCVIRUS, CSFOLI, IGGCSF                      Bone Pathology Markers No results found for: [place], [id number], [id number], [id number], 25OHVITD1, 25OHVITD2, 25OHVITD3, TESTOFREE, TESTOSTERONE                       Coagulation Parameters No results found for: INR, LABPROT, APTT, PLT, DDIMER, LABHEMA                      Cardiovascular Markers Lab Results  Component Value Date   HGB 14.3 12/13/2017                         CA Markers No results found for: CEA, CA125, LABCA2                      Note: Lab results reviewed.  Recent Diagnostic Imaging Results  DG C-Arm 1-60 Min-No Report Fluoroscopy was utilized by the requesting physician.  No radiographic  interpretation.   Complexity Note: Imaging results reviewed. Results shared with Ms. Chokshi, using [employer].                         Meds   Current Outpatient Medications:  .  Cholecalciferol (VITAMIN D3) 3000 units TABS, Take 1,000 Units by mouth daily., Disp: , Rfl:  .  cyclobenzaprine (FLEXERIL) 10 MG tablet, Take 10 mg by mouth 2 (two) times daily as needed for muscle spasms., Disp: , Rfl:  .  [START ON 11/21/2018] HYDROcodone-acetaminophen (NORCO) 7.5-325 MG tablet, Take 1 tablet by mouth every 8 (eight) hours as needed for moderate pain or severe pain.,  Disp: 90 tablet, Rfl: 0 .  LYRICA 150 MG capsule, 150 mg 2 (two) times daily. , Disp: , Rfl: 0 .  Multiple Vitamin (MULTIVITAMIN WITH MINERALS) TABS tablet, Take 1 tablet by mouth daily., Disp: , Rfl:  .  Omega-3 Fatty Acids (OMEGA 3 500 PO), Take by mouth daily., Disp: , Rfl:  .  pantoprazole (PROTONIX) 40 MG tablet, Take 40 mg by mouth daily., Disp: , Rfl: 0 .  sertraline (ZOLOFT) 50 MG tablet, 100 mg daily. , Disp: , Rfl: 0 .  simvastatin (ZOCOR) 40 MG tablet, 40 mg daily at 6 PM. , Disp: , Rfl: 0 .  Turmeric 500 MG CAPS, Take by mouth daily., Disp: , Rfl:   ROS  Constitutional: Denies any fever or chills Gastrointestinal: No reported hemesis, hematochezia, vomiting, or acute GI distress Musculoskeletal: Denies any acute onset joint swelling, redness, loss of ROM, or weakness Neurological: No reported episodes of acute onset apraxia, aphasia, dysarthria, agnosia, amnesia, paralysis, loss of coordination, or loss of consciousness  Allergies  Ms. Shiplett is allergic to bupropion.  [place]  Drug: Ms. Boulier  reports that she does not use drugs. Alcohol:  reports that she drinks alcohol. Tobacco:  reports that she has been smoking cigarettes. She has a 41.00 pack-year smoking history. She has never used smokeless tobacco. Medical:  has a past medical history of Anxiety, Back pain, DDD (degenerative disc disease), lumbar, Depression, GERD (gastroesophageal reflux disease), High cholesterol, Lumbar radiculitis, and Pneumothorax (1982). Surgical: Ms. Mustin  has a past surgical history that includes Abdominal hysterectomy; Foot surgery; Ectopic pregnancy surgery; Colonoscopy; and Laser ablation condolamata (N/A, 12/13/2017). Family: family history includes Breast cancer (age of onset: 71) in her mother.  Constitutional  Exam  General appearance: Well nourished, well developed, and well hydrated. In no apparent acute distress Vitals:   11/18/18 1049  BP: 107/67  Pulse: 88  Resp: 16  Temp:  98.1 F (36.7 C)  TempSrc: Oral  SpO2: 98%  Weight: 160 lb (72.6 kg)  Height: '5\' 7"'  (1.702 m)   BMI Assessment: Estimated body mass index is 25.06 kg/m as calculated from the following:   Height as of this encounter: '5\' 7"'  (1.702 m).   Weight as of this encounter: 160 lb (72.6 kg).  BMI interpretation table: BMI level Category Range association with higher incidence of chronic pain  <18 kg/m2 Underweight   18.5-24.9 kg/m2 Ideal body weight   25-29.9 kg/m2 Overweight Increased incidence by 20%  30-34.9 kg/m2 Obese (Class I) Increased incidence by 68%  35-39.9 kg/m2 Severe obesity (Class II) Increased incidence by 136%  >40 kg/m2 Extreme obesity (Class III) Increased incidence by 254%   Patient's current BMI Ideal Body weight  Body mass index is 25.06 kg/m. Ideal body weight: 61.6 kg (135 lb 12.9 oz) Adjusted ideal body weight: 66 kg (145 lb 7.7 oz)   BMI Readings from Last 4 Encounters:  11/18/18 25.06 kg/m  10/27/18 25.06 kg/m  10/02/18 25.84 kg/m  09/18/18 25.50 kg/m   Wt Readings from Last 4 Encounters:  11/18/18 160 lb (72.6 kg)  10/27/18 160 lb (72.6 kg)  10/02/18 165 lb (74.8 kg)  09/18/18 158 lb (71.7 kg)  Psych/Mental status: Alert, oriented x 3 (person, place, & time)       Eyes: PERLA Respiratory: No evidence of acute respiratory distress  Cervical Spine Area Exam  Skin & Axial Inspection: No masses, redness, edema, swelling, or associated skin lesions Alignment: Symmetrical Functional ROM: Unrestricted ROM      Stability: No instability detected Muscle Tone/Strength: Functionally intact. No obvious neuro-muscular anomalies detected. Sensory (Neurological): Unimpaired Palpation: No palpable anomalies              Upper Extremity (UE) Exam    Side: Right upper extremity  Side: Left upper extremity  Skin & Extremity Inspection: Skin color, temperature, and hair growth are WNL. No peripheral edema or cyanosis. No masses, redness, swelling, asymmetry, or  associated skin lesions. No contractures.  Skin & Extremity Inspection: Skin color, temperature, and hair growth are WNL. No peripheral edema or cyanosis. No masses, redness, swelling, asymmetry, or associated skin lesions. No contractures.  Functional ROM: Unrestricted ROM          Functional ROM: Unrestricted ROM          Muscle Tone/Strength: Functionally intact. No obvious neuro-muscular anomalies detected.  Muscle Tone/Strength: Functionally intact. No obvious neuro-muscular anomalies detected.  Sensory (Neurological): Unimpaired          Sensory (Neurological): Unimpaired          Palpation: No palpable anomalies              Palpation: No palpable anomalies              Provocative Test(s):  Phalen's test: deferred Tinel's test: deferred Apley's scratch test (touch opposite shoulder):  Action 1 (Across chest): deferred Action 2 (Overhead): deferred Action 3 (LB reach): deferred   Provocative Test(s):  Phalen's test: deferred Tinel's test: deferred Apley's scratch test (touch opposite shoulder):  Action 1 (Across chest): deferred Action 2 (Overhead): deferred Action 3 (LB reach): deferred    Thoracic Spine Area Exam  Skin & Axial Inspection: No masses, redness, or swelling Alignment: Symmetrical  Functional ROM: Unrestricted ROM Stability: No instability detected Muscle Tone/Strength: Functionally intact. No obvious neuro-muscular anomalies detected. Sensory (Neurological): Unimpaired Muscle strength & Tone: No palpable anomalies  Lumbar Spine Area Exam  Skin & Axial Inspection: No masses, redness, or swelling Alignment: Symmetrical Functional ROM: Improved after treatment       Stability: No instability detected Muscle Tone/Strength: Functionally intact. No obvious neuro-muscular anomalies detected. Sensory (Neurological): Improved Palpation: No palpable anomalies       Provocative Tests: Hyperextension/rotation test: (+) bilaterally for facet joint pain.improved after  treatment Lumbar quadrant test (Kemp's test): deferred today       Lateral bending test: deferred today       Patrick's Maneuver: deferred today                   FABER test: deferred today                   S-I anterior distraction/compression test: deferred today         S-I lateral compression test: deferred today         S-I Thigh-thrust test: deferred today         S-I Gaenslen's test: deferred today          Gait & Posture Assessment  Ambulation: Unassisted Gait: Relatively normal for age and body habitus Posture: WNL   Lower Extremity Exam    Side: Right lower extremity  Side: Left lower extremity  Stability: No instability observed          Stability: No instability observed          Skin & Extremity Inspection: Skin color, temperature, and hair growth are WNL. No peripheral edema or cyanosis. No masses, redness, swelling, asymmetry, or associated skin lesions. No contractures.  Skin & Extremity Inspection: Skin color, temperature, and hair growth are WNL. No peripheral edema or cyanosis. No masses, redness, swelling, asymmetry, or associated skin lesions. No contractures.  Functional ROM: Unrestricted ROM                  Functional ROM: Unrestricted ROM                  Muscle Tone/Strength: Functionally intact. No obvious neuro-muscular anomalies detected.  Muscle Tone/Strength: Functionally intact. No obvious neuro-muscular anomalies detected.  Sensory (Neurological): Unimpaired medial portion of foot (L4)  Sensory (Neurological): Unimpaired medial portion of foot (L4)  DTR: Patellar: deferred today Achilles: deferred today Plantar: deferred today  DTR: Patellar: deferred today Achilles: deferred today Plantar: deferred today  Palpation: No palpable anomalies  Palpation: No palpable anomalies   Assessment  Primary Diagnosis & Pertinent Problem List: The primary encounter diagnosis was Lumbar facet arthropathy. Diagnoses of Lumbar spondylosis, Lumbar degenerative disc  disease, Chronic pain syndrome, and Controlled substance agreement signed were also pertinent to this visit.  Status Diagnosis  Responding Responding Responding 1. Lumbar facet arthropathy   2. Lumbar spondylosis   3. Lumbar degenerative disc disease   4. Chronic pain syndrome   5. Controlled substance agreement signed     General Recommendations: The pain condition that the patient suffers from is best treated with a multidisciplinary approach that involves an increase in physical activity to prevent de-conditioning and worsening of the pain cycle, as well as psychological counseling (formal and/or informal) to address the co-morbid psychological affects of pain. Treatment will often involve judicious use of pain medications and interventional procedures to decrease the pain, allowing the patient to participate in the  physical activity that will ultimately produce long-lasting pain reductions. The goal of the multidisciplinary approach is to return the patient to a higher level of overall function and to restore their ability to perform activities of daily living.  57 year old female with a history of axial low back pain secondary to lumbar spondylosis who follows up status post bilateral L3, L4, L5 lumbar facet medial branch nerve blocks which provided her with benefit in regards to her pain control along with improvement in her functional status.  Patient states that the frequency of her pain episodes have decreased although the intensity of her pain spikes are still the same when they do occur.  Overall she is also endorsed improvement in her range of motion.  She is still endorsing ongoing benefits.  We discussed repeating this procedure as needed.  Patient agreement with plan.  Patient has weaned and discontinued her Klonopin as recommended.  She states that this was difficult but feels that she is more cognitively alert by being off of this medication.  I will obtain a repeat urine drug screen  today.  This should be negative for Klonopin as her last dose was approximately 12 days ago.  I will prescribe her her previous dose of Norco 7.5 mg 3 times daily as needed, quantity 90/month.  Patient will also complete an opioid contract with our clinic.  Plan: -Sign opioid contract -Prescription for hydrocodone as below -PRN bilateral L3, L4, L5 facet medial branch nerve block #2 -Continue Flexeril, Lyrica, Zoloft as prescribed.   Plan of Care  Pharmacotherapy (Medications Ordered): Meds ordered this encounter  Medications  . HYDROcodone-acetaminophen (NORCO) 7.5-325 MG tablet    Sig: Take 1 tablet by mouth every 8 (eight) hours as needed for moderate pain or severe pain.    Dispense:  90 tablet    Refill:  0   Lab-work, procedure(s), and/or referral(s): Orders Placed This Encounter  Procedures  . LUMBAR FACET(MEDIAL BRANCH NERVE BLOCK) MBNB  . ToxASSURE Select 13 (MW), Urine    Pharmacological management options:  Opioid Analgesics: We'll take over management today. See above orders Membrane stabilizer: Currently on an appropriate regimen Muscle relaxant: Currently on an appropriate regimen NSAID: We have discussed the possibility of a trial Other analgesic(s): To be determined at a later time    PRN Procedures:   Lumbar facet medial branch nerve blocks bilaterally #2   Provider-requested follow-up: Return in about 4 weeks (around 12/16/2018) for Medication Management.  Time Note: Greater than 50% of the 25 minute(s) of face-to-face time spent with Ms. Fassler, was spent in counseling/coordination of care regarding: Ms. Brick primary cause of pain, the treatment plan, treatment alternatives, the risks and possible complications of proposed treatment, medication side effects, going over the informed consent, the opioid analgesic risks and possible complications, the results, interpretation and significance of  her recent diagnostic interventional treatment(s), the  appropriate use of her medications, realistic expectations, the goals of pain management (increased in functionality), the medication agreement and the patient's responsibilities when it comes to controlled substances.  Future Appointments  Date Time Provider [place]  12/16/2018  9:45 AM Gillis Santa, MD [facility] None    Primary Care Physician: Adin Hector, MD Location: [facility] Outpatient Pain Management Facility Note by: Gillis Santa, M.D Date: 11/18/2018; Time: 12:05 PM  Patient Instructions  You have been given a script for Hydrocodone today. You provided [place] with a urine specimen.

## 2018-11-18 NOTE — Patient Instructions (Signed)
You have been given a script for Hydrocodone today. You provided us with a urine specimen.

## 2018-11-18 NOTE — Progress Notes (Signed)
Nursing Pain Medication Assessment:  Safety precautions to be maintained throughout the outpatient stay will include: orient to surroundings, keep bed in low position, maintain call bell within reach at all times, provide assistance with transfer out of bed and ambulation.  Medication Inspection Compliance: Pill count conducted under aseptic conditions, in front of the patient. Neither the pills nor the bottle was removed from the patient's sight at any time. Once count was completed pills were immediately returned to the patient in their original bottle.  Medication: Hydrocodone/APAP Pill/Patch Count: 13.5 of 90 pills remain Pill/Patch Appearance: Markings consistent with prescribed medication Bottle Appearance: Standard pharmacy container. Clearly labeled. Filled Date: 10 / 16 / 2019 Last Medication intake:  Today

## 2018-11-21 LAB — TOXASSURE SELECT 13 (MW), URINE

## 2018-12-16 ENCOUNTER — Ambulatory Visit
Payer: BC Managed Care – PPO | Attending: Student in an Organized Health Care Education/Training Program | Admitting: Student in an Organized Health Care Education/Training Program

## 2018-12-16 ENCOUNTER — Encounter: Payer: Self-pay | Admitting: Student in an Organized Health Care Education/Training Program

## 2018-12-16 ENCOUNTER — Other Ambulatory Visit: Payer: Self-pay

## 2018-12-16 VITALS — BP 111/78 | HR 87 | Temp 98.8°F | Resp 18 | Ht 67.0 in | Wt 165.0 lb

## 2018-12-16 DIAGNOSIS — Z79899 Other long term (current) drug therapy: Secondary | ICD-10-CM | POA: Insufficient documentation

## 2018-12-16 DIAGNOSIS — F419 Anxiety disorder, unspecified: Secondary | ICD-10-CM | POA: Diagnosis not present

## 2018-12-16 DIAGNOSIS — Z5181 Encounter for therapeutic drug level monitoring: Secondary | ICD-10-CM | POA: Insufficient documentation

## 2018-12-16 DIAGNOSIS — M5441 Lumbago with sciatica, right side: Secondary | ICD-10-CM | POA: Insufficient documentation

## 2018-12-16 DIAGNOSIS — M5136 Other intervertebral disc degeneration, lumbar region: Secondary | ICD-10-CM | POA: Diagnosis not present

## 2018-12-16 DIAGNOSIS — Z9889 Other specified postprocedural states: Secondary | ICD-10-CM | POA: Insufficient documentation

## 2018-12-16 DIAGNOSIS — E7800 Pure hypercholesterolemia, unspecified: Secondary | ICD-10-CM | POA: Diagnosis not present

## 2018-12-16 DIAGNOSIS — K219 Gastro-esophageal reflux disease without esophagitis: Secondary | ICD-10-CM | POA: Insufficient documentation

## 2018-12-16 DIAGNOSIS — G894 Chronic pain syndrome: Secondary | ICD-10-CM | POA: Diagnosis not present

## 2018-12-16 DIAGNOSIS — Z90710 Acquired absence of both cervix and uterus: Secondary | ICD-10-CM | POA: Diagnosis not present

## 2018-12-16 DIAGNOSIS — F329 Major depressive disorder, single episode, unspecified: Secondary | ICD-10-CM | POA: Insufficient documentation

## 2018-12-16 DIAGNOSIS — F17210 Nicotine dependence, cigarettes, uncomplicated: Secondary | ICD-10-CM | POA: Insufficient documentation

## 2018-12-16 DIAGNOSIS — M47816 Spondylosis without myelopathy or radiculopathy, lumbar region: Secondary | ICD-10-CM

## 2018-12-16 MED ORDER — HYDROCODONE-ACETAMINOPHEN 10-325 MG PO TABS: 1.0000 | ORAL_TABLET | Freq: Three times a day (TID) | ORAL | 0 refills | Status: DC | PRN

## 2018-12-16 NOTE — Progress Notes (Signed)
Patient's Name: Vanessa Greer  MRN: [id number]  Referring Provider: Adin Hector, MD  DOB: Jul 24, 1961  PCP: Adin Hector, MD  DOS: 12/16/2018  Note by: Gillis Santa, MD  Service setting: Ambulatory outpatient  Specialty: Interventional Pain Management  Location: [facility] (AMB) Pain Management Facility    Patient type: Established   Primary Reason(s) for Visit: Encounter for prescription drug management. (Level of risk: moderate)  CC: Medication Refill (Hydrocodone/Acetaminophen )  HPI  Vanessa Greer is a 57 y.o. year old, female patient, who comes today for a medication management evaluation. 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 Her primarily concern today is the Medication Refill (Hydrocodone/Acetaminophen )  Pain Assessment: Location: Lower, Left Back Radiating: Radiates from left lower back to buttocks area  Onset: More than a month ago Duration: Chronic pain Quality: Constant, Aching, Burning Severity: 5 /10 (subjective, self-reported pain score)  Note: Reported level is compatible with observation.                         When using our objective Pain Scale, levels between 6 and 10/10 are said to belong in an emergency room, as it progressively worsens from a 6/10, described as severely limiting, requiring emergency care not usually available at an outpatient pain management facility. At a 6/10 level, communication becomes difficult and requires great effort. Assistance to reach the emergency department may be required. Facial flushing and profuse sweating along with potentially dangerous increases in heart rate and blood pressure will be evident. Effect on ADL: "Have to sit while I work,  I'm just miserable"  Timing: Constant Modifying factors: Hydrocodone only helps a little bit, hot shower  BP: 111/78  HR: 87  Vanessa Greer was last scheduled for an appointment on 11/18/2018 for medication management. During today's appointment we reviewed Vanessa Greer's chronic pain status, as well as her outpatient medication regimen.  Increase LBP, L>R. States that [facility] only lasts about 2 hours. Interested in repeating facet blocks and requesting to increase HC.  The patient  reports no history of drug use. Her body mass index is 25.84 kg/m.  Further details on both, my assessment(s), as well as the proposed treatment plan, please see below.  Controlled Substance Pharmacotherapy Assessment REMS (Risk Evaluation and Mitigation Strategy)  Analgesic: Hydrocodone 7.5 mg 3 times daily as needed, quantity 90/month MME/day: 22.5 mg/day.  Vanessa Napoleon, RN  12/16/2018  9:58 AM  Sign when Signing Visit Nursing Pain Medication Assessment:  Safety precautions to be maintained throughout the outpatient stay will include: orient to surroundings, keep bed in low position, maintain call bell within reach at all times, provide assistance with transfer out of bed and ambulation.  Medication Inspection Compliance: Pill count conducted under aseptic conditions, in front of the patient. Neither the pills nor the bottle was removed from the patient's sight at any time. Once count was completed pills were immediately returned to the patient in their original bottle.  Medication: Hydrocodone/APAP Pill/Patch Count: 25.5 of 90 pills remain Pill/Patch Appearance: Markings consistent with prescribed medication Bottle Appearance: Standard pharmacy container. Clearly labeled. Filled Date: 27 / 22 / 2019 Last Medication intake:  Today Pharmacokinetics: Liberation and absorption (onset of action): WNL Distribution (time to peak effect): WNL  Metabolism and excretion (duration of action): WNL          Pharmacodynamics: Desired effects: Analgesia: Vanessa Greer reports 50% benefit. Functional ability: Patient reports that medication allows her to accomplish basic ADLs Clinically meaningful improvement in function (CMIF): Sustained CMIF goals met Perceived effectiveness: Described as relatively effective but with some room for improvement Undesirable effects: Side-effects or Adverse reactions: None reported Monitoring: [place] PMP: Online review of the past 30-monthperiod conducted. Compliant with practice rules and regulations Last UDS on record: Summary  Date Value Ref Range Status  11/18/2018 FINAL  Final    Comment:    ==================================================================== TOXASSURE SELECT 13 (MW) ==================================================================== Test                             Result       Flag       Units Drug Present   Hydrocodone                    480                     ng/mg creat   Hydromorphone                  213                     ng/mg creat   Norhydrocodone                 2570                    ng/mg creat    Sources of hydrocodone include scheduled prescription    medications. Hydromorphone and norhydrocodone are expected    metabolites of hydrocodone. Hydromorphone is also available as a    scheduled prescription medication. ==================================================================== Test                      Result    Flag   Units      Ref Range   Creatinine              30               mg/dL      >=20 ==================================================================== Declared Medications:  Medication list was not provided. ==================================================================== For clinical consultation, please call ([phone] ====================================================================    UDS interpretation: Compliant          Medication Assessment Form: Reviewed. Patient indicates  being compliant with therapy Treatment compliance: Compliant Risk Assessment Profile: Aberrant behavior: See prior evaluations. None observed or detected today Comorbid factors increasing risk of overdose: See prior notes. No additional risks detected today Opioid risk tool (ORT) (Total Score): 1 Personal History of Substance Abuse (SUD-Substance use disorder):  Alcohol: Negative  Illegal Drugs: Negative  Rx Drugs: Negative  ORT Risk Level calculation: Low Risk Risk of substance use disorder (SUD): Low Opioid Risk Tool - 12/16/18 0956      Family History of Substance Abuse   Alcohol  Positive Female    Illegal Drugs  Negative    Rx Drugs  Negative      Personal History of Substance Abuse   Alcohol  Negative    Illegal Drugs  Negative    Rx Drugs  Negative      Age   Age between 166-45years   No      History  of Preadolescent Sexual Abuse   History of Preadolescent Sexual Abuse  Negative or Female      Psychological Disease   Psychological Disease  Negative    Depression  Negative      Total Score   Opioid Risk Tool Scoring  1    Opioid Risk Interpretation  Low Risk      ORT Scoring interpretation table:  Score <3 = Low Risk for SUD  Score between 4-7 = Moderate Risk for SUD  Score >8 = High Risk for Opioid Abuse   Risk Mitigation Strategies:  Patient Counseling: Covered Patient-Prescriber Agreement (PPA): Present and active  Notification to other healthcare providers: Done  Pharmacologic Plan: Given worsening low back pain secondary to facet arthropathy, will increase hydrocodone to 10 mg 3 times daily as needed.  No further dose escalations beyond this.  We will plan for repeat bilateral lumbar facets.  This provided the patient significant relief previously.  We will wean hydrocodone back to 7.5 mg after.             Laboratory Chemistry  Inflammation Markers (CRP: Acute Phase) (ESR: Chronic Phase) No results found for: CRP, ESRSEDRATE, LATICACIDVEN                        Rheumatology Markers No results found for: RF, ANA, LABURIC, URICUR, LYMEIGGIGMAB, LYMEABIGMQN, HLAB27                      Renal Function Markers No results found for: BUN, CREATININE, BCR, GFRAA, GFRNONAA, LABVMA, EPIRU, [id number], NOREPRU, NOREPI24HUR, DOPARU, [id number]                           Hepatic Function Markers No results found for: AST, ALT, ALBUMIN, ALKPHOS, HCVAB, AMYLASE, LIPASE, AMMONIA                      Electrolytes No results found for: NA, K, CL, CALCIUM, MG, PHOS                      Neuropathy Markers No results found for: VITAMINB12, FOLATE, HGBA1C, HIV                      CNS Tests No results found for: COLORCSF, APPEARCSF, RBCCOUNTCSF, WBCCSF, POLYSCSF, LYMPHSCSF, EOSCSF, PROTEINCSF, GLUCCSF, JCVIRUS, CSFOLI, IGGCSF                      Bone Pathology Markers No results found for: VD25OH, [id number], [id number], [id number], 25OHVITD1, 25OHVITD2, 25OHVITD3, TESTOFREE, TESTOSTERONE                       Coagulation Parameters No results found for: INR, LABPROT, APTT, PLT, DDIMER, LABHEMA, VITAMINK1                      Cardiovascular Markers Lab Results  Component Value Date   HGB 14.3 12/13/2017                         CA Markers No results found for: CEA, CA125, LABCA2                      Note: Lab results reviewed.  Recent Diagnostic Imaging Results  DG C-Arm 1-60 Min-No Report Fluoroscopy was utilized by  the requesting physician.  No radiographic  interpretation.   Complexity Note: Imaging results reviewed. Results shared with Ms. Porto, using [employer].                         Meds   Current Outpatient Medications:  .  Cholecalciferol (VITAMIN D3) 3000 units TABS, Take 1,000 Units by mouth daily., Disp: , Rfl:  .  cyclobenzaprine (FLEXERIL) 10 MG tablet, Take 10 mg by mouth 2 (two) times daily as needed for muscle spasms., Disp: , Rfl:  .  LYRICA 150 MG capsule, 150 mg 2 (two) times daily. , Disp: , Rfl: 0 .   Multiple Vitamin (MULTIVITAMIN WITH MINERALS) TABS tablet, Take 1 tablet by mouth daily., Disp: , Rfl:  .  Omega-3 Fatty Acids (OMEGA 3 500 PO), Take by mouth daily., Disp: , Rfl:  .  pantoprazole (PROTONIX) 40 MG tablet, Take 40 mg by mouth daily., Disp: , Rfl: 0 .  sertraline (ZOLOFT) 50 MG tablet, 100 mg daily. , Disp: , Rfl: 0 .  simvastatin (ZOCOR) 40 MG tablet, 40 mg daily at 6 PM. , Disp: , Rfl: 0 .  Turmeric 500 MG CAPS, Take by mouth daily., Disp: , Rfl:  .  [START ON 12/20/2018] HYDROcodone-acetaminophen (NORCO) 10-325 MG tablet, Take 1 tablet by mouth every 8 (eight) hours as needed for severe pain., Disp: 90 tablet, Rfl: 0 .  [START ON 01/19/2019] HYDROcodone-acetaminophen (NORCO) 10-325 MG tablet, Take 1 tablet by mouth every 8 (eight) hours as needed for severe pain., Disp: 90 tablet, Rfl: 0  ROS  Constitutional: Denies any fever or chills Gastrointestinal: No reported hemesis, hematochezia, vomiting, or acute GI distress Musculoskeletal: Denies any acute onset joint swelling, redness, loss of ROM, or weakness Neurological: No reported episodes of acute onset apraxia, aphasia, dysarthria, agnosia, amnesia, paralysis, loss of coordination, or loss of consciousness  Allergies  Ms. Beever is allergic to bupropion.  [place]  Drug: Ms. Roehrich  reports no history of drug use. Alcohol:  reports current alcohol use. Tobacco:  reports that she has been smoking cigarettes. She has a 41.00 pack-year smoking history. She has never used smokeless tobacco. Medical:  has a past medical history of Anxiety, Back pain, DDD (degenerative disc disease), lumbar, Depression, GERD (gastroesophageal reflux disease), High cholesterol, Lumbar radiculitis, and Pneumothorax (1982). Surgical: Ms. Sanagustin  has a past surgical history that includes Abdominal hysterectomy; Foot surgery; Ectopic pregnancy surgery; Colonoscopy; and Laser ablation condolamata (N/A, 12/13/2017). Family: family history includes  Breast cancer (age of onset: 22) in her mother.  Constitutional Exam  General appearance: Well nourished, well developed, and well hydrated. In no apparent acute distress Vitals:   12/16/18 0950  BP: 111/78  Pulse: 87  Resp: 18  Temp: 98.8 F (37.1 C)  SpO2: 100%  Weight: 165 lb (74.8 kg)  Height: _0  (1.702 m)   BMI Assessment: Estimated body mass index is 25.84 kg/m as calculated from the following:   Height as of this encounter: _1  (1.702 m).   Weight as of this encounter: 165 lb (74.8 kg).  BMI interpretation table: BMI level Category Range association with higher incidence of chronic pain  <18 kg/m2 Underweight   18.5-24.9 kg/m2 Ideal body weight   25-29.9 kg/m2 Overweight Increased incidence by 20%  30-34.9 kg/m2 Obese (Class I) Increased incidence by 68%  35-39.9 kg/m2 Severe obesity (Class II) Increased incidence by 136%  >40 kg/m2 Extreme obesity (Class III) Increased incidence by 254%  Patient's current BMI Ideal Body weight  Body mass index is 25.84 kg/m. Ideal body weight: 61.6 kg (135 lb 12.9 oz) Adjusted ideal body weight: 66.9 kg (147 lb 7.7 oz)   BMI Readings from Last 4 Encounters:  12/16/18 25.84 kg/m  11/18/18 25.06 kg/m  10/27/18 25.06 kg/m  10/02/18 25.84 kg/m   Wt Readings from Last 4 Encounters:  12/16/18 165 lb (74.8 kg)  11/18/18 160 lb (72.6 kg)  10/27/18 160 lb (72.6 kg)  10/02/18 165 lb (74.8 kg)  Psych/Mental status: Alert, oriented x 3 (person, place, & time)       Eyes: PERLA Respiratory: No evidence of acute respiratory distress  Cervical Spine Area Exam  Skin & Axial Inspection: No masses, redness, edema, swelling, or associated skin lesions Alignment: Symmetrical Functional ROM: Unrestricted ROM      Stability: No instability detected Muscle Tone/Strength: Functionally intact. No obvious neuro-muscular anomalies detected. Sensory (Neurological): Unimpaired Palpation: No palpable anomalies              Upper  Extremity (UE) Exam    Side: Right upper extremity  Side: Left upper extremity  Skin & Extremity Inspection: Skin color, temperature, and hair growth are WNL. No peripheral edema or cyanosis. No masses, redness, swelling, asymmetry, or associated skin lesions. No contractures.  Skin & Extremity Inspection: Skin color, temperature, and hair growth are WNL. No peripheral edema or cyanosis. No masses, redness, swelling, asymmetry, or associated skin lesions. No contractures.  Functional ROM: Unrestricted ROM          Functional ROM: Unrestricted ROM          Muscle Tone/Strength: Functionally intact. No obvious neuro-muscular anomalies detected.  Muscle Tone/Strength: Functionally intact. No obvious neuro-muscular anomalies detected.  Sensory (Neurological): Unimpaired          Sensory (Neurological): Unimpaired          Palpation: No palpable anomalies              Palpation: No palpable anomalies              Provocative Test(s):  Phalen's test: deferred Tinel's test: deferred Apley's scratch test (touch opposite shoulder):  Action 1 (Across chest): deferred Action 2 (Overhead): deferred Action 3 (LB reach): deferred   Provocative Test(s):  Phalen's test: deferred Tinel's test: deferred Apley's scratch test (touch opposite shoulder):  Action 1 (Across chest): deferred Action 2 (Overhead): deferred Action 3 (LB reach): deferred    Thoracic Spine Area Exam  Skin & Axial Inspection: No masses, redness, or swelling Alignment: Symmetrical Functional ROM: Unrestricted ROM Stability: No instability detected Muscle Tone/Strength: Functionally intact. No obvious neuro-muscular anomalies detected. Sensory (Neurological): Unimpaired Muscle strength & Tone: No palpable anomalies  Lumbar Spine Area Exam  Skin & Axial Inspection: No masses, redness, or swelling Alignment: Symmetrical Functional ROM: Decreased ROM affecting both sides Stability: No instability detected Muscle Tone/Strength:  Functionally intact. No obvious neuro-muscular anomalies detected. Sensory (Neurological): Musculoskeletal pain pattern Palpation: Complains of area being tender to palpation       Provocative Tests: Hyperextension/rotation test: (+) bilaterally for facet joint pain. Lumbar quadrant test (Kemp's test): (+) bilaterally for facet joint pain. Lateral bending test: (+) due to pain. Patrick's Maneuver: deferred today                   FABER* test: deferred today                   S-I anterior distraction/compression test: deferred today  S-I lateral compression test: deferred today         S-I Thigh-thrust test: deferred today         S-I Gaenslen's test: deferred today         *(Flexion, ABduction and External Rotation)  Gait & Posture Assessment  Ambulation: Unassisted Gait: Relatively normal for age and body habitus Posture: WNL   Lower Extremity Exam    Side: Right lower extremity  Side: Left lower extremity  Stability: No instability observed          Stability: No instability observed          Skin & Extremity Inspection: Skin color, temperature, and hair growth are WNL. No peripheral edema or cyanosis. No masses, redness, swelling, asymmetry, or associated skin lesions. No contractures.  Skin & Extremity Inspection: Skin color, temperature, and hair growth are WNL. No peripheral edema or cyanosis. No masses, redness, swelling, asymmetry, or associated skin lesions. No contractures.  Functional ROM: Unrestricted ROM                  Functional ROM: Unrestricted ROM                  Muscle Tone/Strength: Functionally intact. No obvious neuro-muscular anomalies detected.  Muscle Tone/Strength: Functionally intact. No obvious neuro-muscular anomalies detected.  Sensory (Neurological): Unimpaired        Sensory (Neurological): Unimpaired        DTR: Patellar: deferred today Achilles: deferred today Plantar: deferred today  DTR: Patellar: deferred today Achilles: deferred  today Plantar: deferred today  Palpation: No palpable anomalies  Palpation: No palpable anomalies   Assessment  Primary Diagnosis & Pertinent Problem List: The primary encounter diagnosis was Lumbar facet arthropathy. Diagnoses of Lumbar spondylosis, Lumbar degenerative disc disease, and Chronic pain syndrome were also pertinent to this visit.  Status Diagnosis  Having a Flare-up Having a Flare-up Persistent 1. Lumbar facet arthropathy   2. Lumbar spondylosis   3. Lumbar degenerative disc disease   4. Chronic pain syndrome       Patient is having return of her axial low back pain similar in quality to pre-lumbar facet medial branch nerve block.  Patient does have pain with facet loading on lumbar extension.  We discussed repeating lumbar facet medial branch nerve block #2 as a freshman provided her with approximately 90% pain relief for over 3 weeks.  We also briefly discussed lumbar radiofrequency ablation.  Given that the patient is having worsening pain, we will temporarily increase her hydrocodone to 10 mg 3 times daily as needed from 7.5 mg 3 times daily as needed.  Patient states that she is having dose failure at our 3 or 4 after intake.  We will likely wean back down to 7.5 mg 3 times daily as needed after lumbar facet blocks and RFA.  Plan: -Temporary increase in hydrocodone from 7.5 mg 3 times daily as needed to 10 mg 3 times daily as needed, quantity 90/month -Continue with Lyrica 150 mg twice daily, turmeric.  -Plan for bilateral lumbar facet medial branch nerve blocks #2 at L3, L4, L5, S1 for lumbar facet arthropathy, lumbar spondylosis -UDS today  Plan of Care  Pharmacotherapy (Medications Ordered): Meds ordered this encounter  Medications  . HYDROcodone-acetaminophen (NORCO) 10-325 MG tablet    Sig: Take 1 tablet by mouth every 8 (eight) hours as needed for severe pain.    Dispense:  90 tablet    Refill:  0    Do not place  this medication, or any other prescription  from our practice, on "Automatic Refill". Patient may have prescription filled one day early if pharmacy is closed on scheduled refill date.  Marland Kitchen HYDROcodone-acetaminophen (NORCO) 10-325 MG tablet    Sig: Take 1 tablet by mouth every 8 (eight) hours as needed for severe pain.    Dispense:  90 tablet    Refill:  0    Do not place this medication, or any other prescription from our practice, on "Automatic Refill". Patient may have prescription filled one day early if pharmacy is closed on scheduled refill date.   Lab-work, procedure(s), and/or referral(s): Orders Placed This Encounter  Procedures  . LUMBAR FACET(MEDIAL BRANCH NERVE BLOCK) MBNB  . ToxASSURE Select 13 (MW), Urine    Considering:   Lumbar facet medial branch nerve blocks #2 at L3, L4, L5, S1 Lumbar radiofrequency ablation at the above-mentioned levels.   Provider-requested follow-up: Return in about 4 weeks (around 01/13/2019) for Procedure.  Time Note: Greater than 50% of the 25 minute(s) of face-to-face time spent with Ms. Kawa, was spent in counseling/coordination of care regarding: Ms. Das primary cause of pain, the treatment plan, treatment alternatives, the risks and possible complications of proposed treatment, medication side effects, going over the informed consent, the opioid analgesic risks and possible complications, the results, interpretation and significance of  her recent diagnostic interventional treatment(s), the appropriate use of her medications, realistic expectations, the goals of pain management (increased in functionality), the medication agreement and the patient's responsibilities when it comes to controlled substances.  Future Appointments  Date Time Provider [place]  01/14/2019  8:45 AM Gillis Santa, MD [facility] None  02/10/2019  8:30 AM Gillis Santa, MD [facility] None    Primary Care Physician: Adin Hector, MD Location: [facility] Outpatient [facility] Management Facility Note by:  Gillis Santa, M.D Date: 12/16/2018; Time: 11:27 AM  Patient Instructions  Preparing for Procedure with Sedation Instructions: . Oral Intake: Do not eat or drink anything for at least 8 hours prior to your procedure. . Transportation: Public transportation is not allowed. Bring an adult driver. The driver must be physically present in our waiting room before any procedure can be started. Marland Kitchen Physical Assistance: Bring an adult capable of physically assisting you, in the event you need help. . Blood Pressure Medicine: Take your blood pressure medicine with a sip of water the morning of the procedure. . Insulin: Take only  of your normal insulin dose. . Preventing infections: Shower with an antibacterial soap the morning of your procedure. . Build-up your immune system: Take 1000 mg of Vitamin C with every meal (3 times a day) the day prior to your procedure. . Pregnancy: If you are pregnant, call and cancel the procedure. . Sickness: If you have a cold, fever, or any active infections, call and cancel the procedure. . Arrival: You must be in the facility at least 30 minutes prior to your scheduled procedure. . Children: Do not bring children with you. . Dress appropriately: Bring dark clothing that you would not mind if they get stained. . Valuables: Do not bring any jewelry or valuables. Procedure appointments are reserved for interventional treatments only. Marland Kitchen No Prescription Refills. . No medication changes will be discussed during procedure appointments. No disability issues will be discussed.Facet Blocks Patient Information  Description: The facets are joints in the spine between the vertebrae.  Like any joints in the body, facets can become irritated and painful.  Arthritis can also effect the facets.  By injecting steroids and local anesthetic in and around these joints, we can temporarily block the nerve supply to them.  Steroids act directly on irritated nerves and tissues to reduce  selling and inflammation which often leads to decreased pain.  Facet blocks may be done anywhere along the spine from the neck to the low back depending upon the location of your pain.   After numbing the skin with local anesthetic (like Novocaine), a small needle is passed onto the facet joints under x-ray guidance.  You may experience a sensation of pressure while this is being done.  The entire block usually lasts about 15-25 minutes.   Conditions which may be treated by facet blocks:  Low back/buttock pain Neck/shoulder pain Certain types of headaches  Preparation for the injection:  Do not eat any solid food or dairy products within 8 hours of your appointment. You may drink clear liquid up to 3 hours before appointment.  Clear liquids include water, black coffee, juice or soda.  No milk or cream please. You may take your regular medication, including pain medications, with a sip of water before your appointment.  Diabetics should hold regular insulin (if taken separately) and take 1/2 normal NPH dose the morning of the procedure.  Carry some sugar containing items with you to your appointment. A driver must accompany you and be prepared to drive you home after your procedure. Bring all your current medications with you. An IV may be inserted and sedation may be given at the discretion of the physician. A blood pressure cuff, EKG and other monitors will often be applied during the procedure.  Some patients may need to have extra oxygen administered for a short period. You will be asked to provide medical information, including your allergies and medications, prior to the procedure.  We must know immediately if you are taking blood thinners (like Coumadin/Warfarin) or if you are allergic to IV iodine contrast (dye).  We must know if you could possible be pregnant.  Possible side-effects:  Bleeding from needle site Infection (rare, may require surgery) Nerve injury (rare) Numbness &  tingling (temporary) Difficulty urinating (rare, temporary) Spinal headache (a headache worse with upright posture) Light-headedness (temporary) Pain at injection site (serveral days) Decreased blood pressure (rare, temporary) Weakness in arm/leg (temporary) Pressure sensation in back/neck (temporary)   Call if you experience:  Fever/chills associated with headache or increased back/neck pain Headache worsened by an upright position New onset, weakness or numbness of an extremity below the injection site Hives or difficulty breathing (go to the emergency room) Inflammation or drainage at the injection site(s) Severe back/neck pain greater than usual New symptoms which are concerning to you  Please note:  Although the local anesthetic injected can often make your back or neck feel good for several hours after the injection, the pain will likely return. It takes 3-7 days for steroids to work.  You may not notice any pain relief for at least one week.  If effective, we will often do a series of 2-3 injections spaced 3-6 weeks apart to maximally decrease your pain.  After the initial series, you may be a candidate for a more permanent nerve block of the facets.  If you have any questions, please call [phone] . [facility]

## 2018-12-16 NOTE — Patient Instructions (Signed)
Preparing for Procedure with Sedation Instructions: . Oral Intake: Do not eat or drink anything for at least 8 hours prior to your procedure. . Transportation: Public transportation is not allowed. Bring an adult driver. The driver must be physically present in our waiting room before any procedure can be started. . Physical Assistance: Bring an adult capable of physically assisting you, in the event you need help. . Blood Pressure Medicine: Take your blood pressure medicine with a sip of water the morning of the procedure. . Insulin: Take only  of your normal insulin dose. . Preventing infections: Shower with an antibacterial soap the morning of your procedure. . Build-up your immune system: Take 1000 mg of Vitamin C with every meal (3 times a day) the day prior to your procedure. . Pregnancy: If you are pregnant, call and cancel the procedure. . Sickness: If you have a cold, fever, or any active infections, call and cancel the procedure. . Arrival: You must be in the facility at least 30 minutes prior to your scheduled procedure. . Children: Do not bring children with you. . Dress appropriately: Bring dark clothing that you would not mind if they get stained. . Valuables: Do not bring any jewelry or valuables. Procedure appointments are reserved for interventional treatments only. . No Prescription Refills. . No medication changes will be discussed during procedure appointments. No disability issues will be discussed.Facet Blocks Patient Information  Description: The facets are joints in the spine between the vertebrae.  Like any joints in the body, facets can become irritated and painful.  Arthritis can also effect the facets.  By injecting steroids and local anesthetic in and around these joints, we can temporarily block the nerve supply to them.  Steroids act directly on irritated nerves and tissues to reduce selling and inflammation which often leads to decreased pain.  Facet blocks may  be done anywhere along the spine from the neck to the low back depending upon the location of your pain.   After numbing the skin with local anesthetic (like Novocaine), a small needle is passed onto the facet joints under x-ray guidance.  You may experience a sensation of pressure while this is being done.  The entire block usually lasts about 15-25 minutes.   Conditions which may be treated by facet blocks:  Low back/buttock pain Neck/shoulder pain Certain types of headaches  Preparation for the injection:  Do not eat any solid food or dairy products within 8 hours of your appointment. You may drink clear liquid up to 3 hours before appointment.  Clear liquids include water, black coffee, juice or soda.  No milk or cream please. You may take your regular medication, including pain medications, with a sip of water before your appointment.  Diabetics should hold regular insulin (if taken separately) and take 1/2 normal NPH dose the morning of the procedure.  Carry some sugar containing items with you to your appointment. A driver must accompany you and be prepared to drive you home after your procedure. Bring all your current medications with you. An IV may be inserted and sedation may be given at the discretion of the physician. A blood pressure cuff, EKG and other monitors will often be applied during the procedure.  Some patients may need to have extra oxygen administered for a short period. You will be asked to provide medical information, including your allergies and medications, prior to the procedure.  We must know immediately if you are taking blood thinners (like Coumadin/Warfarin) or if   you are allergic to IV iodine contrast (dye).  We must know if you could possible be pregnant.  Possible side-effects:  Bleeding from needle site Infection (rare, may require surgery) Nerve injury (rare) Numbness & tingling (temporary) Difficulty urinating (rare, temporary) Spinal headache (a  headache worse with upright posture) Light-headedness (temporary) Pain at injection site (serveral days) Decreased blood pressure (rare, temporary) Weakness in arm/leg (temporary) Pressure sensation in back/neck (temporary)   Call if you experience:  Fever/chills associated with headache or increased back/neck pain Headache worsened by an upright position New onset, weakness or numbness of an extremity below the injection site Hives or difficulty breathing (go to the emergency room) Inflammation or drainage at the injection site(s) Severe back/neck pain greater than usual New symptoms which are concerning to you  Please note:  Although the local anesthetic injected can often make your back or neck feel good for several hours after the injection, the pain will likely return. It takes 3-7 days for steroids to work.  You may not notice any pain relief for at least one week.  If effective, we will often do a series of 2-3 injections spaced 3-6 weeks apart to maximally decrease your pain.  After the initial series, you may be a candidate for a more permanent nerve block of the facets.  If you have any questions, please call [phone] . [facility] 

## 2018-12-16 NOTE — Progress Notes (Signed)
Nursing Pain Medication Assessment:  Safety precautions to be maintained throughout the outpatient stay will include: orient to surroundings, keep bed in low position, maintain call bell within reach at all times, provide assistance with transfer out of bed and ambulation.  Medication Inspection Compliance: Pill count conducted under aseptic conditions, in front of the patient. Neither the pills nor the bottle was removed from the patient's sight at any time. Once count was completed pills were immediately returned to the patient in their original bottle.  Medication: Hydrocodone/APAP Pill/Patch Count: 25.5 of 90 pills remain Pill/Patch Appearance: Markings consistent with prescribed medication Bottle Appearance: Standard pharmacy container. Clearly labeled. Filled Date: 8211 / 22 / 2019 Last Medication intake:  Today

## 2018-12-19 LAB — TOXASSURE SELECT 13 (MW), URINE

## 2019-01-14 ENCOUNTER — Ambulatory Visit (HOSPITAL_BASED_OUTPATIENT_CLINIC_OR_DEPARTMENT_OTHER): Payer: BC Managed Care – PPO | Admitting: Student in an Organized Health Care Education/Training Program

## 2019-01-14 ENCOUNTER — Encounter: Payer: Self-pay | Admitting: Student in an Organized Health Care Education/Training Program

## 2019-01-14 ENCOUNTER — Ambulatory Visit
Admission: RE | Admit: 2019-01-14 | Discharge: 2019-01-14 | Disposition: A | Discharge status: Home / Self Care | Payer: BC Managed Care – PPO | Source: Ambulatory Visit | Attending: Student in an Organized Health Care Education/Training Program | Admitting: Student in an Organized Health Care Education/Training Program

## 2019-01-14 VITALS — BP 124/60 | HR 73 | Temp 98.4°F | Resp 14 | Ht 67.0 in | Wt 165.0 lb

## 2019-01-14 DIAGNOSIS — M47816 Spondylosis without myelopathy or radiculopathy, lumbar region: Secondary | ICD-10-CM | POA: Insufficient documentation

## 2019-01-14 MED ORDER — DEXAMETHASONE SODIUM PHOSPHATE 10 MG/ML IJ SOLN
10.0000 mg | Freq: Once | INTRAMUSCULAR | Status: AC
  Administered 2019-01-14: 10 mg
  Filled 2019-01-14: qty 1

## 2019-01-14 MED ORDER — LIDOCAINE HCL 2 % IJ SOLN
20.0000 mL | Freq: Once | INTRAMUSCULAR | Status: AC
  Administered 2019-01-14: 400 mg
  Filled 2019-01-14: qty 20

## 2019-01-14 MED ORDER — LACTATED RINGERS IV SOLN
1000.0000 mL | Freq: Once | INTRAVENOUS | Status: AC
  Administered 2019-01-14: 1000 mL via INTRAVENOUS

## 2019-01-14 MED ORDER — FENTANYL CITRATE (PF) 100 MCG/2ML IJ SOLN
25.0000 ug | INTRAMUSCULAR | Status: DC | PRN
  Administered 2019-01-14: 75 ug via INTRAVENOUS
  Filled 2019-01-14: qty 2

## 2019-01-14 MED ORDER — ROPIVACAINE HCL 2 MG/ML IJ SOLN
10.0000 mL | Freq: Once | INTRAMUSCULAR | Status: AC
  Administered 2019-01-14: 10 mL
  Filled 2019-01-14: qty 10

## 2019-01-14 NOTE — Patient Instructions (Signed)

## 2019-01-14 NOTE — Progress Notes (Signed)
Patient's Name: Vanessa Greer  MRN: [id number]  Referring Provider: Lynnea Ferrier, MD  DOB: 14-Sep-1961  PCP: Lynnea Ferrier, MD  DOS: 01/14/2019  Note by: Edward Jolly, MD  Service setting: Ambulatory outpatient  Specialty: Interventional Pain Management  Patient type: Established  Location: [facility] (AMB) Pain Management Facility  Visit type: Interventional Procedure   Primary Reason for Visit: Interventional Pain Management Treatment. CC: Back Pain (lumbar, left is worse )  Procedure:          Anesthesia, Analgesia, Anxiolysis:  Type: Lumbar Facet, Medial Branch Block(s) #2  Primary Purpose: Diagnostic Region: Posterolateral Lumbosacral Spine Level: L3, L4, L5, & S1 Medial Branch Level(s). Injecting these levels blocks the L3-4, L4-5, and L5-S1 lumbar facet joints. Laterality: Bilateral  Type: Moderate (Conscious) Sedation combined with Local Anesthesia Indication(s): Analgesia and Anxiety Route: Intravenous (IV) IV Access: Secured Sedation: Meaningful verbal contact was maintained at all times during the procedure  Local Anesthetic: Lidocaine 1-2%  Position: Prone   Indications: 1. Lumbar facet arthropathy   2. Lumbar spondylosis    Pain Score: Pre-procedure: 4 /10 Post-procedure: 0-No pain/10  Pre-op Assessment:  Vanessa Greer is a 58 y.o. (year old), female patient, seen today for interventional treatment. She  has a past surgical history that includes Abdominal hysterectomy; Foot surgery; Ectopic pregnancy surgery; Colonoscopy; and Laser ablation condolamata (N/A, 12/13/2017). Vanessa Greer has a current medication list which includes the following prescription(s): vitamin d3, cyclobenzaprine, hydrocodone-acetaminophen, hydrocodone-acetaminophen, lyrica, multivitamin with minerals, omega-3 fatty acids, pantoprazole, sertraline, simvastatin, and turmeric, and the following Facility-Administered Medications: fentanyl. Her primarily concern today is the Back Pain (lumbar, left is  worse )  Initial Vital Signs:  Pulse/HCG Rate: 73ECG Heart Rate: 71 Temp: 98.4 F (36.9 C) Resp: 16 BP: (!) 111/48 SpO2: 98 %  BMI: Estimated body mass index is 25.84 kg/m as calculated from the following:   Height as of this encounter: 5\' 7"  (1.702 m).   Weight as of this encounter: 165 lb (74.8 kg).  Risk Assessment: Allergies: Reviewed. She is allergic to bupropion.  Allergy Precautions: None required Coagulopathies: Reviewed. None identified.  Blood-thinner therapy: None at this time Active Infection(s): Reviewed. None identified. Vanessa Greer is afebrile  Site Confirmation: Vanessa Greer was asked to confirm the procedure and laterality before marking the site Procedure checklist: Completed Consent: Before the procedure and under the influence of no sedative(s), amnesic(s), or anxiolytics, the patient was informed of the treatment options, risks and possible complications. To fulfill our ethical and legal obligations, as recommended by the American Medical Association's Code of Ethics, I have informed the patient of my clinical impression; the nature and purpose of the treatment or procedure; the risks, benefits, and possible complications of the intervention; the alternatives, including doing nothing; the risk(s) and benefit(s) of the alternative treatment(s) or procedure(s); and the risk(s) and benefit(s) of doing nothing. The patient was provided information about the general risks and possible complications associated with the procedure. These may include, but are not limited to: failure to achieve desired goals, infection, bleeding, organ or nerve damage, allergic reactions, paralysis, and death. In addition, the patient was informed of those risks and complications associated to Spine-related procedures, such as failure to decrease pain; infection (i.e.: Meningitis, epidural or intraspinal abscess); bleeding (i.e.: epidural hematoma, subarachnoid hemorrhage, or any other type of  intraspinal or peri-dural bleeding); organ or nerve damage (i.e.: Any type of peripheral nerve, nerve root, or spinal cord injury) with subsequent damage to sensory, motor, and/or  autonomic systems, resulting in permanent pain, numbness, and/or weakness of one or several areas of the body; allergic reactions; (i.e.: anaphylactic reaction); and/or death. Furthermore, the patient was informed of those risks and complications associated with the medications. These include, but are not limited to: allergic reactions (i.e.: anaphylactic or anaphylactoid reaction(s)); adrenal axis suppression; blood sugar elevation that in diabetics may result in ketoacidosis or comma; water retention that in patients with history of congestive heart failure may result in shortness of breath, pulmonary edema, and decompensation with resultant heart failure; weight gain; swelling or edema; medication-induced neural toxicity; particulate matter embolism and blood vessel occlusion with resultant organ, and/or nervous system infarction; and/or aseptic necrosis of one or more joints. Finally, the patient was informed that Medicine is not an exact science; therefore, there is also the possibility of unforeseen or unpredictable risks and/or possible complications that may result in a catastrophic outcome. The patient indicated having understood very clearly. We have given the patient no guarantees and we have made no promises. Enough time was given to the patient to ask questions, all of which were answered to the patient's satisfaction. [name] has indicated that she wanted to continue with the procedure. Attestation: I, the ordering provider, attest that I have discussed with the patient the benefits, risks, side-effects, alternatives, likelihood of achieving goals, and potential problems during recovery for the procedure that I have provided informed consent. Date  Time: 01/14/2019  8:35 AM  Pre-Procedure Preparation:  Monitoring:  As per clinic protocol. Respiration, ETCO2, SpO2, BP, heart rate and rhythm monitor placed and checked for adequate function Safety Precautions: Patient was assessed for positional comfort and pressure points before starting the procedure. Time-out: I initiated and conducted the "Time-out" before starting the procedure, as per protocol. The patient was asked to participate by confirming the accuracy of the "Time Out" information. Verification of the correct person, site, and procedure were performed and confirmed by me, the nursing staff, and the patient. "Time-out" conducted as per Joint Commission's Universal Protocol (UP.01.01.01). Time: 0913  Description of Procedure:          Laterality: Bilateral. The procedure was performed in identical fashion on both sides. Levels:  L3, L4, L5, & S1 Medial Branch Level(s) Area Prepped: Posterior Lumbosacral Region Prepping solution: ChloraPrep (2% chlorhexidine gluconate and 70% isopropyl alcohol) Safety Precautions: Aspiration looking for blood return was conducted prior to all injections. At no point did we inject any substances, as a needle was being advanced. Before injecting, the patient was told to immediately notify me if she was experiencing any new onset of "ringing in the ears, or metallic taste in the mouth". No attempts were made at seeking any paresthesias. Safe injection practices and needle disposal techniques used. Medications properly checked for expiration dates. SDV (single dose vial) medications used. After the completion of the procedure, all disposable equipment used was discarded in the proper designated medical waste containers. Local Anesthesia: Protocol guidelines were followed. The patient was positioned over the fluoroscopy table. The area was prepped in the usual manner. The time-out was completed. The target area was identified using fluoroscopy. A 12-in long, straight, sterile hemostat was used with fluoroscopic guidance to locate  the targets for each level blocked. Once located, the skin was marked with an approved surgical skin marker. Once all sites were marked, the skin (epidermis, dermis, and hypodermis), as well as deeper tissues (fat, connective tissue and muscle) were infiltrated with a small amount of a short-acting local anesthetic, loaded on  a 10cc syringe with a 25G, 1.5-in  Needle. An appropriate amount of time was allowed for local anesthetics to take effect before proceeding to the next step. Local Anesthetic: Lidocaine 2.0% The unused portion of the local anesthetic was discarded in the proper designated containers. Technical explanation of process:   L3 Medial Branch Nerve Block (MBB): The target area for the L3 medial branch is at the junction of the postero-lateral aspect of the superior articular process and the superior, posterior, and medial edge of the transverse process of L4. Under fluoroscopic guidance, a Quincke needle was inserted until contact was made with os over the superior postero-lateral aspect of the pedicular shadow (target area). After negative aspiration for blood, 2mL of the nerve block solution was injected without difficulty or complication. The needle was removed intact. L4 Medial Branch Nerve Block (MBB): The target area for the L4 medial branch is at the junction of the postero-lateral aspect of the superior articular process and the superior, posterior, and medial edge of the transverse process of L5. Under fluoroscopic guidance, a Quincke needle was inserted until contact was made with os over the superior postero-lateral aspect of the pedicular shadow (target area). After negative aspiration for blood, 1 mL of the nerve block solution was injected without difficulty or complication. The needle was removed intact. L5 Medial Branch Nerve Block (MBB): The target area for the L5 medial branch is at the junction of the postero-lateral aspect of the superior articular process and the superior,  posterior, and medial edge of the sacral ala. Under fluoroscopic guidance, a Quincke needle was inserted until contact was made with os over the superior postero-lateral aspect of the pedicular shadow (target area). After negative aspiration for blood, 1 mL of the nerve block solution was injected without difficulty or complication. The needle was removed intact. S1 Medial Branch Nerve Block (MBB): The target area for the S1 medial branch is at the posterior and inferior 6 o'clock position of the L5-S1 facet joint. Under fluoroscopic guidance, the Quincke needle inserted for the L5 MBB was redirected until contact was made with os over the inferior and postero aspect of the sacrum, at the 6 o' clock position under the L5-S1 facet joint (Target area). After negative aspiration for blood, 10mL of the nerve block solution was injected without difficulty or complication. The needle was removed intact. Procedural Needles: 22-gauge, 3.5-inch, Quincke needles used for all levels. Nerve block solution: 10 cc solution made of 8 cc of 0.2% ropivacaine, 2 cc of Decadron 10 mg/cc.  1 to 1.5 cc injected at each level above bilaterally.  The unused portion of the solution was discarded in the proper designated containers.  Once the entire procedure was completed, the treated area was cleaned, making sure to leave some of the prepping solution back to take advantage of its long term bactericidal properties.   Illustration of the posterior view of the lumbar spine and the posterior neural structures. Laminae of L2 through S1 are labeled. DPRL5, dorsal primary ramus of L5; DPRS1, dorsal primary ramus of S1; DPR3, dorsal primary ramus of L3; FJ, facet (zygapophyseal) joint L3-L4; I, inferior articular process of L4; LB1, lateral branch of dorsal primary ramus of L1; IAB, inferior articular branches from L3 medial branch (supplies L4-L5 facet joint); IBP, intermediate branch plexus; MB3, medial branch of dorsal primary ramus of  L3; NR3, third lumbar nerve root; S, superior articular process of L5; SAB, superior articular branches from L4 (supplies L4-5 facet joint also); TP3,  transverse process of L3.  Vitals:   01/14/19 0931 01/14/19 0940 01/14/19 0950 01/14/19 0959  BP: 122/80 108/65 (!) 127/53 124/60  Pulse:      Resp: 15 16 12 14   Temp:      TempSrc:      SpO2: 96% 98% 99% 100%  Weight:      Height:         Start Time: 0913 hrs. End Time: 0931 hrs.  Imaging Guidance (Spinal):          Type of Imaging Technique: Fluoroscopy Guidance (Spinal) Indication(s): Assistance in needle guidance and placement for procedures requiring needle placement in or near specific anatomical locations not easily accessible without such assistance. Exposure Time: Please see nurses notes. Contrast: None used. Fluoroscopic Guidance: I was personally present during the use of fluoroscopy. "Tunnel Vision Technique" used to obtain the best possible view of the target area. Parallax error corrected before commencing the procedure. "Direction-depth-direction" technique used to introduce the needle under continuous pulsed fluoroscopy. Once target was reached, antero-posterior, oblique, and lateral fluoroscopic projection used confirm needle placement in all planes. Images permanently stored in EMR. Interpretation: No contrast injected. I personally interpreted the imaging intraoperatively. Adequate needle placement confirmed in multiple planes. Permanent images saved into the patient's record.  Antibiotic Prophylaxis:   Anti-infectives (From admission, onward)   None     Indication(s): None identified  Post-operative Assessment:  Post-procedure Vital Signs:  Pulse/HCG Rate: 7378 Temp: 98.4 F (36.9 C) Resp: 14 BP: 124/60 SpO2: 100 %  EBL: None  Complications: No immediate post-treatment complications observed by team, or reported by patient.  Note: The patient tolerated the entire procedure well. A repeat set of vitals  were taken after the procedure and the patient was kept under observation following institutional policy, for this type of procedure. Post-procedural neurological assessment was performed, showing return to baseline, prior to discharge. The patient was provided with post-procedure discharge instructions, including a section on how to identify potential problems. Should any problems arise concerning this procedure, the patient was given instructions to immediately contact us, at any time, without hesitation. In any case, we plan to contact the patient by telephone for a follow-up status report regarding this interventional procedure.  Comments:  No additional relevant information.  Plan of Care    Imaging Orders     DG C-Arm 1-60 Min-No Report Procedure Orders    No procedure(s) ordered today    Medications ordered for procedure: Meds ordered this encounter  Medications  . lactated ringers infusion 1,000 mL  . fentaNYL (SUBLIMAZE) injection 25-100 mcg    Make sure Narcan is available in the pyxis when using this medication. In the event of respiratory depression (RR< 8/min): Titrate NARCAN (naloxone) in increments of 0.1 to 0.2 mg IV at 2-3 minute intervals, until desired degree of reversal.  . ropivacaine (PF) 2 mg/mL (0.2%) (NAROPIN) injection 10 mL  . lidocaine (XYLOCAINE) 2 % (with pres) injection 400 mg  . dexamethasone (DECADRON) injection 10 mg  . dexamethasone (DECADRON) injection 10 mg   Medications administered: We administered lactated ringers, fentaNYL, ropivacaine (PF) 2 mg/mL (0.2%), lidocaine, dexamethasone, and dexamethasone.  See the medical record for exact dosing, route, and time of administration.  Disposition: Discharge home  Discharge Date & Time: 01/14/2019; 1001 hrs.     Future Appointments  Date Time Provider Department Center  02/10/2019  8:30 AM Edward JollyLateef, Kamber Vignola, MD [facility] None   Primary Care Physician: Lynnea FerrierKlein, Bert J III, MD [facility] Note by: Edward Jolly, MD Date: 01/14/2019; Time: 10:53 AM  Disclaimer:  Medicine is not an exact science. The only guarantee in medicine is that nothing is guaranteed. It is important to note that the decision to proceed with this intervention was based on the information collected from the patient. The Data and conclusions were drawn from the patient's questionnaire, the interview, and the physical examination. Because the information was provided in large part by the patient, it cannot be guaranteed that it has not been purposely or unconsciously manipulated. Every effort has been made to obtain as much relevant data as possible for this evaluation. It is important to note that the conclusions that lead to this procedure are derived in large part from the available data. Always take into account that the treatment will also be dependent on availability of resources and existing treatment guidelines, considered by other Pain Management Practitioners as being common knowledge and practice, at the time of the intervention. For Medico-Legal purposes, it is also important to point out that variation in procedural techniques and pharmacological choices are the acceptable norm. The indications, contraindications, technique, and results of the above procedure should only be interpreted and judged by a Board-Certified Interventional Pain Specialist with extensive familiarity and expertise in the same exact procedure and technique.

## 2019-01-15 ENCOUNTER — Telehealth: Payer: Self-pay | Admitting: *Deleted

## 2019-01-15 NOTE — Telephone Encounter (Signed)
Spoke with patient has no questions or concerns re; procedure on yesterday.

## 2019-02-10 ENCOUNTER — Encounter: Payer: Self-pay | Admitting: Student in an Organized Health Care Education/Training Program

## 2019-02-10 ENCOUNTER — Ambulatory Visit
Payer: BC Managed Care – PPO | Attending: Student in an Organized Health Care Education/Training Program | Admitting: Student in an Organized Health Care Education/Training Program

## 2019-02-10 ENCOUNTER — Other Ambulatory Visit: Payer: Self-pay

## 2019-02-10 VITALS — BP 111/77 | HR 68 | Temp 98.6°F | Resp 16 | Ht 67.0 in | Wt 165.0 lb

## 2019-02-10 DIAGNOSIS — Z79899 Other long term (current) drug therapy: Secondary | ICD-10-CM | POA: Diagnosis not present

## 2019-02-10 DIAGNOSIS — M5136 Other intervertebral disc degeneration, lumbar region: Secondary | ICD-10-CM | POA: Diagnosis not present

## 2019-02-10 DIAGNOSIS — M47816 Spondylosis without myelopathy or radiculopathy, lumbar region: Secondary | ICD-10-CM | POA: Diagnosis not present

## 2019-02-10 DIAGNOSIS — G894 Chronic pain syndrome: Secondary | ICD-10-CM | POA: Insufficient documentation

## 2019-02-10 MED ORDER — HYDROCODONE-ACETAMINOPHEN 10-325 MG PO TABS: 1.0000 | ORAL_TABLET | Freq: Three times a day (TID) | ORAL | 0 refills | Status: DC | PRN

## 2019-02-10 NOTE — Patient Instructions (Addendum)
Hydrocodone with acetaminophen, which can be filled 02/22/2019, to last until 04/23/2019 has been escribed to your pharmacy.  GENERAL RISKS AND COMPLICATIONS  What are the risk, side effects and possible complications? Generally speaking, most procedures are safe.  However, with any procedure there are risks, side effects, and the possibility of complications.  The risks and complications are dependent upon the sites that are lesioned, or the type of nerve block to be performed.  The closer the procedure is to the spine, the more serious the risks are.  Great care is taken when placing the radio frequency needles, block needles or lesioning probes, but sometimes complications can occur. 1. Infection: Any time there is an injection through the skin, there is a risk of infection.  This is why sterile conditions are used for these blocks.  There are four possible types of infection. 1. Localized skin infection. 2. Central Nervous System Infection-This can be in the form of Meningitis, which can be deadly. 3. Epidural Infections-This can be in the form of an epidural abscess, which can cause pressure inside of the spine, causing compression of the spinal cord with subsequent paralysis. This would require an emergency surgery to decompress, and there are no guarantees that the patient would recover from the paralysis. 4. Discitis-This is an infection of the intervertebral discs.  It occurs in about 1% of discography procedures.  It is difficult to treat and it may lead to surgery.        2. Pain: the needles have to go through skin and soft tissues, will cause soreness.       3. Damage to internal structures:  The nerves to be lesioned may be near blood vessels or    other nerves which can be potentially damaged.       4. Bleeding: Bleeding is more common if the patient is taking blood thinners such as  aspirin, Coumadin, Ticiid, Plavix, etc., or if he/she have some genetic predisposition  such as  hemophilia. Bleeding into the spinal canal can cause compression of the spinal  cord with subsequent paralysis.  This would require an emergency surgery to  decompress and there are no guarantees that the patient would recover from the  paralysis.       5. Pneumothorax:  Puncturing of a lung is a possibility, every time a needle is introduced in  the area of the chest or upper back.  Pneumothorax refers to free air around the  collapsed lung(s), inside of the thoracic cavity (chest cavity).  Another two possible  complications related to a similar event would include: Hemothorax and Chylothorax.   These are variations of the Pneumothorax, where instead of air around the collapsed  lung(s), you may have blood or chyle, respectively.       6. Spinal headaches: They may occur with any procedures in the area of the spine.       7. Persistent CSF (Cerebro-Spinal Fluid) leakage: This is a rare problem, but may occur  with prolonged intrathecal or epidural catheters either due to the formation of a fistulous  track or a dural tear.       8. Nerve damage: By working so close to the spinal cord, there is always a possibility of  nerve damage, which could be as serious as a permanent spinal cord injury with  paralysis.       9. Death:  Although rare, severe deadly allergic reactions known as "Anaphylactic  reaction" can occur to any of the  medications used.      10. Worsening of the symptoms:  We can always make thing worse.  What are the chances of something like this happening? Chances of any of this occuring are extremely low.  By statistics, you have more of a chance of getting killed in a motor vehicle accident: while driving to the hospital than any of the above occurring .  Nevertheless, you should be aware that they are possibilities.  In general, it is similar to taking a shower.  Everybody knows that you can slip, hit your head and get killed.  Does that mean that you should not shower again?  Nevertheless  always keep in mind that statistics do not mean anything if you happen to be on the wrong side of them.  Even if a procedure has a 1 (one) in a 1,000,000 (million) chance of going wrong, it you happen to be that one..Also, keep in mind that by statistics, you have more of a chance of having something go wrong when taking medications.  Who should not have this procedure? If you are on a blood thinning medication (e.g. Coumadin, Plavix, see list of "Blood Thinners"), or if you have an active infection going on, you should not have the procedure.  If you are taking any blood thinners, please inform your physician.  How should I prepare for this procedure?  Do not eat or drink anything at least six hours prior to the procedure.  Bring a driver with you .  It cannot be a taxi.  Come accompanied by an adult that can drive you back, and that is strong enough to help you if your legs get weak or numb from the local anesthetic.  Take all of your medicines the morning of the procedure with just enough water to swallow them.  If you have diabetes, make sure that you are scheduled to have your procedure done first thing in the morning, whenever possible.  If you have diabetes, take only half of your insulin dose and notify our nurse that you have done so as soon as you arrive at the clinic.  If you are diabetic, but only take blood sugar pills (oral hypoglycemic), then do not take them on the morning of your procedure.  You may take them after you have had the procedure.  Do not take aspirin or any aspirin-containing medications, at least eleven (11) days prior to the procedure.  They may prolong bleeding.  Wear loose fitting clothing that may be easy to take off and that you would not mind if it got stained with Betadine or blood.  Do not wear any jewelry or perfume  Remove any nail coloring.  It will interfere with some of our monitoring equipment.  NOTE: Remember that this is not meant to be  interpreted as a complete list of all possible complications.  Unforeseen problems may occur.  BLOOD THINNERS The following drugs contain aspirin or other products, which can cause increased bleeding during surgery and should not be taken for 2 weeks prior to and 1 week after surgery.  If you should need take something for relief of minor pain, you may take acetaminophen which is found in Tylenol,m Datril, Anacin-3 and Panadol. It is not blood thinner. The products listed below are.  Do not take any of the products listed below in addition to any listed on your instruction sheet.  A.P.C or A.P.C with Codeine Codeine Phosphate Capsules #3 Ibuprofen Ridaura  ABC compound Congesprin Imuran  rimadil  Advil Cope Indocin Robaxisal  Alka-Seltzer Effervescent Pain Reliever and Antacid Coricidin or Coricidin-D  Indomethacin Rufen  Alka-Seltzer plus Cold Medicine Cosprin Ketoprofen S-A-C Tablets  Anacin Analgesic Tablets or Capsules Coumadin Korlgesic Salflex  Anacin Extra Strength Analgesic tablets or capsules CP-2 Tablets Lanoril Salicylate  Anaprox Cuprimine Capsules Levenox Salocol  Anexsia-D Dalteparin Magan Salsalate  Anodynos Darvon compound Magnesium Salicylate Sine-off  Ansaid Dasin Capsules Magsal Sodium Salicylate  Anturane Depen Capsules Marnal Soma  APF Arthritis pain formula Dewitt's Pills Measurin Stanback  Argesic Dia-Gesic Meclofenamic Sulfinpyrazone  Arthritis Bayer Timed Release Aspirin Diclofenac Meclomen Sulindac  Arthritis pain formula Anacin Dicumarol Medipren Supac  Analgesic (Safety coated) Arthralgen Diffunasal Mefanamic Suprofen  Arthritis Strength Bufferin Dihydrocodeine Mepro Compound Suprol  Arthropan liquid Dopirydamole Methcarbomol with Aspirin Synalgos  ASA tablets/Enseals Disalcid Micrainin Tagament  Ascriptin Doan's Midol Talwin  Ascriptin A/D Dolene Mobidin Tanderil  Ascriptin Extra Strength Dolobid Moblgesic Ticlid  Ascriptin with Codeine Doloprin or Doloprin  with Codeine Momentum Tolectin  Asperbuf Duoprin Mono-gesic Trendar  Aspergum Duradyne Motrin or Motrin IB Triminicin  Aspirin plain, buffered or enteric coated Durasal Myochrisine Trigesic  Aspirin Suppositories Easprin Nalfon Trillsate  Aspirin with Codeine Ecotrin Regular or Extra Strength Naprosyn Uracel  Atromid-S Efficin Naproxen Ursinus  Auranofin Capsules Elmiron Neocylate Vanquish  Axotal Emagrin Norgesic Verin  Azathioprine Empirin or Empirin with Codeine Normiflo Vitamin E  Azolid Emprazil Nuprin Voltaren  Bayer Aspirin plain, buffered or children's or timed BC Tablets or powders Encaprin Orgaran Warfarin Sodium  Buff-a-Comp Enoxaparin Orudis Zorpin  Buff-a-Comp with Codeine Equegesic Os-Cal-Gesic   Buffaprin Excedrin plain, buffered or Extra Strength Oxalid   Bufferin Arthritis Strength Feldene Oxphenbutazone   Bufferin plain or Extra Strength Feldene Capsules Oxycodone with Aspirin   Bufferin with Codeine Fenoprofen Fenoprofen Pabalate or Pabalate-SF   Buffets II Flogesic Panagesic   Buffinol plain or Extra Strength Florinal or Florinal with Codeine Panwarfarin   Buf-Tabs Flurbiprofen Penicillamine   Butalbital Compound Four-way cold tablets Penicillin   Butazolidin Fragmin Pepto-Bismol   Carbenicillin Geminisyn Percodan   Carna Arthritis Reliever Geopen Persantine   Carprofen Gold's salt Persistin   Chloramphenicol Goody's Phenylbutazone   Chloromycetin Haltrain Piroxlcam   Clmetidine heparin Plaquenil   Cllnoril Hyco-pap Ponstel   Clofibrate Hydroxy chloroquine Propoxyphen         Before stopping any of these medications, be sure to consult the physician who ordered them.  Some, such as Coumadin (Warfarin) are ordered to prevent or treat serious conditions such as "deep thrombosis", "pumonary embolisms", and other heart problems.  The amount of time that you may need off of the medication may also vary with the medication and the reason for which you were taking it.   If you are taking any of these medications, please make sure you notify your pain physician before you undergo any procedures.   Moderate Conscious Sedation, Adult Sedation is the use of medicines to promote relaxation and relieve discomfort and anxiety. Moderate conscious sedation is a type of sedation. Under moderate conscious sedation, you are less alert than normal, but you are still able to respond to instructions, touch, or both. Moderate conscious sedation is used during short medical and dental procedures. It is milder than deep sedation, which is a type of sedation under which you cannot be easily woken up. It is also milder than general anesthesia, which is the use of medicines to make you unconscious. Moderate conscious sedation allows you to return to your regular activities sooner. Tell a  health care provider about:  Any allergies you have.  All medicines you are taking, including vitamins, herbs, eye drops, creams, and over-the-counter medicines.  Use of steroids (by mouth or creams).  Any problems you or family members have had with sedatives and anesthetic medicines.  Any blood disorders you have.  Any surgeries you have had.  Any medical conditions you have, such as sleep apnea.  Whether you are pregnant or may be pregnant.  Any use of cigarettes, alcohol, marijuana, or street drugs. What are the risks? Generally, this is a safe procedure. However, problems may occur, including:  Getting too much medicine (oversedation).  Nausea.  Allergic reaction to medicines.  Trouble breathing. If this happens, a breathing tube may be used to help with breathing. It will be removed when you are awake and breathing on your own.  Heart trouble.  Lung trouble. What happens before the procedure? Staying hydrated Follow instructions from your health care provider about hydration, which may include:  Up to 2 hours before the procedure - you may continue to drink clear  liquids, such as water, clear fruit juice, black coffee, and plain tea. Eating and drinking restrictions Follow instructions from your health care provider about eating and drinking, which may include:  8 hours before the procedure - stop eating heavy meals or foods such as meat, fried foods, or fatty foods.  6 hours before the procedure - stop eating light meals or foods, such as toast or cereal.  6 hours before the procedure - stop drinking milk or drinks that contain milk.  2 hours before the procedure - stop drinking clear liquids. Medicine Ask your health care provider about:  Changing or stopping your regular medicines. This is especially important if you are taking diabetes medicines or blood thinners.  Taking medicines such as aspirin and ibuprofen. These medicines can thin your blood. Do not take these medicines before your procedure if your health care provider instructs you not to.  Tests and exams  You will have a physical exam.  You may have blood tests done to show: ? How well your kidneys and liver are working. ? How well your blood can clot. General instructions  Plan to have someone take you home from the hospital or clinic.  If you will be going home right after the procedure, plan to have someone with you for 24 hours. What happens during the procedure?  An IV tube will be inserted into one of your veins.  Medicine to help you relax (sedative) will be given through the IV tube.  The medical or dental procedure will be performed. What happens after the procedure?  Your blood pressure, heart rate, breathing rate, and blood oxygen level will be monitored often until the medicines you were given have worn off.  Do not drive for 24 hours. This information is not intended to replace advice given to you by your health care provider. Make sure you discuss any questions you have with your health care provider. Document Released: 09/11/2001 Document Revised:  05/22/2016 Document Reviewed: 04/07/2016 Elsevier Interactive Patient Education  2019 [employer]   Radiofrequency Lesioning Radiofrequency lesioning is a procedure that is performed to relieve pain. The procedure is often used for back, neck, or arm pain. Radiofrequency lesioning involves the use of a machine that creates radio waves to make heat. During the procedure, the heat is applied to the nerve that carries the pain signal. The heat damages the nerve and interferes with the pain signal.  Pain relief usually starts about 2 weeks after the procedure and lasts for 6 months to 1 year. Tell a health care provider about:  Any allergies you have.  All medicines you are taking, including vitamins, herbs, eye drops, creams, and over-the-counter medicines.  Any problems you or family members have had with anesthetic medicines.  Any blood disorders you have.  Any surgeries you have had.  Any medical conditions you have.  Whether you are pregnant or may be pregnant. What are the risks? Generally, this is a safe procedure. However, problems may occur, including:  Pain or soreness at the injection site.  Infection at the injection site.  Damage to nerves or blood vessels. What happens before the procedure?  Ask your health care provider about: ? Changing or stopping your regular medicines. This is especially important if you are taking diabetes medicines or blood thinners. ? Taking medicines such as aspirin and ibuprofen. These medicines can thin your blood. Do not take these medicines before your procedure if your health care provider instructs you not to.  Follow instructions from your health care provider about eating or drinking restrictions.  Plan to have someone take you home after the procedure.  If you go home right after the procedure, plan to have someone with you for 24 hours. What happens during the procedure?  You will be given one or more of the following: ? A  medicine to help you relax (sedative). ? A medicine to numb the area (local anesthetic).  You will be awake during the procedure. You will need to be able to talk with the health care provider during the procedure.  With the help of a type of X-ray (fluoroscopy), the health care provider will insert a radiofrequency needle into the area to be treated.  Next, a wire that carries the radio waves (electrode) will be put through the radiofrequency needle. An electrical pulse will be sent through the electrode to verify the correct nerve. You will feel a tingling sensation, and you may have muscle twitching.  Then, the tissue that is around the needle tip will be heated by an electric current that is passed using the radiofrequency machine. This will numb the nerves.  A bandage (dressing) will be put on the insertion area after the procedure is done. The procedure may vary among health care providers and hospitals. What happens after the procedure?  Your blood pressure, heart rate, breathing rate, and blood oxygen level will be monitored often until the medicines you were given have worn off.  Return to your normal activities as directed by your health care provider. This information is not intended to replace advice given to you by your health care provider. Make sure you discuss any questions you have with your health care provider. Document Released: 08/15/2011 Document Revised: 05/24/2016 Document Reviewed: 01/24/2015 Elsevier Interactive Patient Education  2019 [employer].

## 2019-02-10 NOTE — Progress Notes (Signed)
Patient's Name: Vanessa Greer  MRN: [id number]  Referring Provider: Adin Hector, MD  DOB: 06/02/1961  PCP: Adin Hector, MD  DOS: 02/10/2019  Note by: Gillis Santa, MD  Service setting: Ambulatory outpatient  Specialty: Interventional Pain Management  Location: [facility] (AMB) Pain Management Facility    Patient type: Established   Primary Reason(s) for Visit: Encounter for prescription drug management & post-procedure evaluation of chronic illness with mild to moderate exacerbation(Level of risk: moderate) CC: Back Pain (lower)  HPI  Vanessa Greer is a 58 y.o. year old, female patient, who comes today for a post-procedure evaluation and medication management. 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 Her primarily concern today is the Back Pain (lower)  Pain Assessment: Location: Lower Back Radiating: denies Onset: More than a month ago Duration: Chronic pain Quality: Nagging(. ) Severity: 0-No pain/10 (subjective, self-reported pain score)  Note: Reported level is compatible with observation.                         When using our objective Pain Scale, levels between 6 and 10/10 are said to belong in an emergency room, as it progressively worsens from a 6/10, described as severely limiting, requiring emergency care not usually available at an outpatient pain management facility. At a 6/10 level, communication becomes difficult and requires great effort. Assistance to reach the emergency department may be required. Facial flushing and profuse sweating along with potentially dangerous increases in heart rate and blood pressure will be evident. Effect on ADL: walking makes it worse Timing:  Intermittent(level of pain escalates as the day goes on) Modifying factors: procedure, hot showers, medication BP: 111/77  HR: 68  Vanessa Greer was last seen on 01/15/2019 for a procedure. During today's appointment we reviewed Ms. Dry's post-procedure results, as well as her outpatient medication regimen.  Further details on both, my assessment(s), as well as the proposed treatment plan, please see below.  Controlled Substance Pharmacotherapy Assessment REMS (Risk Evaluation and Mitigation Strategy)   01/23/2019  1   12/16/2018  Hydrocodone-Acetamin 10-325 MG  90.00 30 Bi Lat   [id number]   [place] (5798)   0  30.00 MME  Comm Ins   Van    [name], RN  02/10/2019  8:41 AM  Signed Nursing Pain Medication Assessment:  Safety precautions to be maintained throughout the outpatient stay will include: orient to surroundings, keep bed in low position, maintain call bell within reach at all times, provide assistance with transfer out of bed and ambulation.  Medication Inspection Compliance: Pill count conducted under aseptic conditions, in front of the patient. Neither the pills nor the bottle was removed from the patient's sight at any time. Once count was completed pills were immediately returned to the patient in their original bottle.  Medication: Hydrocodone/APAP Pill/Patch Count: 45 of 90 pills remain Pill/Patch Appearance: Markings consistent with prescribed medication Bottle Appearance: Standard pharmacy container. Clearly labeled. Filled Date: 01 / 24 / 2020 Last Medication intake:  Today   Pharmacokinetics: Liberation and absorption (onset of action): WNL Distribution (time to peak effect): WNL Metabolism and excretion (duration of action): WNL         Pharmacodynamics: Desired effects: Analgesia: Vanessa Greer reports >50% benefit. Functional ability:  Patient reports that medication allows her to accomplish basic ADLs Clinically meaningful improvement in function (CMIF): Sustained  CMIF goals met Perceived effectiveness: Described as relatively effective, allowing for increase in activities of daily living (ADL) Undesirable effects: Side-effects or Adverse reactions: None reported Monitoring: [place] PMP: Online review of the past 67-monthperiod conducted. Compliant with practice rules and regulations Last UDS on record: Summary  Date Value Ref Range Status  12/16/2018 FINAL  Final    Comment:    ==================================================================== TOXASSURE SELECT 13 (MW) ==================================================================== Test                             Result       Flag       Units Drug Present and Declared for Prescription Verification   Hydrocodone                    916          EXPECTED   ng/mg creat   Hydromorphone                  272          EXPECTED   ng/mg creat   Dihydrocodeine                 308          EXPECTED   ng/mg creat   Norhydrocodone                 2268         EXPECTED   ng/mg creat    Sources of hydrocodone include scheduled prescription    medications. Hydromorphone, dihydrocodeine and norhydrocodone are    expected metabolites of hydrocodone. Hydromorphone and    dihydrocodeine are also available as scheduled prescription    medications. ==================================================================== Test                      Result    Flag   Units      Ref Range   Creatinine              25               mg/dL      >=20 ==================================================================== Declared Medications:  The flagging and interpretation on this report are based on the  following declared medications.  Unexpected results may arise from  inaccuracies in the declared medications.  **Note: The testing scope of this panel includes these medications:  Hydrocodone (Hydrocodone-Acetaminophen)  **Note: The testing scope of this panel does not include following  reported medications:   Acetaminophen (Hydrocodone-Acetaminophen)  Cyclobenzaprine  Multivitamin (MVI)  Omega-3 Fatty Acids  Pantoprazole  Pregabalin  Sertraline  Simvastatin  Turmeric  Vitamin D3 ==================================================================== For clinical consultation, please call ([phone] ====================================================================    UDS interpretation: Compliant          Medication Assessment Form: Reviewed. Patient indicates being compliant with therapy Treatment compliance: Compliant Risk Assessment Profile: Aberrant behavior: See initial evaluations. None observed or detected today Comorbid factors increasing risk of overdose: See initial evaluation. No additional risks detected today Opioid risk tool (ORT):  Opioid Risk  02/10/2019  Alcohol 1  Illegal Drugs 0  Rx Drugs 0  Alcohol 0  Illegal Drugs 0  Rx Drugs 0  Age between 16-45 years  0  History of Preadolescent Sexual Abuse 0  Psychological Disease 0  Depression 0  Opioid Risk Tool Scoring 1  Opioid Risk Interpretation Low Risk    ORT Scoring interpretation table:  Score <3 = Low Risk for SUD  Score between 4-7 = Moderate Risk for SUD  Score >8 = High Risk for Opioid Abuse   Risk of substance use disorder (SUD): Low  Risk Mitigation Strategies:  Patient Counseling: Covered Patient-Prescriber Agreement (PPA): Present and active  Notification to other healthcare providers: Done  Pharmacologic Plan: No change in therapy, at this time.             Post-Procedure Assessment  01/14/2019 Procedure: Bilateral L3, L4, L5, S1 facet medial branch nerve block #2 Pre-procedure pain score:  4/10 Post-procedure pain score: 0/10         Influential Factors: BMI: 25.84 kg/m Intra-procedural challenges: None observed.         Assessment challenges: None detected.              Reported side-effects: None.        Post-procedural adverse reactions or complications: None reported          Sedation: Please see nurses note. When no sedatives are used, the analgesic levels obtained are directly associated to the effectiveness of the local anesthetics. However, when sedation is provided, the level of analgesia obtained during the initial 1 hour following the intervention, is believed to be the result of a combination of factors. These factors may include, but are not limited to: 1. The effectiveness of the local anesthetics used. 2. The effects of the analgesic(s) and/or anxiolytic(s) used. 3. The degree of discomfort experienced by the patient at the time of the procedure. 4. The patients ability and reliability in recalling and recording the events. 5. The presence and influence of possible secondary gains and/or psychosocial factors. Reported result: Relief experienced during the 1st hour after the procedure: 80 % (Ultra-Short Term Relief)            Interpretative annotation: Clinically appropriate result. Analgesia during this period is likely to be Local Anesthetic and/or IV Sedative (Analgesic/Anxiolytic) related.          Effects of local anesthetic: The analgesic effects attained during this period are directly associated to the localized infiltration of local anesthetics and therefore cary significant diagnostic value as to the etiological location, or anatomical origin, of the pain. Expected duration of relief is directly dependent on the pharmacodynamics of the local anesthetic used. Long-acting (4-6 hours) anesthetics used.  Reported result: Relief during the next 4 to 6 hour after the procedure: 80 % (Short-Term Relief)            Interpretative annotation: Clinically appropriate result. Analgesia during this period is likely to be Local Anesthetic-related.          Long-term benefit: Defined as the period of time past the expected duration of local anesthetics (1 hour for short-acting and 4-6 hours for long-acting). With the possible exception of prolonged sympathetic  blockade from the local anesthetics, benefits during this period are typically attributed to, or associated with, other factors such as analgesic sensory neuropraxia, antiinflammatory effects, or beneficial biochemical changes provided by agents other than the local anesthetics.  Reported result: Extended relief following procedure: 80 %(pain not as intense as before procedure, pt states results of this block "better than" results of first block.) (Long-Term Relief)            Interpretative annotation: Clinically possible results. Good relief. No permanent benefit expected. Inflammation plays a part in the  etiology to the pain.          Current benefits: Defined as reported results that persistent at this point in time.   Analgesia: <50 %            Function: Somewhat improved ROM: Somewhat improved Interpretative annotation: Recurrence of symptoms. No permanent benefit expected. Effective diagnostic intervention.          Interpretation: Results would suggest a successful diagnostic intervention.                  Plan:  Proceed with Radiofrequency Ablation for the purpose of attaining long-term benefits.                 Meds   Current Outpatient Medications:  .  Cholecalciferol (VITAMIN D3) 3000 units TABS, Take 1,000 Units by mouth daily., Disp: , Rfl:  .  cyclobenzaprine (FLEXERIL) 10 MG tablet, Take 10 mg by mouth 2 (two) times daily as needed for muscle spasms., Disp: , Rfl:  .  [START ON 02/22/2019] HYDROcodone-acetaminophen (NORCO) 10-325 MG tablet, Take 1 tablet by mouth every 8 (eight) hours as needed for up to 30 days for severe pain., Disp: 90 tablet, Rfl: 0 .  LYRICA 150 MG capsule, 150 mg 2 (two) times daily. , Disp: , Rfl: 0 .  Multiple Vitamin (MULTIVITAMIN WITH MINERALS) TABS tablet, Take 1 tablet by mouth daily., Disp: , Rfl:  .  Omega-3 Fatty Acids (OMEGA 3 500 PO), Take by mouth daily., Disp: , Rfl:  .  pantoprazole (PROTONIX) 40 MG tablet, Take 40 mg by mouth 2 (two) times  daily. , Disp: , Rfl: 0 .  sertraline (ZOLOFT) 50 MG tablet, 100 mg daily. , Disp: , Rfl: 0 .  simvastatin (ZOCOR) 40 MG tablet, 40 mg daily at 6 PM. , Disp: , Rfl: 0 .  Turmeric 500 MG CAPS, Take by mouth daily., Disp: , Rfl:  .  [START ON 03/24/2019] HYDROcodone-acetaminophen (NORCO) 10-325 MG tablet, Take 1 tablet by mouth every 8 (eight) hours as needed for up to 30 days for severe pain., Disp: 90 tablet, Rfl: 0  ROS  Constitutional: Denies any fever or chills Gastrointestinal: No reported hemesis, hematochezia, vomiting, or acute GI distress Musculoskeletal: Denies any acute onset joint swelling, redness, loss of ROM, or weakness Neurological: No reported episodes of acute onset apraxia, aphasia, dysarthria, agnosia, amnesia, paralysis, loss of coordination, or loss of consciousness  Allergies  Ms. Schaffer is allergic to bupropion.  [place]  Drug: [name]  reports no history of drug use. Alcohol:  reports current alcohol use. Tobacco:  reports that she has been smoking cigarettes. She has a 41.00 pack-year smoking history. She has never used smokeless tobacco. Medical:  has a past medical history of Anxiety, Back pain, DDD (degenerative disc disease), lumbar, Depression, GERD (gastroesophageal reflux disease), High cholesterol, Lumbar radiculitis, and Pneumothorax (1982). Surgical: Ms. Jerez  has a past surgical history that includes Abdominal hysterectomy; Foot surgery; Ectopic pregnancy surgery; Colonoscopy; and Laser ablation condolamata (N/A, 12/13/2017). Family: family history includes Breast cancer (age of onset: 42) in her mother.  Constitutional Exam  General appearance: Well nourished, well developed, and well hydrated. In no apparent acute distress Vitals:   02/10/19 0846  BP: 111/77  Pulse: 68  Resp: 16  Temp: 98.6 F (37 C)  TempSrc: Oral  SpO2: 100%  Weight: 165 lb (74.8 kg)  Height: '5\' 7"'  (1.702 m)   BMI Assessment: Estimated body mass index is 25.84 kg/m as  calculated from the following:   Height as of this encounter: '5\' 7"'  (1.702 m).   Weight as of this encounter: 165 lb (74.8 kg).  BMI interpretation table: BMI level Category Range association with higher incidence of chronic pain  <18 kg/m2 Underweight   18.5-24.9 kg/m2 Ideal body weight   25-29.9 kg/m2 Overweight Increased incidence by 20%  30-34.9 kg/m2 Obese (Class I) Increased incidence by 68%  35-39.9 kg/m2 Severe obesity (Class II) Increased incidence by 136%  >40 kg/m2 Extreme obesity (Class III) Increased incidence by 254%   Patient's current BMI Ideal Body weight  Body mass index is 25.84 kg/m. Ideal body weight: 61.6 kg (135 lb 12.9 oz) Adjusted ideal body weight: 66.9 kg (147 lb 7.7 oz)   BMI Readings from Last 4 Encounters:  02/10/19 25.84 kg/m  01/14/19 25.84 kg/m  12/16/18 25.84 kg/m  11/18/18 25.06 kg/m   Wt Readings from Last 4 Encounters:  02/10/19 165 lb (74.8 kg)  01/14/19 165 lb (74.8 kg)  12/16/18 165 lb (74.8 kg)  11/18/18 160 lb (72.6 kg)  Psych/Mental status: Alert, oriented x 3 (person, place, & time)       Eyes: PERLA Respiratory: No evidence of acute respiratory distress  Cervical Spine Area Exam  Skin & Axial Inspection: No masses, redness, edema, swelling, or associated skin lesions Alignment: Symmetrical Functional ROM: Unrestricted ROM      Stability: No instability detected Muscle Tone/Strength: Functionally intact. No obvious neuro-muscular anomalies detected. Sensory (Neurological): Unimpaired Palpation: No palpable anomalies               Thoracic Spine Area Exam  Skin & Axial Inspection: No masses, redness, or swelling Alignment: Symmetrical Functional ROM: Unrestricted ROM Stability: No instability detected Muscle Tone/Strength: Functionally intact. No obvious neuro-muscular anomalies detected. Sensory (Neurological): Unimpaired Muscle strength & Tone: No palpable anomalies  Lumbar Spine Area Exam  Skin & Axial Inspection:  No masses, redness, or swelling Alignment: Symmetrical Functional ROM: Improved after treatment affecting both sides Stability: No instability detected Muscle Tone/Strength: Functionally intact. No obvious neuro-muscular anomalies detected. Sensory (Neurological): Unimpaired Palpation: No palpable anomalies       Provocative Tests: Hyperextension/rotation test: (+) bilaterally for facet joint pain. Lumbar quadrant test (Kemp's test): deferred today       Lateral bending test: deferred today       Patrick's Maneuver: deferred today                   FABER* test: deferred today                   S-I anterior distraction/compression test: deferred today         S-I lateral compression test: deferred today         S-I Thigh-thrust test: deferred today         S-I Gaenslen's test: deferred today         *(Flexion, ABduction and External Rotation)  Gait & Posture Assessment  Ambulation: Unassisted Gait: Relatively normal for age and body habitus Posture: WNL   Lower Extremity Exam    Side: Right lower extremity  Side: Left lower extremity  Stability: No instability observed          Stability: No instability observed          Skin & Extremity Inspection: Skin color, temperature, and hair growth are WNL. No peripheral edema or cyanosis. No masses, redness, swelling, asymmetry, or associated skin lesions. No contractures.  Skin & Extremity Inspection:  Skin color, temperature, and hair growth are WNL. No peripheral edema or cyanosis. No masses, redness, swelling, asymmetry, or associated skin lesions. No contractures.  Functional ROM: Unrestricted ROM                  Functional ROM: Unrestricted ROM                  Muscle Tone/Strength: Functionally intact. No obvious neuro-muscular anomalies detected.  Muscle Tone/Strength: Functionally intact. No obvious neuro-muscular anomalies detected.  Sensory (Neurological): Unimpaired        Sensory (Neurological): Unimpaired        DTR: Patellar:  deferred today Achilles: deferred today Plantar: deferred today  DTR: Patellar: deferred today Achilles: deferred today Plantar: deferred today  Palpation: No palpable anomalies  Palpation: No palpable anomalies   Assessment   Status Diagnosis  Responding Responding Responding 1. Lumbar facet arthropathy   2. Lumbar spondylosis   3. Lumbar degenerative disc disease   4. Chronic pain syndrome   5. Controlled substance agreement signed     General Recommendations: The pain condition that the patient suffers from is best treated with a multidisciplinary approach that involves an increase in physical activity to prevent de-conditioning and worsening of the pain cycle, as well as psychological counseling (formal and/or informal) to address the co-morbid psychological affects of pain. Treatment will often involve judicious use of pain medications and interventional procedures to decrease the pain, allowing the patient to participate in the physical activity that will ultimately produce long-lasting pain reductions. The goal of the multidisciplinary approach is to return the patient to a higher level of overall function and to restore their ability to perform activities of daily living.  Patient follows up status post bilateral L3, L4, L5, S1 lumbar facet medial branch nerve blocks.  This was her second diagnostic set.  She states that this previous one was more effective than her first 1.  She endorses that both provided her with greater than 50% pain relief for at least 14 days.  She is interested in proceeding with lumbar radiofrequency ablation for the purpose of attaining longer duration of pain relief.  We will start with the left side first.  Risks and benefits of this procedure were discussed in great detail and patient is in agreement with treatment plan.  I will also refill her hydrocodone as below for 2 months.  [place] PMP checked and appropriate.  Plan: -Left L3, L4, L5, S1 RFA followed by  right. -Refill of hydrocodone as below consider weaning back down to 7.5 mg 3 times daily after ablation -Continue with Lyrica 150 mg twice daily, turmeric.   Plan of Care  Pharmacotherapy (Medications Ordered): Meds ordered this encounter  Medications  . HYDROcodone-acetaminophen (NORCO) 10-325 MG tablet    Sig: Take 1 tablet by mouth every 8 (eight) hours as needed for up to 30 days for severe pain.    Dispense:  90 tablet    Refill:  0    Do not place this medication, or any other prescription from our practice, on "Automatic Refill". Patient may have prescription filled one day early if pharmacy is closed on scheduled refill date.  Marland Kitchen HYDROcodone-acetaminophen (NORCO) 10-325 MG tablet    Sig: Take 1 tablet by mouth every 8 (eight) hours as needed for up to 30 days for severe pain.    Dispense:  90 tablet    Refill:  0    Do not place this medication, or any other prescription  from our practice, on "Automatic Refill". Patient may have prescription filled one day early if pharmacy is closed on scheduled refill date.   Lab-work, procedure(s), and/or referral(s): Orders Placed This Encounter  Procedures  . Radiofrequency,Lumbar    Time Note: Greater than 50% of the 25 minute(s) of face-to-face time spent with Ms. Dugo, was spent in counseling/coordination of care regarding: the appropriate use of the pain scale, Ms. Klann's primary cause of pain, the results of her recent test(s), the treatment plan, treatment alternatives, the risks and possible complications of proposed treatment, going over the informed consent, the opioid analgesic risks and possible complications, the results, interpretation and significance of  her recent diagnostic interventional treatment(s), the appropriate use of her medications, realistic expectations and the goals of pain management (increased in functionality).  Provider-requested follow-up: Return for Left L3,4,5, S1 RFA.  Future Appointments  Date  Time Provider [place]  03/24/2019  8:30 AM Gillis Santa, MD [facility] None    Primary Care Physician: Adin Hector, MD Location: [facility] Outpatient Pain Management Facility Note by: Gillis Santa, M.D Date: 02/10/2019; Time: 9:20 AM  Patient Instructions   Hydrocodone with acetaminophen, which can be filled 02/22/2019, to last until 04/23/2019 has been escribed to your pharmacy.  GENERAL RISKS AND COMPLICATIONS  What are the risk, side effects and possible complications? Generally speaking, most procedures are safe.  However, with any procedure there are risks, side effects, and the possibility of complications.  The risks and complications are dependent upon the sites that are lesioned, or the type of nerve block to be performed.  The closer the procedure is to the spine, the more serious the risks are.  Great care is taken when placing the radio frequency needles, block needles or lesioning probes, but sometimes complications can occur. 1. Infection: Any time there is an injection through the skin, there is a risk of infection.  This is why sterile conditions are used for these blocks.  There are four possible types of infection. 1. Localized skin infection. 2. Central Nervous System Infection-This can be in the form of Meningitis, which can be deadly. 3. Epidural Infections-This can be in the form of an epidural abscess, which can cause pressure inside of the spine, causing compression of the spinal cord with subsequent paralysis. This would require an emergency surgery to decompress, and there are no guarantees that the patient would recover from the paralysis. 4. Discitis-This is an infection of the intervertebral discs.  It occurs in about 1% of discography procedures.  It is difficult to treat and it may lead to surgery.        2. Pain: the needles have to go through skin and soft tissues, will cause soreness.       3. Damage to internal structures:  The nerves to be lesioned  may be near blood vessels or    other nerves which can be potentially damaged.       4. Bleeding: Bleeding is more common if the patient is taking blood thinners such as  aspirin, Coumadin, Ticiid, Plavix, etc., or if he/she have some genetic predisposition  such as hemophilia. Bleeding into the spinal canal can cause compression of the spinal  cord with subsequent paralysis.  This would require an emergency surgery to  decompress and there are no guarantees that the patient would recover from the  paralysis.       5. Pneumothorax:  Puncturing of a lung is a possibility, every time a needle is  introduced in  the area of the chest or upper back.  Pneumothorax refers to free air around the  collapsed lung(s), inside of the thoracic cavity (chest cavity).  Another two possible  complications related to a similar event would include: Hemothorax and Chylothorax.   These are variations of the Pneumothorax, where instead of air around the collapsed  lung(s), you may have blood or chyle, respectively.       6. Spinal headaches: They may occur with any procedures in the area of the spine.       7. Persistent CSF (Cerebro-Spinal Fluid) leakage: This is a rare problem, but may occur  with prolonged intrathecal or epidural catheters either due to the formation of a fistulous  track or a dural tear.       8. Nerve damage: By working so close to the spinal cord, there is always a possibility of  nerve damage, which could be as serious as a permanent spinal cord injury with  paralysis.       9. Death:  Although rare, severe deadly allergic reactions known as "Anaphylactic  reaction" can occur to any of the medications used.      10. Worsening of the symptoms:  We can always make thing worse.  What are the chances of something like this happening? Chances of any of this occuring are extremely low.  By statistics, you have more of a chance of getting killed in a motor vehicle accident: while driving to the hospital than  any of the above occurring .  Nevertheless, you should be aware that they are possibilities.  In general, it is similar to taking a shower.  Everybody knows that you can slip, hit your head and get killed.  Does that mean that you should not shower again?  Nevertheless always keep in mind that statistics do not mean anything if you happen to be on the wrong side of them.  Even if a procedure has a 1 (one) in a 1,000,000 (million) chance of going wrong, it you happen to be that one..Also, keep in mind that by statistics, you have more of a chance of having something go wrong when taking medications.  Who should not have this procedure? If you are on a blood thinning medication (e.g. Coumadin, Plavix, see list of "Blood Thinners"), or if you have an active infection going on, you should not have the procedure.  If you are taking any blood thinners, please inform your physician.  How should I prepare for this procedure?  Do not eat or drink anything at least six hours prior to the procedure.  Bring a driver with you .  It cannot be a taxi.  Come accompanied by an adult that can drive you back, and that is strong enough to help you if your legs get weak or numb from the local anesthetic.  Take all of your medicines the morning of the procedure with just enough water to swallow them.  If you have diabetes, make sure that you are scheduled to have your procedure done first thing in the morning, whenever possible.  If you have diabetes, take only half of your insulin dose and notify our nurse that you have done so as soon as you arrive at the clinic.  If you are diabetic, but only take blood sugar pills (oral hypoglycemic), then do not take them on the morning of your procedure.  You may take them after you have had the procedure.  Do not take  aspirin or any aspirin-containing medications, at least eleven (11) days prior to the procedure.  They may prolong bleeding.  Wear loose fitting clothing that  may be easy to take off and that you would not mind if it got stained with Betadine or blood.  Do not wear any jewelry or perfume  Remove any nail coloring.  It will interfere with some of our monitoring equipment.  NOTE: Remember that this is not meant to be interpreted as a complete list of all possible complications.  Unforeseen problems may occur.  BLOOD THINNERS The following drugs contain aspirin or other products, which can cause increased bleeding during surgery and should not be taken for 2 weeks prior to and 1 week after surgery.  If you should need take something for relief of minor pain, you may take acetaminophen which is found in Tylenol,m Datril, Anacin-3 and Panadol. It is not blood thinner. The products listed below are.  Do not take any of the products listed below in addition to any listed on your instruction sheet.  A.P.C or A.P.C with Codeine Codeine Phosphate Capsules #3 Ibuprofen Ridaura  ABC compound Congesprin Imuran rimadil  Advil Cope Indocin Robaxisal  Alka-Seltzer Effervescent Pain Reliever and Antacid Coricidin or Coricidin-D  Indomethacin Rufen  Alka-Seltzer plus Cold Medicine Cosprin Ketoprofen S-A-C Tablets  Anacin Analgesic Tablets or Capsules Coumadin Korlgesic Salflex  Anacin Extra Strength Analgesic tablets or capsules CP-2 Tablets Lanoril Salicylate  Anaprox Cuprimine Capsules Levenox Salocol  Anexsia-D Dalteparin Magan Salsalate  Anodynos Darvon compound Magnesium Salicylate Sine-off  Ansaid Dasin Capsules Magsal Sodium Salicylate  Anturane Depen Capsules Marnal Soma  APF Arthritis pain formula Dewitt's Pills Measurin Stanback  Argesic Dia-Gesic Meclofenamic Sulfinpyrazone  Arthritis Bayer Timed Release Aspirin Diclofenac Meclomen Sulindac  Arthritis pain formula Anacin Dicumarol Medipren Supac  Analgesic (Safety coated) Arthralgen Diffunasal Mefanamic Suprofen  Arthritis Strength Bufferin Dihydrocodeine Mepro Compound Suprol  Arthropan liquid  Dopirydamole Methcarbomol with Aspirin Synalgos  ASA tablets/Enseals Disalcid Micrainin Tagament  Ascriptin Doan's Midol Talwin  Ascriptin A/D Dolene Mobidin Tanderil  Ascriptin Extra Strength Dolobid Moblgesic Ticlid  Ascriptin with Codeine Doloprin or Doloprin with Codeine Momentum Tolectin  Asperbuf Duoprin Mono-gesic Trendar  Aspergum Duradyne Motrin or Motrin IB Triminicin  Aspirin plain, buffered or enteric coated Durasal Myochrisine Trigesic  Aspirin Suppositories Easprin Nalfon Trillsate  Aspirin with Codeine Ecotrin Regular or Extra Strength Naprosyn Uracel  Atromid-S Efficin Naproxen Ursinus  Auranofin Capsules Elmiron Neocylate Vanquish  Axotal Emagrin Norgesic Verin  Azathioprine Empirin or Empirin with Codeine Normiflo Vitamin E  Azolid Emprazil Nuprin Voltaren  Bayer Aspirin plain, buffered or children's or timed BC Tablets or powders Encaprin Orgaran Warfarin Sodium  Buff-a-Comp Enoxaparin Orudis Zorpin  Buff-a-Comp with Codeine Equegesic Os-Cal-Gesic   Buffaprin Excedrin plain, buffered or Extra Strength Oxalid   Bufferin Arthritis Strength Feldene Oxphenbutazone   Bufferin plain or Extra Strength Feldene Capsules Oxycodone with Aspirin   Bufferin with Codeine Fenoprofen Fenoprofen Pabalate or Pabalate-SF   Buffets II Flogesic Panagesic   Buffinol plain or Extra Strength Florinal or Florinal with Codeine Panwarfarin   Buf-Tabs Flurbiprofen Penicillamine   Butalbital Compound Four-way cold tablets Penicillin   Butazolidin Fragmin Pepto-Bismol   Carbenicillin Geminisyn Percodan   Carna Arthritis Reliever Geopen Persantine   Carprofen Gold's salt Persistin   Chloramphenicol Goody's Phenylbutazone   Chloromycetin Haltrain Piroxlcam   Clmetidine heparin Plaquenil   Cllnoril Hyco-pap Ponstel   Clofibrate Hydroxy chloroquine Propoxyphen         Before stopping any of these  medications, be sure to consult the physician who ordered them.  Some, such as Coumadin (Warfarin)  are ordered to prevent or treat serious conditions such as "deep thrombosis", "pumonary embolisms", and other heart problems.  The amount of time that you may need off of the medication may also vary with the medication and the reason for which you were taking it.  If you are taking any of these medications, please make sure you notify your pain physician before you undergo any procedures.   Moderate Conscious Sedation, Adult Sedation is the use of medicines to promote relaxation and relieve discomfort and anxiety. Moderate conscious sedation is a type of sedation. Under moderate conscious sedation, you are less alert than normal, but you are still able to respond to instructions, touch, or both. Moderate conscious sedation is used during short medical and dental procedures. It is milder than deep sedation, which is a type of sedation under which you cannot be easily woken up. It is also milder than general anesthesia, which is the use of medicines to make you unconscious. Moderate conscious sedation allows you to return to your regular activities sooner. Tell a health care provider about:  Any allergies you have.  All medicines you are taking, including vitamins, herbs, eye drops, creams, and over-the-counter medicines.  Use of steroids (by mouth or creams).  Any problems you or family members have had with sedatives and anesthetic medicines.  Any blood disorders you have.  Any surgeries you have had.  Any medical conditions you have, such as sleep apnea.  Whether you are pregnant or may be pregnant.  Any use of cigarettes, alcohol, marijuana, or street drugs. What are the risks? Generally, this is a safe procedure. However, problems may occur, including:  Getting too much medicine (oversedation).  Nausea.  Allergic reaction to medicines.  Trouble breathing. If this happens, a breathing tube may be used to help with breathing. It will be removed when you are awake and breathing on  your own.  Heart trouble.  Lung trouble. What happens before the procedure? Staying hydrated Follow instructions from your health care provider about hydration, which may include:  Up to 2 hours before the procedure - you may continue to drink clear liquids, such as water, clear fruit juice, black coffee, and plain tea. Eating and drinking restrictions Follow instructions from your health care provider about eating and drinking, which may include:  8 hours before the procedure - stop eating heavy meals or foods such as meat, fried foods, or fatty foods.  6 hours before the procedure - stop eating light meals or foods, such as toast or cereal.  6 hours before the procedure - stop drinking milk or drinks that contain milk.  2 hours before the procedure - stop drinking clear liquids. Medicine Ask your health care provider about:  Changing or stopping your regular medicines. This is especially important if you are taking diabetes medicines or blood thinners.  Taking medicines such as aspirin and ibuprofen. These medicines can thin your blood. Do not take these medicines before your procedure if your health care provider instructs you not to.  Tests and exams  You will have a physical exam.  You may have blood tests done to show: ? How well your kidneys and liver are working. ? How well your blood can clot. General instructions  Plan to have someone take you home from the hospital or clinic.  If you will be going home right after the procedure, plan to have  someone with you for 24 hours. What happens during the procedure?  An IV tube will be inserted into one of your veins.  Medicine to help you relax (sedative) will be given through the IV tube.  The medical or dental procedure will be performed. What happens after the procedure?  Your blood pressure, heart rate, breathing rate, and blood oxygen level will be monitored often until the medicines you were given have worn  off.  Do not drive for 24 hours. This information is not intended to replace advice given to you by your health care provider. Make sure you discuss any questions you have with your health care provider. Document Released: 09/11/2001 Document Revised: 05/22/2016 Document Reviewed: 04/07/2016 Elsevier Interactive Patient Education  2019 [employer].   Radiofrequency Lesioning Radiofrequency lesioning is a procedure that is performed to relieve pain. The procedure is often used for back, neck, or arm pain. Radiofrequency lesioning involves the use of a machine that creates radio waves to make heat. During the procedure, the heat is applied to the nerve that carries the pain signal. The heat damages the nerve and interferes with the pain signal. Pain relief usually starts about 2 weeks after the procedure and lasts for 6 months to 1 year. Tell a health care provider about:  Any allergies you have.  All medicines you are taking, including vitamins, herbs, eye drops, creams, and over-the-counter medicines.  Any problems you or family members have had with anesthetic medicines.  Any blood disorders you have.  Any surgeries you have had.  Any medical conditions you have.  Whether you are pregnant or may be pregnant. What are the risks? Generally, this is a safe procedure. However, problems may occur, including:  Pain or soreness at the injection site.  Infection at the injection site.  Damage to nerves or blood vessels. What happens before the procedure?  Ask your health care provider about: ? Changing or stopping your regular medicines. This is especially important if you are taking diabetes medicines or blood thinners. ? Taking medicines such as aspirin and ibuprofen. These medicines can thin your blood. Do not take these medicines before your procedure if your health care provider instructs you not to.  Follow instructions from your health care provider about eating or drinking  restrictions.  Plan to have someone take you home after the procedure.  If you go home right after the procedure, plan to have someone with you for 24 hours. What happens during the procedure?  You will be given one or more of the following: ? A medicine to help you relax (sedative). ? A medicine to numb the area (local anesthetic).  You will be awake during the procedure. You will need to be able to talk with the health care provider during the procedure.  With the help of a type of X-ray (fluoroscopy), the health care provider will insert a radiofrequency needle into the area to be treated.  Next, a wire that carries the radio waves (electrode) will be put through the radiofrequency needle. An electrical pulse will be sent through the electrode to verify the correct nerve. You will feel a tingling sensation, and you may have muscle twitching.  Then, the tissue that is around the needle tip will be heated by an electric current that is passed using the radiofrequency machine. This will numb the nerves.  A bandage (dressing) will be put on the insertion area after the procedure is done. The procedure may vary among health care providers  and hospitals. What happens after the procedure?  Your blood pressure, heart rate, breathing rate, and blood oxygen level will be monitored often until the medicines you were given have worn off.  Return to your normal activities as directed by your health care provider. This information is not intended to replace advice given to you by your health care provider. Make sure you discuss any questions you have with your health care provider. Document Released: 08/15/2011 Document Revised: 05/24/2016 Document Reviewed: 01/24/2015 Elsevier Interactive Patient Education  2019 [employer].

## 2019-02-10 NOTE — Progress Notes (Signed)
Nursing Pain Medication Assessment:  Safety precautions to be maintained throughout the outpatient stay will include: orient to surroundings, keep bed in low position, maintain call bell within reach at all times, provide assistance with transfer out of bed and ambulation.  Medication Inspection Compliance: Pill count conducted under aseptic conditions, in front of the patient. Neither the pills nor the bottle was removed from the patient's sight at any time. Once count was completed pills were immediately returned to the patient in their original bottle.  Medication: Hydrocodone/APAP Pill/Patch Count: 45 of 90 pills remain Pill/Patch Appearance: Markings consistent with prescribed medication Bottle Appearance: Standard pharmacy container. Clearly labeled. Filled Date: 01 / 24 / 2020 Last Medication intake:  Today

## 2019-02-12 ENCOUNTER — Other Ambulatory Visit: Payer: Self-pay | Admitting: Gastroenterology

## 2019-02-12 DIAGNOSIS — R1310 Dysphagia, unspecified: Secondary | ICD-10-CM

## 2019-02-17 ENCOUNTER — Ambulatory Visit
Admission: RE | Admit: 2019-02-17 | Discharge: 2019-02-17 | Disposition: A | Discharge status: Home / Self Care | Payer: BC Managed Care – PPO | Source: Ambulatory Visit | Attending: Gastroenterology | Admitting: Gastroenterology

## 2019-02-17 DIAGNOSIS — R1310 Dysphagia, unspecified: Secondary | ICD-10-CM

## 2019-02-25 ENCOUNTER — Ambulatory Visit: Payer: BC Managed Care – PPO | Admitting: Student in an Organized Health Care Education/Training Program

## 2019-03-16 ENCOUNTER — Encounter: Payer: Self-pay | Admitting: *Deleted

## 2019-03-17 ENCOUNTER — Encounter
Admission: RE | Disposition: A | Discharge status: Home / Self Care | Payer: Self-pay | Source: Home / Self Care | Attending: Gastroenterology

## 2019-03-17 ENCOUNTER — Other Ambulatory Visit: Payer: Self-pay

## 2019-03-17 ENCOUNTER — Ambulatory Visit: Payer: BC Managed Care – PPO | Admitting: Anesthesiology

## 2019-03-17 ENCOUNTER — Ambulatory Visit
Admission: RE | Admit: 2019-03-17 | Discharge: 2019-03-17 | Disposition: A | Discharge status: Home / Self Care | Payer: BC Managed Care – PPO | Attending: Gastroenterology | Admitting: Gastroenterology

## 2019-03-17 ENCOUNTER — Encounter: Payer: Self-pay | Admitting: *Deleted

## 2019-03-17 DIAGNOSIS — M5136 Other intervertebral disc degeneration, lumbar region: Secondary | ICD-10-CM | POA: Diagnosis not present

## 2019-03-17 DIAGNOSIS — K210 Gastro-esophageal reflux disease with esophagitis: Secondary | ICD-10-CM | POA: Insufficient documentation

## 2019-03-17 DIAGNOSIS — K219 Gastro-esophageal reflux disease without esophagitis: Secondary | ICD-10-CM | POA: Diagnosis present

## 2019-03-17 DIAGNOSIS — Z79899 Other long term (current) drug therapy: Secondary | ICD-10-CM | POA: Diagnosis not present

## 2019-03-17 DIAGNOSIS — K2210 Ulcer of esophagus without bleeding: Secondary | ICD-10-CM | POA: Diagnosis not present

## 2019-03-17 DIAGNOSIS — K31819 Angiodysplasia of stomach and duodenum without bleeding: Secondary | ICD-10-CM | POA: Insufficient documentation

## 2019-03-17 DIAGNOSIS — E7800 Pure hypercholesterolemia, unspecified: Secondary | ICD-10-CM | POA: Insufficient documentation

## 2019-03-17 DIAGNOSIS — F419 Anxiety disorder, unspecified: Secondary | ICD-10-CM | POA: Insufficient documentation

## 2019-03-17 DIAGNOSIS — K449 Diaphragmatic hernia without obstruction or gangrene: Secondary | ICD-10-CM | POA: Insufficient documentation

## 2019-03-17 DIAGNOSIS — F329 Major depressive disorder, single episode, unspecified: Secondary | ICD-10-CM | POA: Insufficient documentation

## 2019-03-17 HISTORY — PX: ESOPHAGOGASTRODUODENOSCOPY (EGD) WITH PROPOFOL: SHX5813

## 2019-03-17 SURGERY — ESOPHAGOGASTRODUODENOSCOPY (EGD) WITH PROPOFOL
Anesthesia: General

## 2019-03-17 MED ORDER — PROPOFOL 500 MG/50ML IV EMUL
INTRAVENOUS | Status: DC | PRN
  Administered 2019-03-17: 100 ug/kg/min via INTRAVENOUS

## 2019-03-17 MED ORDER — GLYCOPYRROLATE 0.2 MG/ML IJ SOLN
INTRAMUSCULAR | Status: DC | PRN
  Administered 2019-03-17: 0.2 mg via INTRAVENOUS

## 2019-03-17 MED ORDER — ONDANSETRON HCL 4 MG/2ML IJ SOLN
INTRAMUSCULAR | Status: DC | PRN
  Administered 2019-03-17: 4 mg via INTRAVENOUS

## 2019-03-17 MED ORDER — ONDANSETRON HCL 4 MG/2ML IJ SOLN
INTRAMUSCULAR | Status: AC
  Filled 2019-03-17: qty 2

## 2019-03-17 MED ORDER — GLYCOPYRROLATE 0.2 MG/ML IJ SOLN
INTRAMUSCULAR | Status: AC
  Filled 2019-03-17: qty 1

## 2019-03-17 MED ORDER — LIDOCAINE HCL (CARDIAC) PF 100 MG/5ML IV SOSY
PREFILLED_SYRINGE | INTRAVENOUS | Status: DC | PRN
  Administered 2019-03-17: 80 mg via INTRAVENOUS

## 2019-03-17 MED ORDER — MIDAZOLAM HCL 2 MG/2ML IJ SOLN
INTRAMUSCULAR | Status: DC | PRN
  Administered 2019-03-17: 1 mg via INTRAVENOUS

## 2019-03-17 MED ORDER — LIDOCAINE HCL (PF) 2 % IJ SOLN
INTRAMUSCULAR | Status: AC
  Filled 2019-03-17: qty 10

## 2019-03-17 MED ORDER — MIDAZOLAM HCL 2 MG/2ML IJ SOLN
INTRAMUSCULAR | Status: AC
  Filled 2019-03-17: qty 2

## 2019-03-17 MED ORDER — PHENYLEPHRINE HCL 10 MG/ML IJ SOLN
INTRAMUSCULAR | Status: DC | PRN
  Administered 2019-03-17 (×2): 100 ug via INTRAVENOUS
  Administered 2019-03-17: 50 ug via INTRAVENOUS
  Administered 2019-03-17: 150 ug via INTRAVENOUS

## 2019-03-17 MED ORDER — FENTANYL CITRATE (PF) 100 MCG/2ML IJ SOLN
INTRAMUSCULAR | Status: AC
  Filled 2019-03-17: qty 2

## 2019-03-17 MED ORDER — FENTANYL CITRATE (PF) 100 MCG/2ML IJ SOLN
INTRAMUSCULAR | Status: DC | PRN
  Administered 2019-03-17 (×2): 25 ug via INTRAVENOUS

## 2019-03-17 MED ORDER — SODIUM CHLORIDE 0.9 % IV SOLN
INTRAVENOUS | Status: DC
  Administered 2019-03-17: 1000 mL via INTRAVENOUS

## 2019-03-17 MED ORDER — PROPOFOL 10 MG/ML IV BOLUS
INTRAVENOUS | Status: AC
  Filled 2019-03-17: qty 40

## 2019-03-17 MED ORDER — PROPOFOL 10 MG/ML IV BOLUS
INTRAVENOUS | Status: DC | PRN
  Administered 2019-03-17: 30 mg via INTRAVENOUS
  Administered 2019-03-17: 50 mg via INTRAVENOUS

## 2019-03-17 NOTE — Anesthesia Preprocedure Evaluation (Signed)
Anesthesia Evaluation  Patient identified by MRN, date of birth, ID band Patient awake    Reviewed: Allergy & Precautions, NPO status , Patient's Chart, lab work & pertinent test results  History of Anesthesia Complications Negative for: history of anesthetic complications  Airway Mallampati: II  TM Distance: >3 FB Neck ROM: Full    Dental no notable dental hx.    Pulmonary neg sleep apnea, neg COPD, Current Smoker,    breath sounds clear to auscultation- rhonchi (-) wheezing      Cardiovascular Exercise Tolerance: Good (-) hypertension(-) CAD, (-) Past MI, (-) Cardiac Stents and (-) CABG  Rhythm:Regular Rate:Normal - Systolic murmurs and - Diastolic murmurs    Neuro/Psych neg Seizures PSYCHIATRIC DISORDERS Anxiety Depression negative neurological ROS     GI/Hepatic Neg liver ROS, GERD  ,  Endo/Other  negative endocrine ROSneg diabetes  Renal/GU negative Renal ROS     Musculoskeletal  (+) Arthritis ,   Abdominal (+) - obese,   Peds  Hematology negative hematology ROS (+)   Anesthesia Other Findings Past Medical History: No date: Anxiety No date: Back pain No date: DDD (degenerative disc disease), lumbar No date: Depression No date: GERD (gastroesophageal reflux disease) No date: High cholesterol No date: Lumbar radiculitis 1982: Pneumothorax   Reproductive/Obstetrics                             Anesthesia Physical Anesthesia Plan  ASA: II  Anesthesia Plan: General   Post-op Pain Management:    Induction: Intravenous  PONV Risk Score and Plan: 1 and Propofol infusion  Airway Management Planned: Natural Airway  Additional Equipment:   Intra-op Plan:   Post-operative Plan:   Informed Consent: I have reviewed the patients History and Physical, chart, labs and discussed the procedure including the risks, benefits and alternatives for the proposed anesthesia with the  patient or authorized representative who has indicated his/her understanding and acceptance.     Dental advisory given  Plan Discussed with: CRNA and Anesthesiologist  Anesthesia Plan Comments:         Anesthesia Quick Evaluation

## 2019-03-17 NOTE — Anesthesia Post-op Follow-up Note (Signed)
Anesthesia QCD[facility] form completed.        

## 2019-03-17 NOTE — Anesthesia Postprocedure Evaluation (Signed)
Anesthesia Post Note  Patient: Vanessa Greer  Procedure(s) Performed: ESOPHAGOGASTRODUODENOSCOPY (EGD) WITH PROPOFOL (N/A )  Patient location during evaluation: Endoscopy Anesthesia Type: General Level of consciousness: awake and alert and oriented Pain management: pain level controlled Vital Signs Assessment: post-procedure vital signs reviewed and stable Respiratory status: spontaneous breathing, nonlabored ventilation and respiratory function stable Cardiovascular status: blood pressure returned to baseline and stable Postop Assessment: no signs of nausea or vomiting Anesthetic complications: no     Last Vitals:  Vitals:   03/17/19 1047 03/17/19 1057  BP: (!) 114/52 (!) 122/55  Pulse: 79 75  Resp: 12 10  Temp:    SpO2: 92% 95%    Last Pain:  Vitals:   03/17/19 1057  TempSrc:   PainSc: 0-No pain                 Lemond Griffee

## 2019-03-17 NOTE — H&P (Signed)
Outpatient short stay form Pre-procedure 03/17/2019 10:01 AM Christena Deem MD  Primary Physician: Daniel Nones, MD  Reason for visit: EGD  History of present illness: Patient is a 58 year old female presenting today for an EGD in regards to an increase of reflux symptoms.  Has been on a single dose PPI daily and was recently changed to a twice a day dose.  Has helped her some still has issues with night awakening with reflux symptoms.  Also has some epigastric pain.  She had a barium swallow which was negative with the exception of a small sliding hiatal hernia.  Takes no aspirin or blood thinning agent.    Current Facility-Administered Medications:  .  0.9 %  sodium chloride infusion, , Intravenous, Continuous, Christena Deem, MD, Last Rate: 20 mL/hr at 03/17/19 0958, 1,000 mL at 03/17/19 4481  Medications Prior to Admission  Medication Sig Dispense Refill Last Dose  . HYDROcodone-acetaminophen (NORCO) 10-325 MG tablet Take 1 tablet by mouth every 8 (eight) hours as needed for up to 30 days for severe pain. 90 tablet 0 03/17/2019 at Unknown time  . traZODone (DESYREL) 50 MG tablet Take 50 mg by mouth at bedtime as needed for sleep.     . Cholecalciferol (VITAMIN D3) 3000 units TABS Take 1,000 Units by mouth daily.   Taking  . cyclobenzaprine (FLEXERIL) 10 MG tablet Take 10 mg by mouth 2 (two) times daily as needed for muscle spasms.   Taking  . [START ON 03/24/2019] HYDROcodone-acetaminophen (NORCO) 10-325 MG tablet Take 1 tablet by mouth every 8 (eight) hours as needed for up to 30 days for severe pain. 90 tablet 0   . LYRICA 150 MG capsule 150 mg 2 (two) times daily.   0 Taking  . Multiple Vitamin (MULTIVITAMIN WITH MINERALS) TABS tablet Take 1 tablet by mouth daily.   Taking  . Omega-3 Fatty Acids (OMEGA 3 500 PO) Take by mouth daily.   Taking  . pantoprazole (PROTONIX) 40 MG tablet Take 40 mg by mouth 2 (two) times daily.   0 Taking  . sertraline (ZOLOFT) 50 MG tablet 100 mg daily.    0 Taking  . simvastatin (ZOCOR) 40 MG tablet 40 mg daily at 6 PM.   0 Taking  . Turmeric 500 MG CAPS Take by mouth daily.   Taking     Allergies  Allergen Reactions  . Bupropion Anxiety     Past Medical History:  Diagnosis Date  . Anxiety   . Back pain   . DDD (degenerative disc disease), lumbar   . Depression   . GERD (gastroesophageal reflux disease)   . High cholesterol   . Lumbar radiculitis   . Pneumothorax 1982    Review of systems:      Physical Exam    Heart and lungs: Regular rate and rhythm without rub or gallop, lungs are bilaterally clear.    HEENT: Normocephalic atraumatic eyes are anicteric    Other:    Pertinant exam for procedure: Soft nontender nondistended bowel sounds positive normoactive    Planned proceedures: EGD and indicated procedures. I have discussed the risks benefits and complications of procedures to include not limited to bleeding, infection, perforation and the risk of sedation and the patient wishes to proceed.    Christena Deem, MD Gastroenterology 03/17/2019  10:01 AM

## 2019-03-17 NOTE — Op Note (Signed)
[facility] Gastroenterology Patient Name: Vanessa Greer Procedure Date: 03/17/2019 9:56 AM MRN: [id number] Account #: [id number] Date of Birth: [birth year] Admit Type: Outpatient Age: 58 Room: [facility] ENDO ROOM 3 Gender: Female Note Status: Finalized Procedure:            Upper GI endoscopy Indications:          Dysphagia, Follow-up of gastro-esophageal reflux disease Providers:            Christena Deem, MD Referring MD:         Daniel Nones, MD (Referring MD) Medicines:            Monitored Anesthesia Care Complications:        No immediate complications. Procedure:            Pre-Anesthesia Assessment:                       - ASA Grade Assessment: II - A patient with mild                        systemic disease.                       After obtaining informed consent, the endoscope was                        passed under direct vision. Throughout the procedure,                        the patient's blood pressure, pulse, and oxygen                        saturations were monitored continuously. The Endoscope                        was introduced through the mouth, and advanced to the                        fourth part of duodenum. The upper GI endoscopy was                        accomplished without difficulty. The patient tolerated                        the procedure well. Findings:      LA Grade B (one or more mucosal breaks greater than 5 mm, not extending       between the tops of two mucosal folds) esophagitis with no bleeding was       found. Biopsies were taken with a cold forceps for histology.      A small hiatal hernia was found. The Z-line was a variable distance from       incisors; the hiatal hernia was sliding.      A few small no bleeding, no stigmata, deep to the mucosa angioectasias       were found in the gastric body.      The cardia and gastric fundus were normal on retroflexion otherwise.      A few small angioectasias without bleeding,  located deep to the mucosa,       were found in the duodenal bulb.      Diffuse minimal inflammation characterized  by erythema was found in the       gastric body. Biopsies were taken with a cold forceps for histology.      The cardia and gastric fundus were normal on retroflexion.      Patchy mild mucosal variance characterized by nodularity was found in       the duodenal bulb, consistant in appearance to gastric metaplasia. Impression:           - LA Grade B erosive esophagitis. Biopsied.                       - Small hiatal hernia.                       - A few non-bleeding angioectasias in the stomach.                       - A few non-bleeding angioectasias in the duodenum.                       - Gastritis. Biopsied.                       - Mucosal variant in the duodenum. Recommendation:       - Discharge patient to home.                       - Await pathology results.                       - Telephone GI clinic for pathology results in 1 week.                       - Use Protonix (pantoprazole) 40 mg PO BID. Procedure Code(s):    --- Professional ---                       [phone], Esophagogastroduodenoscopy, flexible, transoral;                        with biopsy, single or multiple Diagnosis Code(s):    --- Professional ---                       K20.8, Other esophagitis                       K44.9, Diaphragmatic hernia without obstruction or                        gangrene                       K31.819, Angiodysplasia of stomach and duodenum without                        bleeding                       K29.70, Gastritis, unspecified, without bleeding                       K31.89, Other diseases of stomach and duodenum  R13.10, Dysphagia, unspecified                       K21.9, Gastro-esophageal reflux disease without                        esophagitis CPT copyright 2018 American Medical Association. All rights reserved. The codes documented in this  report are preliminary and upon coder review may  be revised to meet current compliance requirements. Christena Deem, MD 03/17/2019 10:38:42 AM This report has been signed electronically. Number of Addenda: 0 Note Initiated On: 03/17/2019 9:56 AM      [facility]

## 2019-03-17 NOTE — Transfer of Care (Signed)
Immediate Anesthesia Transfer of Care Note  Patient: Vanessa Greer  Procedure(s) Performed: ESOPHAGOGASTRODUODENOSCOPY (EGD) WITH PROPOFOL (N/A )  Patient Location: Endoscopy Unit  Anesthesia Type:General  Level of Consciousness: awake  Airway & Oxygen Therapy: Patient Spontanous Breathing and Patient connected to nasal cannula oxygen  Post-op Assessment: Report given to RN and Post -op Vital signs reviewed and stable  Post vital signs: stable  Last Vitals:  Vitals Value Taken Time  BP 97/48 03/17/2019 10:37 AM  Temp 36.1 C 03/17/2019 10:37 AM  Pulse 71 03/17/2019 10:40 AM  Resp 11 03/17/2019 10:40 AM  SpO2 96 % 03/17/2019 10:40 AM  Vitals shown include unvalidated device data.  Last Pain:  Vitals:   03/17/19 1037  TempSrc: Tympanic  PainSc:          Complications: No apparent anesthesia complications

## 2019-03-18 ENCOUNTER — Encounter: Payer: Self-pay | Admitting: Gastroenterology

## 2019-03-18 LAB — SURGICAL PATHOLOGY

## 2019-03-24 ENCOUNTER — Ambulatory Visit: Payer: BC Managed Care – PPO | Admitting: Student in an Organized Health Care Education/Training Program

## 2019-04-20 ENCOUNTER — Ambulatory Visit
Payer: BC Managed Care – PPO | Attending: Student in an Organized Health Care Education/Training Program | Admitting: Student in an Organized Health Care Education/Training Program

## 2019-04-20 ENCOUNTER — Other Ambulatory Visit: Payer: Self-pay

## 2019-04-20 DIAGNOSIS — M5136 Other intervertebral disc degeneration, lumbar region: Secondary | ICD-10-CM

## 2019-04-20 DIAGNOSIS — M47816 Spondylosis without myelopathy or radiculopathy, lumbar region: Secondary | ICD-10-CM | POA: Diagnosis not present

## 2019-04-20 DIAGNOSIS — G894 Chronic pain syndrome: Secondary | ICD-10-CM

## 2019-04-20 MED ORDER — HYDROCODONE-ACETAMINOPHEN 10-325 MG PO TABS: 1.0000 | ORAL_TABLET | Freq: Three times a day (TID) | ORAL | 0 refills | Status: DC | PRN

## 2019-04-20 NOTE — Progress Notes (Signed)
Pain Management Virtual Encounter Note - Virtual Visit via Video Conference Telehealth (real-time audio visits between healthcare provider and patient).  Patient's Phone No. & Preferred Pharmacy:  [phone] (home); [phone] (mobile); (Preferred) [phone] [email or web address]  [facility] - [place], [place] - [address] [place] [address] Phone: [phone] Fax: [phone]  [id number] [place], [place] - [address] AT [facility] OF [address] [place] [address] [place] [address] Phone: [phone] Fax: [phone]   Pre-screening note:  Our staff contacted Vanessa Greer and offered her an "in person", "face-to-face" appointment versus a telephone encounter. She indicated preferring the telephone encounter, at this time.  Reason for Virtual Visit: COVID-19*  Social distancing based on CDC and AMA recommendations.   I contacted [name] on 04/20/2019 at 10:15 AM via video conference and clearly identified myself as Edward Jolly, MD. I verified that I was speaking with the correct person using two identifiers (Name and date of birth: [birth year]).  Advanced Informed Consent I sought verbal advanced consent from [name] for virtual visit interactions. I informed Vanessa Greer of possible security and privacy concerns, risks, and limitations associated with providing "not-in-person" medical evaluation and management services. I also informed Vanessa Greer of the availability of "in-person" appointments. Finally, I informed her that there would be a charge for the virtual visit and that she could be  personally, fully or partially, financially responsible for it. Vanessa Greer expressed understanding and agreed to proceed.   Historic Elements   Vanessa Greer is a 58 y.o. year old, female patient evaluated today after her last encounter by our practice on 02/10/2019. Ms. Chrismer  has a past medical  history of Anxiety, Back pain, DDD (degenerative disc disease), lumbar, Depression, GERD (gastroesophageal reflux disease), High cholesterol, Lumbar radiculitis, and Pneumothorax (1982). She also  has a past surgical history that includes Abdominal hysterectomy; Foot surgery; Ectopic pregnancy surgery; Colonoscopy; Laser ablation condolamata (N/A, 12/13/2017); and Esophagogastroduodenoscopy (egd) with propofol (N/A, 03/17/2019). Ms. Bachtel has a current medication list which includes the following prescription(s): vitamin d3, cyclobenzaprine, hydrocodone-acetaminophen, lyrica, multivitamin with minerals, omega-3 fatty acids, pantoprazole, sertraline, simvastatin, trazodone, hydrocodone-acetaminophen, and turmeric. She  reports that she has been smoking cigarettes. She has a 41.00 pack-year smoking history. She has never used smokeless tobacco. She reports current alcohol use. She reports that she does not use drugs. Ms. Petrenko is allergic to bupropion.   HPI  I last saw her on 02/10/2019. She is being evaluated for medication management.   No change in medical history.  Is interested in proceeding with ablation.  At previous visit on 02/10/2019, patient is status post bilateral L3, L4, L5, S1 facet medial branch nerve blocks and at that time order was placed for left L3, L4, L5, S1 RFA followed by right.  This is been postponed due to COVID-19 restrictions however patient would still like to get this done.  We will also refill hydrocodone as below.  Pharmacotherapy Assessment   03/24/2019  1   02/10/2019  Hydrocodone-Acetamin 10-325 MG  90.00 30 Bi Lat   [id number]   [facility] ([id number])   0  30.00 MME  Comm Ins   [place]     Monitoring: Pharmacotherapy: No side-effects or adverse reactions reported. [place] PMP: PDMP reviewed during this encounter.       Compliance: No problems identified or detected. Plan: Refer to "POC".  Review of recent tests  DG ESOPHAGUS W DOUBLE CM (HD) CLINICAL DATA:  Choking.   Pain.  EXAM: ESOPHOGRAM / BARIUM SWALLOW / BARIUM TABLET STUDY  TECHNIQUE: Combined double contrast and single contrast examination performed using effervescent crystals, thick barium liquid, and thin barium liquid. The patient was observed with fluoroscopy swallowing a 13 mm barium sulphate tablet.  FLUOROSCOPY TIME:  Fluoroscopy Time:  0 minutes 54 seconds  Radiation Exposure Index (if provided by the fluoroscopic device): 20.4 mGy  Number of Acquired Spot Images: 19  COMPARISON:  No recent prior.  FINDINGS: Cervical esophagus is widely patent. No evidence of aspiration. Thoracic esophagus is widely patent. No obstructing lesion noted. Mucosa appears normal. Peristaltic activity is normal. Very tiny sliding hiatal hernia. No reflux. Standardized barium tablet passes normally.  IMPRESSION: Very tiny sliding hiatal hernia, otherwise normal exam. No reflux. No obstructing abnormality.  Electronically Signed   By: [name]  Register   On: 02/17/2019 12:02   Admission on 03/17/2019, Discharged on 03/17/2019  Component Date Value Ref Range Status  . SURGICAL PATHOLOGY 03/17/2019    Final                   Value:Surgical Pathology CASE: [id number] PATIENT: Vanessa Greer Surgical Pathology Report     SPECIMEN SUBMITTED: A. Duodenum bulb, atypical mucosa; cbx B. Stomach; r/o h. pylorie; cbx C. 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 DUODENAL BULB; COLD BIOPSY: - ENTERIC MUCOSA WITH GASTRIC HETEROTOPIA. - NEGATIVE FOR FEATURES OF CELIAC DISEASE, DYSPLASIA, AND MALIGNANCY.  B. STOMACH; COLD BIOPSY: - GASTRIC ANTRAL AND OXYNTIC MUCOSA WITH MILD CHRONIC INACTIVE GASTRITIS AND FEATURES OF REACTIVE GASTROPATHY. - NEGATIVE FOR H. PYLORI, DYSPLASIA, AND MALIGNANCY.  Comment: The  differential diagnosis for findings in the stomach includes drugs/chemical injury (NSAIDs vs. other), bile reflux, and changes adjacent to an area of healing ulceration. Clinical correlation with endoscopic findings                          is required.  C. GASTROESOPHAGEAL JUNCTION; COLD BIOPSY: - SQUAMOCOLUMNAR MUCOSA WITH FEATURES OF REFLUX GASTROESOPHAGITIS. - FOCAL VASCULAR ECTASIA. - NO INCREASE IN INTRAEPITHELIAL EOSINOPHILS (LESS THAN 2 PER HPF). - NEGATIVE FOR INTESTINAL METAPLASIA, DYSPLASIA, AND MALIGNANCY.   GROSS DESCRIPTION: A. Labeled: C BX duodenal bulb atypical mucosa Received: Formalin Tissue fragment(s): 2 Size: 0.2-0.4 cm Description: Tan soft tissue fragments Entirely submitted in 1 cassette.  B. Labeled: C BX stomach rule out H. pylori Received: Formalin Tissue fragment(s): 3 Size: 0.2-0.5 cm Description: Tan soft tissue fragments Entirely submitted in 1 cassette.  C. Labeled: C BX GEJ Received: Formalin Tissue fragment(s): Multiple Size: Aggregate, 0.5 x 0.3 x 0.1 cm Description: Tan soft tissue fragments Entirely submitted in 1 cassette.   Final Diagnosis performed by Katherine Mantle, MD.   Electronically signed 03/18/2019 11:19:51AM The electronic signature indicates t                         hat the named Attending Pathologist has evaluated the specimen  Technical component performed at [facility], [address] [place], [place] [address] Lab: [phone] Dir: Jolene Schimke, MD, MMM  Professional component performed at [facility], [facility], [address] [place], [place], [place] [address] Lab: [phone] Dir: Georgiann Cocker. Oneita Kras, MD    Assessment  The primary  encounter diagnosis was Lumbar facet arthropathy. Diagnoses of Lumbar spondylosis, Lumbar degenerative disc disease, and Chronic pain syndrome were also pertinent to this visit.  Plan of Care  I am having Mickie BailLinda D. Vanwart start on HYDROcodone-acetaminophen. I am also having her  maintain her Lyrica, sertraline, simvastatin, pantoprazole, cyclobenzaprine, multivitamin with minerals, Turmeric, Omega-3 Fatty Acids (OMEGA 3 500 PO), Vitamin D3, traZODone, and HYDROcodone-acetaminophen.  Pharmacotherapy (Medications Ordered): Meds ordered this encounter  Medications  . HYDROcodone-acetaminophen (NORCO) 10-325 MG tablet    Sig: Take 1 tablet by mouth every 8 (eight) hours as needed for up to 30 days for severe pain.    Dispense:  90 tablet    Refill:  0    Do not place this medication, or any other prescription from our practice, on "Automatic Refill". Patient may have prescription filled one day early if pharmacy is closed on scheduled refill date.  Marland Kitchen. HYDROcodone-acetaminophen (NORCO) 10-325 MG tablet    Sig: Take 1 tablet by mouth every 8 (eight) hours as needed for up to 30 days for severe pain. Must last 30 days.    Dispense:  90 tablet    Refill:  0     STOP ACT - Not applicable. Fill one day early if pharmacy is closed on scheduled refill date.   Orders:  Orders Placed This Encounter  Procedures  . Radiofrequency,Lumbar    Standing Status:   Future    Standing Expiration Date:   10/19/2020    Scheduling Instructions:     Side(s): Left-sided     Level: L3-4, L4-5, & L5-S1 Facets ( L3, L4, L5, & S1 Medial Branch Nerves)     Sedation: With Sedation     Scheduling Timeframe: As soon as pre-approved    Order Specific Question:   Where will this procedure be performed?    Answer:   [facility]   Follow-up plan:   Return in about 8 weeks (around 06/15/2019) for Medication Management.   I discussed the assessment and treatment plan with the patient. The patient was provided an opportunity to ask questions and all were answered. The patient agreed with the plan and demonstrated an understanding of the instructions.  Patient advised to call back or seek an in-person evaluation if the symptoms or condition worsens.  Total duration of non-face-to-face  encounter: 30minutes.  Note by: Edward JollyBilal Delia Slatten, MD Date: 04/20/2019; Time: 10:15 AM  Disclaimer:  * Given the special circumstances of the COVID-19 pandemic, the federal government has announced that the Office for Civil Rights (OCR) will exercise its enforcement discretion and will not impose penalties on physicians using telehealth in the event of noncompliance with regulatory requirements under the [employer] Insurance Portability and Accountability Act (HIPAA) in connection with the good faith provision of telehealth during the COVID-19 national public health emergency. (AMA)

## 2019-05-01 ENCOUNTER — Other Ambulatory Visit: Payer: Self-pay | Admitting: Gastroenterology

## 2019-05-01 DIAGNOSIS — R1013 Epigastric pain: Secondary | ICD-10-CM

## 2019-05-01 DIAGNOSIS — R6881 Early satiety: Secondary | ICD-10-CM

## 2019-06-10 ENCOUNTER — Encounter: Payer: Self-pay | Admitting: Student in an Organized Health Care Education/Training Program

## 2019-06-11 ENCOUNTER — Ambulatory Visit
Payer: BC Managed Care – PPO | Attending: Student in an Organized Health Care Education/Training Program | Admitting: Student in an Organized Health Care Education/Training Program

## 2019-06-11 ENCOUNTER — Other Ambulatory Visit: Payer: Self-pay

## 2019-06-11 ENCOUNTER — Encounter: Payer: Self-pay | Admitting: Student in an Organized Health Care Education/Training Program

## 2019-06-11 DIAGNOSIS — M5136 Other intervertebral disc degeneration, lumbar region: Secondary | ICD-10-CM | POA: Diagnosis not present

## 2019-06-11 DIAGNOSIS — M47816 Spondylosis without myelopathy or radiculopathy, lumbar region: Secondary | ICD-10-CM | POA: Diagnosis not present

## 2019-06-11 DIAGNOSIS — G894 Chronic pain syndrome: Secondary | ICD-10-CM

## 2019-06-11 MED ORDER — HYDROCODONE-ACETAMINOPHEN 10-325 MG PO TABS: 1.0000 | ORAL_TABLET | Freq: Three times a day (TID) | ORAL | 0 refills | Status: DC | PRN

## 2019-06-11 MED ORDER — BUPRENORPHINE 7.5 MCG/HR TD PTWK: 7.5000 ug/h | MEDICATED_PATCH | TRANSDERMAL | 0 refills | Status: AC

## 2019-06-11 NOTE — Progress Notes (Signed)
Pain Management Virtual Encounter Note - Virtual Visit via Video Conference Telehealth (real-time audio visits between healthcare provider and patient).   Patient's Phone No. & Preferred Pharmacy:  [phone] (home); [phone] (mobile); (Preferred) [phone] [email or web address]  [facility] [place] - [address] [place] [id number] Phone: [phone] Fax: [phone]  [id number] [place] - [address] [facility] [address] [place] [phone] Phone: [phone] Fax: [phone]    Pre-screening note:  Our staff contacted [name] and offered her an "in person", "face-to-face" appointment versus a telephone encounter. She indicated preferring the telephone encounter, at this time.   Reason for Virtual Visit: COVID-19*  Social distancing based on CDC and AMA recommendations.   I contacted Adan SisLinda D Dardis on 06/11/2019 via video conference.      I clearly identified myself as Edward JollyBilal Aydia Maj, MD. I verified that I was speaking with the correct person using two identifiers (Name: Adan SisLinda D Plasencia, and date of birth: 07/10/1961).  Advanced Informed Consent I sought verbal advanced consent from Adan SisLinda D Schaus for virtual visit interactions. I informed [name] of possible security and privacy concerns, risks, and limitations associated with providing "not-in-person" medical evaluation and management services. I also informed [name] of the availability of "in-person" appointments. Finally, I informed her that there would be a charge for the virtual visit and that she could be  personally, fully or partially, financially responsible for it. [name] expressed understanding and agreed to proceed.   Historic Elements   Ms. Adan SisLinda D Nishiyama is a 58 y.o. year old, female patient evaluated today after her last encounter by our practice on 04/20/2019. [name]  has a  past medical history of Anxiety, Back pain, DDD (degenerative disc disease), lumbar, Depression, GERD (gastroesophageal reflux disease), High cholesterol, Lumbar radiculitis, and Pneumothorax (1982). She also  has a past surgical history that includes Abdominal hysterectomy; Foot surgery; Ectopic pregnancy surgery; Colonoscopy; Laser ablation condolamata (N/A, 12/13/2017); and Esophagogastroduodenoscopy (egd) with propofol (N/A, 03/17/2019). [name] has a current medication list which includes the following prescription(s): vitamin d3, cyclobenzaprine, hydrocodone-acetaminophen, lyrica, multivitamin with minerals, omega-3 fatty acids, pantoprazole, sertraline, simvastatin, trazodone, turmeric, and buprenorphine. She  reports that she has been smoking cigarettes. She has a 41.00 pack-year smoking history. She has never used smokeless tobacco. She reports current alcohol use. She reports that she does not use drugs. [name] is allergic to bupropion.   HPI  Today, she is being contacted for medication management.  Worsening of previously established problem.  Patient states that she is having a hard time managing her pain on her current regimen.  Patient's regimen includes hydrocodone 10 mg 3 times daily as needed which usually takes daily, Flexeril 10 mg twice daily as needed, Lyrica 150 mg twice daily along with turmeric 500 mg daily.  Patient is scheduled for a left-sided L3, L4, L5, S1 RFA July 6 which will be followed by right side.  She states that the hydrocodone is becoming less effective and feels that it only provides her pain relief for about 2 to 3 hours.  We discussed alternatives as well as opioid rotation.  We had extensive conversation about buprenorphine and how this could be effective in managing her baseline pain.  Pharmacotherapy Assessment   05/27/2019  1   04/20/2019  Hydrocodone-Acetamin 10-325 MG  90.00 30 Bi Lat   [id number]  Wal ([id number])   0  30.00 MME  Comm Ins   [place]     Monitoring: Pharmacotherapy: No side-effects or adverse reactions reported. [place] PMP: PDMP reviewed during this encounter.       Compliance: No problems identified. Effectiveness: Clinically acceptable. Plan: Refer to "POC".  Pertinent Labs   SAFETY SCREENING Profile No results found for: SARSCOV2NAA, COVIDSOURCE, STAPHAUREUS, MRSAPCR, HCVAB, HIV, PREGTESTUR Renal Function No results found for: BUN, CREATININE, BCR, GFRAA, GFRNONAA Hepatic Function No results found for: AST, ALT, ALBUMIN UDS Summary  Date Value Ref Range Status  12/16/2018 FINAL  Final    Comment:    ==================================================================== TOXASSURE SELECT 13 (MW) ==================================================================== Test                             Result       Flag       Units Drug Present and Declared for Prescription Verification   Hydrocodone                    916          EXPECTED   ng/mg creat   Hydromorphone                  272          EXPECTED   ng/mg creat   Dihydrocodeine                 308          EXPECTED   ng/mg creat   Norhydrocodone                 2268         EXPECTED   ng/mg creat    Sources of hydrocodone include scheduled prescription    medications. Hydromorphone, dihydrocodeine and norhydrocodone are    expected metabolites of hydrocodone. Hydromorphone and    dihydrocodeine are also available as scheduled prescription    medications. ==================================================================== Test                      Result    Flag   Units      Ref Range   Creatinine              25               mg/dL      >=16>=20 ==================================================================== Declared Medications:  The flagging and interpretation on this report are based on the  following declared medications.  Unexpected results may arise from  inaccuracies in the declared medications.  **Note: The testing scope of this panel includes  these medications:  Hydrocodone (Hydrocodone-Acetaminophen)  **Note: The testing scope of this panel does not include following  reported medications:  Acetaminophen (Hydrocodone-Acetaminophen)  Cyclobenzaprine  Multivitamin (MVI)  Omega-3 Fatty Acids  Pantoprazole  Pregabalin  Sertraline  Simvastatin  Turmeric  Vitamin D3 ==================================================================== For clinical consultation, please call [phone]. ====================================================================    Note: Above Lab results reviewed.  Recent imaging  DG ESOPHAGUS W DOUBLE CM (HD) CLINICAL DATA:  Choking.  Pain.  EXAM: ESOPHOGRAM / BARIUM SWALLOW / BARIUM TABLET STUDY  TECHNIQUE: Combined double contrast and single contrast examination performed using effervescent crystals, thick barium liquid, and thin barium liquid. The patient was observed with fluoroscopy swallowing a 13 mm barium sulphate tablet.  FLUOROSCOPY TIME:  Fluoroscopy Time:  0 minutes 54 seconds  Radiation Exposure Index (if  provided by the fluoroscopic device): 20.4 mGy  Number of Acquired Spot Images: 19  COMPARISON:  No recent prior.  FINDINGS: Cervical esophagus is widely patent. No evidence of aspiration. Thoracic esophagus is widely patent. No obstructing lesion noted. Mucosa appears normal. Peristaltic activity is normal. Very tiny sliding hiatal hernia. No reflux. Standardized barium tablet passes normally.  IMPRESSION: Very tiny sliding hiatal hernia, otherwise normal exam. No reflux. No obstructing abnormality.  Electronically Signed   By: Marcello Moores  Register   On: 02/17/2019 12:02  Assessment  The primary encounter diagnosis was Lumbar facet arthropathy. Diagnoses of Lumbar spondylosis, Lumbar degenerative disc disease, and Chronic pain syndrome were also pertinent to this visit.  Plan of Care  I am having Tia Masker. Gable start on Buprenorphine. I am also having her  maintain her Lyrica, sertraline, simvastatin, pantoprazole, cyclobenzaprine, multivitamin with minerals, Turmeric, Omega-3 Fatty Acids (OMEGA 3 500 PO), Vitamin D3, traZODone, and HYDROcodone-acetaminophen.  Patient is already scheduled for left L3, L4, L5, S1 radiofrequency ablation on July 6 for lumbar facet arthropathy and lumbar spondylosis.  This will be followed by the right side.  Patient is continue to endorse sacroiliac joint related pain.  We discussed SI joint related exercises and release movements.  Patient states that she will try this.  If not effective can consider diagnostic sacroiliac joint injection in the future.  In regards to medication management, patient states that her pain is not well managed on her current regimen.  We discussed buprenorphine therapy.  Prescription below for 7.5 mcg patch.  Encourage patient to utilize less hydrocodone when patches are placed.  Patient is finding benefit with buprenorphine, can consider titration of patch and weaning of oral hydrocodone to be used only for pain flares.  Patient to continue multimodal analgesics including Flexeril as needed, Lyrica 150 mg twice daily, turmeric.  Only 1 month fill of hydrocodone and buprenorphine.  Medication management before July 27.  Med refill of hydrocodone for June 26.  Can consider refilling buprenorphine at RFA visit if effective.  Pharmacotherapy (Medications Ordered): Meds ordered this encounter  Medications  . HYDROcodone-acetaminophen (NORCO) 10-325 MG tablet    Sig: Take 1 tablet by mouth every 8 (eight) hours as needed for up to 30 days for severe pain. Must last 30 days.    Dispense:  90 tablet    Refill:  0    [place] STOP ACT - Not applicable. Fill one day early if pharmacy is closed on scheduled refill date.  . Buprenorphine 7.5 MCG/HR PTWK    Sig: Place 7.5 mcg/hr onto the skin every 7 (seven) days for 30 days.    Dispense:  4 patch    Refill:  0    Do not place this medication, or any  other prescription from our practice, on "Automatic Refill". Patient may have prescription filled one day early if pharmacy is closed on scheduled refill date.   Orders:  No orders of the defined types were placed in this encounter.  Follow-up plan:   Return in about 7 weeks (around 07/27/2019) for Medication Management.    I discussed the assessment and treatment plan with the patient. The patient was provided an opportunity to ask questions and all were answered. The patient agreed with the plan and demonstrated an understanding of the instructions.  Patient advised to call back or seek an in-person evaluation if the symptoms or condition worsens.  Total duration of non-face-to-face encounter: 44minutes.  Note by: Gillis Santa, MD Date: 06/11/2019; Time: 9:46  AM  Note: This dictation was prepared with Dragon dictation. Any transcriptional errors that may result from this process are unintentional.  Disclaimer:  * Given the special circumstances of the COVID-19 pandemic, the federal government has announced that the Office for Civil Rights (OCR) will exercise its enforcement discretion and will not impose penalties on physicians using telehealth in the event of noncompliance with regulatory requirements under the [employer] Insurance Portability and Accountability Act (HIPAA) in connection with the good faith provision of telehealth during the COVID-19 national public health emergency. (AMA)

## 2019-06-29 ENCOUNTER — Ambulatory Visit: Payer: Self-pay | Admitting: Surgery

## 2019-06-30 ENCOUNTER — Telehealth: Payer: Self-pay

## 2019-06-30 NOTE — Telephone Encounter (Signed)
Spoke with patient and she is questioning whether or not she can have the ablation and have rectal surgery soon after.  She has a lot of questions concerning her Butrans patch, hydrocodone as well as after surgery pain control.  I informed her that we could give her the post surgery information sheet to give to her physician .  She really wanted to speak with you, but I will be glad to relay any information.  Thank you

## 2019-06-30 NOTE — Telephone Encounter (Signed)
She left a vm wanting Dr. Holley Raring to call her. Does he do that? She didn't say on the voicemail what she wanted other than to speak to Dr. Holley Raring.

## 2019-07-01 ENCOUNTER — Other Ambulatory Visit: Payer: Self-pay

## 2019-07-01 ENCOUNTER — Other Ambulatory Visit
Admission: RE | Admit: 2019-07-01 | Discharge: 2019-07-01 | Disposition: A | Discharge status: Home / Self Care | Payer: BC Managed Care – PPO | Source: Ambulatory Visit | Attending: Student in an Organized Health Care Education/Training Program | Admitting: Student in an Organized Health Care Education/Training Program

## 2019-07-01 ENCOUNTER — Ambulatory Visit: Payer: Self-pay | Admitting: Surgery

## 2019-07-01 DIAGNOSIS — Z1159 Encounter for screening for other viral diseases: Secondary | ICD-10-CM | POA: Diagnosis not present

## 2019-07-01 DIAGNOSIS — Z01812 Encounter for preprocedural laboratory examination: Secondary | ICD-10-CM | POA: Insufficient documentation

## 2019-07-01 LAB — SARS CORONAVIRUS 2 (TAT 6-24 HRS): SARS Coronavirus 2: NEGATIVE

## 2019-07-01 NOTE — H&P (Signed)
Vanessa Greer Documented: 06/29/2019 3:23 PM Location: [facility] Patient #: [id number] DOB: 1961/09/10 Married / Language: Cleophus Molt / Race: White Female  History of Present Illness Adin Hector MD; 07/01/2019 7:07 AM) The patient is a 58 year old female who presents with condyloma. Note for "Condyloma": ` ` ` The patient returns s/p internal hemorrhoidal ligation. Condyloma laser ablation  Date of procedure: 12/13/2017  Pathology: N/A  Patient returns for 6 month follow-up (was supposed to be 3 months but delayed w COVID pandemic). She feeds well. She is moving her bowels every day. Not needing a fiber supplement every day. Denies any significant bleeding. Worried that the warts are back. Anal discomfort & itching at tmes. ` ` 12/13/2017  9:46 AM  PATIENT: Vanessa Greer 58 y.o. 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 WITH PARTIAL PROLAPSE  Procedure(s):  LASER REMOVAL ABLATION OF CONDYLOMATA EXAMINATION UNDER ANESTHESIA HEMORRHOIDAL LIGATION AND PEXY  SURGEON: Adin Hector, MD  ASSISTANT: RN  ANESTHESIA:   General  Anorectal block using 0.25% bupivicaine with epinephrine at the beginning of case and then follow up at the end with liposomal bupivacaine (Experel)  EBL: Total I/O In: 200 [I.V.:200] Out: 15 [Blood:15]  Delay start of Pharmacological VTE agent (>24hrs) due to surgical blood loss or risk of bleeding: no  DRAINS: none  SPECIMEN: No Specimen  DISPOSITION OF SPECIMEN: N/A  COUNTS: YES  PLAN OF CARE:  Discharge to home after PACU  PATIENT DISPOSITION: PACU - hemodynamically stable.  INDICATION: Patient with perianal condyloma. Burden felt too much to treat in office. Recommendation made for examination under anesthesia with ablation and possible removal of condyloma.   The anatomy & physiology of the anorectal region was discussed. The pathophysiology of anorectal warts and differential diagnosis was discussed. Natural history risks without surgery was discussed such as further growth and cancer. I stressed the importance of office follow-up to catch early recurrence & minimize/halt progression of disease. Interventions such as cauterization by topical agents were discussed.  The patient's symptoms are not adequately controlled by non-operative treatments. I feel the risks & problems of no surgery outweigh the operative risks; therefore, I recommended surgery to treat the anal warts by removal, ablation and/or cauterization.  Risks such as bleeding, infection, need for further treatment, heart attack, death, and other risks were discussed. I noted a good likelihood this will help address the problem. Goals of post-operative recovery were discussed as well. Possibility that this will not correct all symptoms was explained. Post-operative pain, bleeding, constipation, and other problems after surgery were discussed. We will work to minimize complications. Educational handouts further explaining the pathology, treatment options, and bowel regimen were given as well. Questions were answered. The patient expresses understanding & wishes to proceed with surgery.  OR FINDINGS: Perianal bilateral condyloma and patches of hypo-and hyperpigmentation.. No evidence of ulceration for anal intraepithelial neoplasia. Ablated with laser.  Some pruritus ani with three pile internal hemorrhoids enlarged. Prolapsing grade 3 - Left lateral and right anterior. Right posterior grade 2. Ligation  and pexy of hemorrhoidal columns done. Improved anatomy. No resection done.  DESCRIPTION:  Informed consent was confirmed. Patient underwent general anesthesia  without difficulty. Patient was placed into prone positioning. The perianal region was prepped and draped in sterile fashion. Surgical time-out confirmed our plan.  I did digital rectal examination and then transitioned over to anoscopy to get a sense of the anatomy. I also did staining with acetic acid. Findings noted above. Had hypo-and hyperpigmentation on lateral perianal regions in a butterfly pattern with central condyloma. I proceeded to ablate the condyloma using the CO2 laser. Set to 7 W initially. Increased to 10 W. I worked peripherally and then into anal canal. No major abnormalities palpated but some white areas with staining ablated in the anal canal. I repeated inspection. Did ascitic staining. Did repeat ablation on suspicious areas. Hemostasis good.  Because of the enlarged hemorrhoidal piles I did do hemorrhoidal ligation. I used a 2-0 Vicryl suture on a UR-6 needle in a figure-of-eight fashion 6 cm proximal to the anal verge. I started at the largest hemorrhoid pile. I then ran that stitch longitudinally to the white line of [place] over a large Hill-Furgeson retractor to avoid narrowing of the anal canal. I then tied that stitch down to cause a hemorrhoidopexy. I then did hemorrhoidal ligation and pexy at the other columns. At the completion of this, all 6 anorectal columns were ligated and pexied in the classic hexagonal fashion (right anterior/lateral/posterior, left anterior/lateral/posterior).   I repeated anoscopy and examination. Anatomy improved. Hemostasis was good. No other abnormalities noted. No fissure. No fistula. No tumor. No abscess. No true rectal proxccidentia prolapse. Topical dibucaine placed over perianal region with fluff gauze. Patient being extubated to go to the recovery  room.  I had discussed postop care in detail with the patient in the preop holding area. Instructions for post-operative recovery had been given in the office and moral be given at discharge. Prescription for pain medicine written. I discussed operative findings, updated the patient's status, discussed probable steps to recovery, and gave postoperative recommendations to the patient's spouse. Recommendations were made. Questions were answered. He expressed understanding & appreciation.  Ardeth SportsmanSteven C. Kenji Mapel, M.D., F.A.C.S. Gastrointestinal and Minimally Invasive Surgery [facility] [address] [place], [place] [id number] [phone] Main / Paging   Problem List/Past Medical [name](Genasis Zingale C. Yuko Coventry, MD; 06/29/2019 4:02 PM) ANAL CONDYLOMA (A63.0) ENCOUNTER FOR PREOPERATIVE EXAMINATION FOR GENERAL SURGICAL PROCEDURE (Z01.818) PROLAPSED INTERNAL HEMORRHOIDS, GRADE 3 (K64.2) PROLAPSED INTERNAL HEMORRHOIDS, GRADE 2 (K64.1) TOBACCO ABUSE (Z72.0)  Past Surgical History [name](Ethlyn Alto C. Canton Yearby, MD; 06/29/2019 4:02 PM) Appendectomy Colon Polyp Removal - Colonoscopy Foot Surgery Right. Hysterectomy (not due to cancer) - Complete  Diagnostic Studies History [name](Shaelee Forni C. Cabot Cromartie, MD; 06/29/2019 4:02 PM) Colonoscopy within last year Mammogram within last year  Allergies Kristen Cardinal(Sabrina Canty, CMA; 06/29/2019 3:28 PM) No Known Allergies [06/29/2019]: No Known Drug Allergies [11/19/2017]: Allergies Reconciled  Medication History Kristen Cardinal(Sabrina Canty, CMA; 06/29/2019 3:29 PM) Hydrocodone-Acetaminophen (7.5-325MG  Tablet, Oral) Active. Lyrica (150MG  Capsule, Oral) Active. Pantoprazole Sodium (40MG  Tablet DR, Oral) Active. Sertraline HCl (100MG  Tablet, Oral) Active. Simvastatin (40MG  Tablet, Oral) Active. Vitamin D (1000UNIT Tablet, Oral) Active. Omega 3 (1000MG  Capsule, Oral) Active. Turmeric (500MG  Tablet, Oral) Active. traZODone HCl (50MG  Tablet, Oral)  Active. Buprenorphine (7.5MCG/HR Patch Weekly, Transdermal) Active. HYDROcodone-Acetaminophen (10-325MG  Tablet, Oral) Active. Medications Reconciled  Social History [name](Yani Lal C. Nicloe Frontera, MD; 06/29/2019 4:02 PM) Alcohol use Occasional alcohol use. Caffeine use Coffee, Tea. No drug use Tobacco use Former smoker.  Family History [name](Roby Spalla C. Aleese Kamps, MD; 06/29/2019 4:02 PM) Alcohol Abuse Father. Arthritis Father, Mother. Breast Cancer Mother. Colon Polyps  Father. Depression Mother. Diabetes Mellitus Brother, Family Members In General. Heart Disease Father. Heart disease in female family member before age 57 Heart disease in female family member before age 57 Hypertension Brother, Father, Sister. Prostate Cancer Father. Respiratory Condition Father. Thyroid problems Mother.  Pregnancy / Birth History [name](Vale Mousseau C. Ashton Belote, MD; 06/29/2019 4:02 PM) Age at menarche 14 years. Age of menopause <46 Contraceptive History Oral contraceptives. Gravida 2 Maternal age 57-25 Para 1  Other Problems [name](Latitia Housewright C. Larin Weissberg, MD; 06/29/2019 4:02 PM) Anxiety Disorder Back Pain Diverticulosis Gastroesophageal Reflux Disease Hypercholesterolemia Oophorectomy Bilateral.    Vitals (Sabrina Canty CMA; 06/29/2019 3:29 PM) 06/29/2019 3:29 PM Weight: 168.38 lb Height: 66in Body Surface Area: 1.86 m Body Mass Index: 27.18 kg/m  Temp.: 98.34F(Oral)  Pulse: 97 (Regular)  BP: 112/64 (Sitting, Left Arm, Standard)        Physical Exam [name](Addeline Calarco C. Kollins Fenter MD; 07/01/2019 7:07 AM)  General Mental Status-Alert. General Appearance-Not in acute distress. Voice-Normal.  Integumentary Global Assessment Normal Exam - Distribution of scalp and body hair is normal. General Characteristics Overall Skin Surface - no rashes and no suspicious lesions.  Head and Neck Head-normocephalic, atraumatic with no lesions or palpable masses. Face Global Assessment - atraumatic, no  absence of expression. Neck Global Assessment - no abnormal movements, no decreased range of motion. Trachea-midline. Thyroid Gland Characteristics - non-tender.  Eye Eyeball - Left-Extraocular movements intact, No Nystagmus - Left. Eyeball - Right-Extraocular movements intact, No Nystagmus - Right. Upper Eyelid - Left-No Cyanotic - Left. Upper Eyelid - Right-No Cyanotic - Right.  Chest and Lung Exam Inspection Accessory muscles - No use of accessory muscles in breathing.  Female Genitourinary Note: Hypopigmentation bilateral perianal regions like a flattened butterfly. No active bleeding or active wounds. Hemorrhoids. Normal sphincter tone. Held off on digital or anoscopic exam  Rectal Note: Recurrent warts right lateral > left lateral perianal regions - most soft and flat but larger hard calcified mass noted in right perianal region. Still with hyperpigmentation and hypopigmentation as well in a butterfly pattern with "wings" on right and left perianal regions w the warts  Tolerates digital rectal exam. No fissure. No abscess or fistula. I palpate no abnormalities at the anal canal nor anal crypts. I held off on internal anoscopy. Grade 1-2 internal hemorrhoids  Peripheral Vascular Upper Extremity Inspection - Not Gangrenous, No Petechiae. Inspection - Not Gangrenous, No Petechiae.  Neurologic Neurologic evaluation reveals -normal attention span and ability to concentrate, able to name objects and repeat phrases. Appropriate fund of knowledge and normal coordination.  Neuropsychiatric Mental status exam performed with findings of-able to articulate well with normal speech/language, rate, volume and coherence and no evidence of hallucinations, delusions, obsessions or homicidal/suicidal ideation. Orientation-oriented X3.  Musculoskeletal Global Assessment Gait and Station - normal gait and station.  Lymphatic General Lymphatics Description - No  Generalized lymphadenopathy.    Assessment & Plan [name](Meloney Feld C. Sabian Kuba MD; 06/29/2019 4:16 PM)  ANAL CONDYLOMA (A63.0) Impression: Recurrent perianal condylomata refractory to cryotherapy and topical therapy. ablated by laser. Recurrence at one year - small region Tx'd with cryotherpay in office  Second recurrence. I think this requires surgical excision of the regions allow the wounds to second linearly granulate in. Possible internal laser ablation. Outpatient surgery.  Current Plans Surgical (Rectal exam) follow-up after resection of condyloma acuminatum (warts): - every 3 months until negative exam x1, then - every 6 months until negative exan x 1, then - every Year until negative exam x1, then  - as needed  thereafter  Pt Education - [facility] Anal Warts (Emarie Paul) You are being scheduled for surgery- Our schedulers will call you.  You should hear from our office's scheduling department within 5 working days about the location, date, and time of surgery. We try to make accommodations for patient's preferences in scheduling surgery, but sometimes the OR schedule or the surgeon's schedule prevents us from making those accommodations.  If you have not heard from our office [phone]) in 5 working days, call the office and ask for your surgeon's nurse.  If you have other questions about your diagnosis, plan, or surgery, call the office and ask for your surgeon's nurse.   ENCOUNTER FOR PREOPERATIVE EXAMINATION FOR GENERAL SURGICAL PROCEDURE (U98.119(Z01.818)  Current Plans The anatomy & physiology of the anorectal region was discussed. The pathophysiology of anorectal warts and differential diagnosis was discussed. Natural history risks without surgery was discussed such as further growth and cancer. I stressed the importance of office follow-up to catch early recurrence & minimize/halt progression of disease. Interventions such as cauterization or cryotherapy by topical  agents were discussed.  The patient's symptoms are not adequately controlled by non-operative treatments. I feel the risks & problems of no surgery outweigh the operative risks; therefore, I recommended surgery to treat the anal warts by removal, ablation and/or cauterization.  Risks such as bleeding, infection, need for further treatment, heart attack, death, and other risks were discussed. I noted a good likelihood this will help address the problem. Goals of post-operative recovery were discussed as well. Possibility that this will not correct all symptoms was explained. Post-operative pain, bleeding, constipation, and other problems after surgery were discussed. We will work to minimize complications. Educational handouts further explaining the pathology, treatment options, and bowel regimen were given as well. Questions were answered. The patient expresses understanding & wishes to proceed with surgery.  Pt Education - [facility] Rectal Prep for Anorectal outpatient/office surgery: discussed with patient and provided information. Pt Education - [facility] Rectal Surgery HCI (Arliss Frisina): discussed with patient and provided information.  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  Ardeth SportsmanSteven C. Kamsiyochukwu Buist, MD, FACS, MASCRS Gastrointestinal and Minimally Invasive Surgery    [address] [place], [place] [id number] [phone] Main / Paging [phone] Fax

## 2019-07-02 NOTE — Telephone Encounter (Signed)
She will discuss Monday at her appointment

## 2019-07-06 ENCOUNTER — Other Ambulatory Visit: Payer: Self-pay

## 2019-07-06 ENCOUNTER — Ambulatory Visit (HOSPITAL_BASED_OUTPATIENT_CLINIC_OR_DEPARTMENT_OTHER): Payer: BC Managed Care – PPO | Admitting: Student in an Organized Health Care Education/Training Program

## 2019-07-06 ENCOUNTER — Ambulatory Visit
Admission: RE | Admit: 2019-07-06 | Discharge: 2019-07-06 | Disposition: A | Discharge status: Home / Self Care | Payer: BC Managed Care – PPO | Source: Ambulatory Visit | Attending: Student in an Organized Health Care Education/Training Program | Admitting: Student in an Organized Health Care Education/Training Program

## 2019-07-06 VITALS — BP 106/92 | HR 72 | Temp 97.3°F | Resp 14 | Ht 67.0 in | Wt 160.0 lb

## 2019-07-06 DIAGNOSIS — M47816 Spondylosis without myelopathy or radiculopathy, lumbar region: Secondary | ICD-10-CM | POA: Insufficient documentation

## 2019-07-06 MED ORDER — LIDOCAINE HCL 2 % IJ SOLN
INTRAMUSCULAR | Status: AC
  Filled 2019-07-06: qty 20

## 2019-07-06 MED ORDER — LIDOCAINE HCL 2 % IJ SOLN
20.0000 mL | Freq: Once | INTRAMUSCULAR | Status: AC
  Administered 2019-07-06: 400 mg

## 2019-07-06 MED ORDER — ROPIVACAINE HCL 2 MG/ML IJ SOLN
INTRAMUSCULAR | Status: AC
  Filled 2019-07-06: qty 10

## 2019-07-06 MED ORDER — DEXAMETHASONE SODIUM PHOSPHATE 10 MG/ML IJ SOLN
INTRAMUSCULAR | Status: AC
  Filled 2019-07-06: qty 1

## 2019-07-06 MED ORDER — FENTANYL CITRATE (PF) 100 MCG/2ML IJ SOLN
25.0000 ug | INTRAMUSCULAR | Status: DC | PRN
  Administered 2019-07-06: 09:00:00 25 ug via INTRAVENOUS

## 2019-07-06 MED ORDER — FENTANYL CITRATE (PF) 100 MCG/2ML IJ SOLN
INTRAMUSCULAR | Status: AC
  Filled 2019-07-06: qty 2

## 2019-07-06 MED ORDER — ROPIVACAINE HCL 2 MG/ML IJ SOLN
1.0000 mL | Freq: Once | INTRAMUSCULAR | Status: DC
  Filled 2019-07-06: qty 10

## 2019-07-06 MED ORDER — DEXAMETHASONE SODIUM PHOSPHATE 10 MG/ML IJ SOLN
10.0000 mg | Freq: Once | INTRAMUSCULAR | Status: AC
  Administered 2019-07-06: 10 mg
  Filled 2019-07-06: qty 1

## 2019-07-06 MED ORDER — ROPIVACAINE HCL 2 MG/ML IJ SOLN
1.0000 mL | Freq: Once | INTRAMUSCULAR | Status: AC
  Administered 2019-07-06: 09:00:00 1 mL via EPIDURAL

## 2019-07-06 MED ORDER — DEXAMETHASONE SODIUM PHOSPHATE 10 MG/ML IJ SOLN
10.0000 mg | Freq: Once | INTRAMUSCULAR | Status: AC
  Administered 2019-07-06: 10 mg

## 2019-07-06 NOTE — Progress Notes (Signed)
Patient's Name: Vanessa Greer  MRN: [id number]  Referring Provider: Adin Hector, MD  DOB: May 09, 1961  PCP: Adin Hector, MD  DOS: 07/06/2019  Note by: Gillis Santa, MD  Service setting: Ambulatory outpatient  Specialty: Interventional Pain Management  Patient type: Established  Location: [facility] (AMB) [facility]  Visit type: Interventional Procedure   Primary Reason for Visit: Interventional Pain Management Treatment. CC: Back Pain (lower)  Procedure:          Anesthesia, Analgesia, Anxiolysis:  Type: Thermal Lumbar Facet, Medial Branch Radiofrequency Ablation/Neurotomy  #1  Primary Purpose: Therapeutic Region: Posterolateral Lumbosacral Spine Level:L3, L4, L5, & S1 Medial Branch Level(s). These levels will denervate the L3-4, L4-5, and the L5-S1 lumbar facet joints. Laterality: Left  Type: Moderate (Conscious) Sedation combined with Local Anesthesia Indication(s): Analgesia and Anxiety Route: Intravenous (IV) IV Access: Secured Sedation: Meaningful verbal contact was maintained at all times during the procedure  Local Anesthetic: Lidocaine 1-2%  Position: Prone   Indications: 1. Lumbar facet arthropathy   2. Lumbar spondylosis    Ms. Kohan has been dealing with the above chronic pain for longer than three months and has either failed to respond, was unable to tolerate, or simply did not get enough benefit from other more conservative therapies including, but not limited to: 1. Over-the-counter medications 2. Anti-inflammatory medications 3. Muscle relaxants 4. Membrane stabilizers 5. Opioids 6. Physical therapy and/or chiropractic manipulation 7. Modalities (Heat, ice, etc.) 8. Invasive techniques such as nerve blocks. Ms. Petros has attained more than 50% relief of the pain from a series of diagnostic injections conducted in separate occasions.  Pain Score: Pre-procedure: 3 /10 Post-procedure: 0-No pain/10  Pre-op Assessment:  Ms. Osbourn is a 58  y.o. (year old), female patient, seen today for interventional treatment. She  has a past surgical history that includes Abdominal hysterectomy; Foot surgery; Ectopic pregnancy surgery; Colonoscopy; Laser ablation condolamata (N/A, 12/13/2017); and Esophagogastroduodenoscopy (egd) with propofol (N/A, 03/17/2019). Ms. Skilton has a current medication list which includes the following prescription(s): buprenorphine, vitamin d3, cyclobenzaprine, hydrocodone-acetaminophen, lyrica, multivitamin with minerals, omega-3 fatty acids, pantoprazole, sertraline, simvastatin, trazodone, and turmeric, and the following Facility-Administered Medications: fentanyl and ropivacaine (pf) 2 mg/ml (0.2%). Her primarily concern today is the Back Pain (lower)  Initial Vital Signs:  Pulse/HCG Rate: 72ECG Heart Rate: 60 Temp: 98.7 F (37.1 C) Resp: 16 BP: 131/76 SpO2: 98 %  BMI: Estimated body mass index is 25.06 kg/m as calculated from the following:   Height as of this encounter: 5\' 7"  (1.702 m).   Weight as of this encounter: 160 lb (72.6 kg).  Risk Assessment: Allergies: Reviewed. She is allergic to bupropion.  Allergy Precautions: None required Coagulopathies: Reviewed. None identified.  Blood-thinner therapy: None at this time Active Infection(s): Reviewed. None identified. Ms. Theisen is afebrile  Site Confirmation: Ms. Higdon was asked to confirm the procedure and laterality before marking the site Procedure checklist: Completed Consent: Before the procedure and under the influence of no sedative(s), amnesic(s), or anxiolytics, the patient was informed of the treatment options, risks and possible complications. To fulfill our ethical and legal obligations, as recommended by the American Medical Association's Code of Ethics, I have informed the patient of my clinical impression; the nature and purpose of the treatment or procedure; the risks, benefits, and possible complications of the intervention; the  alternatives, including doing nothing; the risk(s) and benefit(s) of the alternative treatment(s) or procedure(s); and the risk(s) and benefit(s) of doing nothing. The patient was  provided information about the general risks and possible complications associated with the procedure. These may include, but are not limited to: failure to achieve desired goals, infection, bleeding, organ or nerve damage, allergic reactions, paralysis, and death. In addition, the patient was informed of those risks and complications associated to Spine-related procedures, such as failure to decrease pain; infection (i.e.: Meningitis, epidural or intraspinal abscess); bleeding (i.e.: epidural hematoma, subarachnoid hemorrhage, or any other type of intraspinal or peri-dural bleeding); organ or nerve damage (i.e.: Any type of peripheral nerve, nerve root, or spinal cord injury) with subsequent damage to sensory, motor, and/or autonomic systems, resulting in permanent pain, numbness, and/or weakness of one or several areas of the body; allergic reactions; (i.e.: anaphylactic reaction); and/or death. Furthermore, the patient was informed of those risks and complications associated with the medications. These include, but are not limited to: allergic reactions (i.e.: anaphylactic or anaphylactoid reaction(s)); adrenal axis suppression; blood sugar elevation that in diabetics may result in ketoacidosis or comma; water retention that in patients with history of congestive heart failure may result in shortness of breath, pulmonary edema, and decompensation with resultant heart failure; weight gain; swelling or edema; medication-induced neural toxicity; particulate matter embolism and blood vessel occlusion with resultant organ, and/or nervous system infarction; and/or aseptic necrosis of one or more joints. Finally, the patient was informed that Medicine is not an exact science; therefore, there is also the possibility of unforeseen or  unpredictable risks and/or possible complications that may result in a catastrophic outcome. The patient indicated having understood very clearly. We have given the patient no guarantees and we have made no promises. Enough time was given to the patient to ask questions, all of which were answered to the patient's satisfaction. Ms. Verne has indicated that she wanted to continue with the procedure. Attestation: I, the ordering provider, attest that I have discussed with the patient the benefits, risks, side-effects, alternatives, likelihood of achieving goals, and potential problems during recovery for the procedure that I have provided informed consent. Date   Time: 07/06/2019  7:53 AM  Pre-Procedure Preparation:  Monitoring: As per clinic protocol. Respiration, ETCO2, SpO2, BP, heart rate and rhythm monitor placed and checked for adequate function Safety Precautions: Patient was assessed for positional comfort and pressure points before starting the procedure. Time-out: I initiated and conducted the "Time-out" before starting the procedure, as per protocol. The patient was asked to participate by confirming the accuracy of the "Time Out" information. Verification of the correct person, site, and procedure were performed and confirmed by me, the nursing staff, and the patient. "Time-out" conducted as per Joint Commission's Universal Protocol (UP.01.01.01). Time: 0827  Description of Procedure:          Laterality: Left Levels:  L3, L4, L5, & S1 Medial Branch Level(s), at the L3-4, L4-5, and the L5-S1 lumbar facet joints. Area Prepped: Lumbosacral Prepping solution: DuraPrep (Iodine Povacrylex [0.7% available iodine] and Isopropyl Alcohol, 74% w/w) Safety Precautions: Aspiration looking for blood return was conducted prior to all injections. At no point did we inject any substances, as a needle was being advanced. Before injecting, the patient was told to immediately notify me if she was experiencing  any new onset of "ringing in the ears, or metallic taste in the mouth". No attempts were made at seeking any paresthesias. Safe injection practices and needle disposal techniques used. Medications properly checked for expiration dates. SDV (single dose vial) medications used. After the completion of the procedure, all disposable equipment used was discarded  in the proper designated medical waste containers. Local Anesthesia: Protocol guidelines were followed. The patient was positioned over the fluoroscopy table. The area was prepped in the usual manner. The time-out was completed. The target area was identified using fluoroscopy. A 12-in long, straight, sterile hemostat was used with fluoroscopic guidance to locate the targets for each level blocked. Once located, the skin was marked with an approved surgical skin marker. Once all sites were marked, the skin (epidermis, dermis, and hypodermis), as well as deeper tissues (fat, connective tissue and muscle) were infiltrated with a small amount of a short-acting local anesthetic, loaded on a 10cc syringe with a 25G, 1.5-in  Needle. An appropriate amount of time was allowed for local anesthetics to take effect before proceeding to the next step. Local Anesthetic: Lidocaine 2.0% The unused portion of the local anesthetic was discarded in the proper designated containers. Technical explanation of process:  Radiofrequency Ablation (RFA)  L3 Medial Branch Nerve RFA: The target area for the L3 medial branch is at the junction of the postero-lateral aspect of the superior articular process and the superior, posterior, and medial edge of the transverse process of L4. Under fluoroscopic guidance, a Radiofrequency needle was inserted until contact was made with os over the superior postero-lateral aspect of the pedicular shadow (target area). Sensory and motor testing was conducted to properly adjust the position of the needle. Once satisfactory placement of the needle  was achieved, the numbing solution was slowly injected after negative aspiration for blood. 1mL of the nerve block solution was injected without difficulty or complication. After waiting for at least 3 minutes, the ablation was performed. Once completed, the needle was removed intact. L4 Medial Branch Nerve RFA: The target area for the L4 medial branch is at the junction of the postero-lateral aspect of the superior articular process and the superior, posterior, and medial edge of the transverse process of L5. Under fluoroscopic guidance, a Radiofrequency needle was inserted until contact was made with os over the superior postero-lateral aspect of the pedicular shadow (target area). Sensory and motor testing was conducted to properly adjust the position of the needle. Once satisfactory placement of the needle was achieved, the numbing solution was slowly injected after negative aspiration for blood. 1mL of the nerve block solution was injected without difficulty or complication. After waiting for at least 3 minutes, the ablation was performed. Once completed, the needle was removed intact. L5 Medial Branch Nerve RFA: The target area for the L5 medial branch is at the junction of the postero-lateral aspect of the superior articular process of S1 and the superior, posterior, and medial edge of the sacral ala. Under fluoroscopic guidance, a Radiofrequency needle was inserted until contact was made with os over the superior postero-lateral aspect of the pedicular shadow (target area). Sensory and motor testing was conducted to properly adjust the position of the needle. Once satisfactory placement of the needle was achieved, the numbing solution was slowly injected after negative aspiration for blood. 1 mL of the nerve block solution was injected without difficulty or complication. After waiting for at least 3 minutes, the ablation was performed. Once completed, the needle was removed intact. S1 Medial Branch Nerve  RFA: The target area for the S1 medial branch is located inferior to the junction of the S1 superior articular process and the L5 inferior articular process, posterior, inferior, and lateral to the 6 o'clock position of the L5-S1 facet joint, just superior to the S1 posterior foramen. Under fluoroscopic guidance, the  Radiofrequency needle was advanced until contact was made with os over the Target area. Sensory and motor testing was conducted to properly adjust the position of the needle. Once satisfactory placement of the needle was achieved, the numbing solution was slowly injected after negative aspiration for blood.1 mL of the nerve block solution was injected without difficulty or complication. After waiting for at least 3 minutes, the ablation was performed. Once completed, the needle was removed intact. Radiofrequency lesioning (ablation):  Radiofrequency Generator: NeuroTherm [id number] Sensory Stimulation Parameters: 50 Hz was used to locate & identify the nerve, making sure that the needle was positioned such that there was no sensory stimulation below 0.3 V or above 0.7 V. Motor Stimulation Parameters: 2 Hz was used to evaluate the motor component. Care was taken not to lesion any nerves that demonstrated motor stimulation of the lower extremities at an output of less than 2.5 times that of the sensory threshold, or a maximum of 2.0 V. Lesioning Technique Parameters: Standard Radiofrequency settings. (Not bipolar or pulsed.) Temperature Settings: 80 degrees C Lesioning time: 60 seconds Intra-operative Compliance: Compliant Materials & Medications: Needle(s) (Electrode/Cannula) Type: Teflon-coated, curved tip, Radiofrequency needle(s) Gauge: 22G Length: 10cm Numbing solution: 5 cc solution made of 3 cc of 0.2% ropivacaine, 2 cc of Decadron 10 mg/cc, 1 cc injected at each level prior to lesioning.  The unused portion of the solution was discarded in the proper designated containers.  Once the  entire procedure was completed, the treated area was cleaned, making sure to leave some of the prepping solution back to take advantage of its long term bactericidal properties.  Illustration of the posterior view of the lumbar spine and the posterior neural structures. Laminae of L2 through S1 are labeled. DPRL5, dorsal primary ramus of L5; DPRS1, dorsal primary ramus of S1; DPR3, dorsal primary ramus of L3; FJ, facet (zygapophyseal) joint L3-L4; I, inferior articular process of L4; LB1, lateral branch of dorsal primary ramus of L1; IAB, inferior articular branches from L3 medial branch (supplies L4-L5 facet joint); IBP, intermediate branch plexus; MB3, medial branch of dorsal primary ramus of L3; NR3, third lumbar nerve root; S, superior articular process of L5; SAB, superior articular branches from L4 (supplies L4-5 facet joint also); TP3, transverse process of L3.  Vitals:   07/06/19 0851 07/06/19 0858 07/06/19 0909 07/06/19 0918  BP: 128/67 (!) 164/41 130/61 (!) 106/92  Pulse:      Resp: 12 12 11 14   Temp:  (!) 97.2 F (36.2 C)  (!) 97.3 F (36.3 C)  SpO2: 96% 98% 97% 97%  Weight:      Height:        Start Time: 0827 hrs. End Time: 0851 hrs.  Imaging Guidance (Spinal):          Type of Imaging Technique: Fluoroscopy Guidance (Spinal) Indication(s): Assistance in needle guidance and placement for procedures requiring needle placement in or near specific anatomical locations not easily accessible without such assistance. Exposure Time: Please see nurses notes. Contrast: None used. Fluoroscopic Guidance: I was personally present during the use of fluoroscopy. "Tunnel Vision Technique" used to obtain the best possible view of the target area. Parallax error corrected before commencing the procedure. "Direction-depth-direction" technique used to introduce the needle under continuous pulsed fluoroscopy. Once target was reached, antero-posterior, oblique, and lateral fluoroscopic projection  used confirm needle placement in all planes. Images permanently stored in EMR. Interpretation: No contrast injected. I personally interpreted the imaging intraoperatively. Adequate needle placement confirmed in multiple planes. Permanent  images saved into the patient's record.  Antibiotic Prophylaxis:   Anti-infectives (From admission, onward)   None     Indication(s): None identified  Post-operative Assessment:  Post-procedure Vital Signs:  Pulse/HCG Rate: 7267 Temp: (!) 97.3 F (36.3 C) Resp: 14 BP: (!) 106/92 SpO2: 97 %  EBL: None  Complications: No immediate post-treatment complications observed by team, or reported by patient.  Note: The patient tolerated the entire procedure well. A repeat set of vitals were taken after the procedure and the patient was kept under observation following institutional policy, for this type of procedure. Post-procedural neurological assessment was performed, showing return to baseline, prior to discharge. The patient was provided with post-procedure discharge instructions, including a section on how to identify potential problems. Should any problems arise concerning this procedure, the patient was given instructions to immediately contact us, at any time, without hesitation. In any case, we plan to contact the patient by telephone for a follow-up status report regarding this interventional procedure.  Comments:  No additional relevant information. 5 out of 5 strength bilateral lower extremity: Plantar flexion, dorsiflexion, knee flexion, knee extension.  Plan of Care  Orders:  Orders Placed This Encounter  Procedures   Radiofrequency,Lumbar    Standing Status:   Future    Standing Expiration Date:   01/05/2021    Scheduling Instructions:     Side(s): RIGHT     Level: L3-4, L4-5, & L5-S1 Facets ( L3, L4, L5, & S1 Medial Branch Nerves)     Sedation: With Sedation     Scheduling Timeframe: 2-3 weeks    Order Specific Question:   Where will  this procedure be performed?    Answer:   [facility]   DG [facility] C-ARM 1-60 MIN NO REPORT    Intraoperative interpretation by procedural physician at [facility].    Standing Status:   Standing    Number of Occurrences:   1    Order Specific Question:   Reason for exam:    Answer:   Assistance in needle guidance and placement for procedures requiring needle placement in or near specific anatomical locations not easily accessible without such assistance.   Medications ordered for procedure: Meds ordered this encounter  Medications   lidocaine (XYLOCAINE) 2 % (with pres) injection 400 mg   fentaNYL (SUBLIMAZE) injection 25-50 mcg    Make sure Narcan is available in the pyxis when using this medication. In the event of respiratory depression (RR< 8/min): Titrate NARCAN (naloxone) in increments of 0.1 to 0.2 mg IV at 2-3 minute intervals, until desired degree of reversal.   ropivacaine (PF) 2 mg/mL (0.2%) (NAROPIN) injection 1 mL   dexamethasone (DECADRON) injection 10 mg   dexamethasone (DECADRON) injection 10 mg   ropivacaine (PF) 2 mg/mL (0.2%) (NAROPIN) injection 1 mL   Medications administered: We administered lidocaine, fentaNYL, dexamethasone, dexamethasone, and ropivacaine (PF) 2 mg/mL (0.2%).  See the medical record for exact dosing, route, and time of administration.  Follow-up plan:   Return in about 2 weeks (around 07/20/2019) for Contra-lateral RFA- R L3,4,5, S1.         Recent Visits Date Type Provider Dept  06/11/19 Office Visit Edward JollyLateef, Johnny Gorter, MD [facility]  04/20/19 Office Visit Edward JollyLateef, Uno Esau, MD [facility]  Showing recent visits within past 90 days and meeting all other requirements   Today's Visits Date Type Provider Dept  07/06/19 Procedure visit Edward JollyLateef, Allysia Ingles, MD [facility]  Showing today's visits and meeting  all other requirements   Future Appointments Date Type Provider Dept  07/27/19  Appointment Edward JollyLateef, Shepherd Finnan, MD [facility]  Showing future appointments within next 90 days and meeting all other requirements    Disposition: Discharge home  Discharge Date & Time: 07/06/2019; 0919 hrs.   Primary Care Physician: Lynnea FerrierKlein, Bert J III, MD [facility] Note by: Edward JollyBilal Chayce Christoph, MD Date: 07/06/2019; Time: 11:55 AM  Disclaimer:  Medicine is not an exact science. The only guarantee in medicine is that nothing is guaranteed. It is important to note that the decision to proceed with this intervention was based on the information collected from the patient. The Data and conclusions were drawn from the patient's questionnaire, the interview, and the physical examination. Because the information was provided in large part by the patient, it cannot be guaranteed that it has not been purposely or unconsciously manipulated. Every effort has been made to obtain as much relevant data as possible for this evaluation. It is important to note that the conclusions that lead to this procedure are derived in large part from the available data. Always take into account that the treatment will also be dependent on availability of resources and existing treatment guidelines, considered by other Pain Management Practitioners as being common knowledge and practice, at the time of the intervention. For Medico-Legal purposes, it is also important to point out that variation in procedural techniques and pharmacological choices are the acceptable norm. The indications, contraindications, technique, and results of the above procedure should only be interpreted and judged by a Board-Certified Interventional Pain Specialist with extensive familiarity and expertise in the same exact procedure and technique.

## 2019-07-06 NOTE — Progress Notes (Signed)
I was called into the exam room by Bilal Lateef, MD to serve as chaperone during the examination. The exam room door was open as I came in and once in, I closed it for privacy. I remained in the room during the entire exam. Once the exam was completed, the exam room door was again opened and left open. Exam conducted in a professional manner with no evidence of any improprieties. 

## 2019-07-06 NOTE — Patient Instructions (Signed)
Post-procedure Information What to expect: Most procedures involve the use of a local anesthetic (numbing medicine), and a steroid (anti-inflammatory medicine).  The local anesthetics may cause temporary numbness and weakness of the legs or arms, depending on the location of the block. This numbness/weakness may last 4-6 hours, depending on the local anesthetic used. In rare instances, it can last up to 24 hours. While numb, you must be very careful not to injure the extremity.  After any procedure, you could expect the pain to get better within 15-20 minutes. This relief is temporary and may last 4-6 hours. Once the local anesthetics wears off, you could experience discomfort, possibly more than usual, for up to 10 (ten) days. In the case of radiofrequencies, it may last up to 6 weeks. Surgeries may take up to 8 weeks for the healing process. The discomfort is due to the irritation caused by needles going through skin and muscle. To minimize the discomfort, we recommend using ice the first day, and heat from then on. The ice should be applied for 15 minutes on, and 15 minutes off. Keep repeating this cycle until bedtime. Avoid applying the ice directly to the skin, to prevent frostbite. Heat should be used daily, until the pain improves (4-10 days). Be careful not to burn yourself.  Occasionally you may experience muscle spasms or cramps. These occur as a consequence of the irritation caused by the needle sticks to the muscle and the blood that will inevitably be lost into the surrounding muscle tissue. Blood tends to be very irritating to tissues, which tend to react by going into spasm. These spasms may start the same day of your procedure, but they may also take days to develop. This late onset type of spasm or cramp is usually caused by electrolyte imbalances triggered by the steroids, at the level of the kidney. Cramps and spasms tend to respond well to muscle relaxants, multivitamins (some are  triggered by the procedure, but may have their origins in vitamin deficiencies), and "Gatorade", or any sports drinks that can replenish any electrolyte imbalances. (If you are a diabetic, ask your pharmacist to get you a sugar-free brand.) Warm showers or baths may also be helpful. Stretching exercises are highly recommended. General Instructions:  Be alert for signs of possible infection: redness, swelling, heat, red streaks, elevated temperature, and/or fever. These typically appear 4 to 6 days after the procedure. Immediately notify your doctor if you experience unusual bleeding, difficulty breathing, or loss of bowel or bladder control. If you experience increased pain, do not increase your pain medicine intake, unless instructed by your pain physician. Post-Procedure Care:  Be careful in moving about. Muscle spasms in the area of the injection may occur. Applying ice or heat to the area is often helpful. The incidence of spinal headaches after epidural injections ranges between 1.4% and 6%. If you develop a headache that does not seem to respond to conservative therapy, please let your physician know. This can be treated with an epidural blood patch.   Post-procedure numbness or redness is to be expected, however it should average 4 to 6 hours. If numbness and weakness of your extremities begins to develop 4 to 6 hours after your procedure, and is felt to be progressing and worsening, immediately contact your physician.   Diet:  If you experience nausea, do not eat until this sensation goes away. If you had a "Stellate Ganglion Block" for upper extremity "Reflex Sympathetic Dystrophy", do not eat or drink until your   hoarseness goes away. In any case, always start with liquids first and if you tolerate them well, then slowly progress to more solid foods. Activity:  For the first 4 to 6 hours after the procedure, use caution in moving about as you may experience numbness and/or weakness. Use caution in  cooking, using household electrical appliances, and climbing steps. If you need to reach your Doctor call our office: [phone] Monday-Thursday 8:00 am - 4:00 PM    Fridays: Closed     In case of an emergency: In case of emergency, call 911 or go to the nearest emergency room and have the physician there call us.  Interpretation of Procedure Every nerve block has two components: a diagnostic component, and a treatment component. Unrealistic expectations are the most common causes of "perceived failure".  In a perfect world, a single nerve block should be able to completely and permanently eliminate the pain. Sadly, the world is not perfect.  Most pain management nerve blocks are performed using local anesthetics and steroids. Steroids are responsible for any long-term benefit that you may experience. Their purpose is to decrease any chronic swelling that may exist in the area. Steroids begin to work immediately after being injected. However, most patients will not experience any benefits until 5 to 10 days after the injection, when the swelling has come down to the point where they can tell a difference. Steroids will only help if there is swelling to be treated. As such, they can assist with the diagnosis. If effective, they suggest an inflammatory component to the pain, and if ineffective, they rule out inflammation as the main cause or component of the problem. If the problem is one of mechanical compression, you will get no benefit from those steroids.   In the case of local anesthetics, they have a crucial role in the diagnosis of your condition. Most will begin to work within15 to 20 minutes after injection. The duration will depend on the type used (short- vs. Long-acting). It is of outmost importance that patients keep tract of their pain, after the procedure. To assist with this matter, a "Post-procedure Pain Diary" is provided. Make sure to complete it and to bring it back to your  follow-up appointment.  As long as the patient keeps accurate, detailed records of their symptoms after every procedure, and returns to have those interpreted, every procedure will provide us with invaluable information. Even a block that does not provide the patient with any relief, will always provide us with information about the mechanism and the origin of the pain. The only time a nerve block can be considered a waste of time is when patients do not keep track of the results, or do not keep their post-procedure appointment.  Reporting the results back to your physician The Pain Score  Pain is a subjective complaint. It cannot be seen, touched, or measured. We depend entirely on the patient's report of the pain in order to assess your condition and treatment. To evaluate the pain, we use a pain scale, where "0" means "No Pain", and a "10" is "the worst possible pain that you can even imagine" (i.e. something like been eaten alive by a shark or being torn apart by a lion).   You will frequently be asked to rate your pain. Please be as accurate, remember that medical decisions will be based on your responses. Please do not rate your pain above a 10. Doing so is actually interpreted as "symptom magnification" (exaggeration), as   well as lack of understanding with regards to the scale. To put this into perspective, when you tell us that your pain is at a 10 (ten), what you are saying is that there is nothing we can do to make this pain any worse. (Carefully think about that.) Preparing for Procedure with Sedation Instructions: . Oral Intake: Do not eat or drink anything for at least 8 hours prior to your procedure. . Transportation: Public transportation is not allowed. Bring an adult driver. The driver must be physically present in our waiting room before any procedure can be started. . Physical Assistance: Bring an adult capable of physically assisting you, in the event you need help. . Blood Pressure  Medicine: Take your blood pressure medicine with a sip of water the morning of the procedure. . Insulin: Take only  of your normal insulin dose. . Preventing infections: Shower with an antibacterial soap the morning of your procedure. . Build-up your immune system: Take 1000 mg of Vitamin C with every meal (3 times a day) the day prior to your procedure. . Pregnancy: If you are pregnant, call and cancel the procedure. . Sickness: If you have a cold, fever, or any active infections, call and cancel the procedure. . Arrival: You must be in the facility at least 30 minutes prior to your scheduled procedure. . Children: Do not bring children with you. . Dress appropriately: Bring dark clothing that you would not mind if they get stained. . Valuables: Do not bring any jewelry or valuables. Procedure appointments are reserved for interventional treatments only. . No Prescription Refills. . No medication changes will be discussed during procedure appointments. . No disability issues will be discussed. .  

## 2019-07-07 ENCOUNTER — Telehealth: Payer: Self-pay

## 2019-07-07 NOTE — Telephone Encounter (Signed)
Patient was called and no problems reported. 

## 2019-07-17 ENCOUNTER — Other Ambulatory Visit: Admission: RE | Admit: 2019-07-17 | Payer: BC Managed Care – PPO | Source: Ambulatory Visit

## 2019-07-22 ENCOUNTER — Ambulatory Visit
Admission: RE | Admit: 2019-07-22 | Discharge: 2019-07-22 | Disposition: A | Discharge status: Home / Self Care | Payer: BC Managed Care – PPO | Source: Ambulatory Visit | Attending: Student in an Organized Health Care Education/Training Program | Admitting: Student in an Organized Health Care Education/Training Program

## 2019-07-22 ENCOUNTER — Other Ambulatory Visit: Payer: Self-pay

## 2019-07-22 ENCOUNTER — Ambulatory Visit (HOSPITAL_BASED_OUTPATIENT_CLINIC_OR_DEPARTMENT_OTHER): Payer: BC Managed Care – PPO | Admitting: Student in an Organized Health Care Education/Training Program

## 2019-07-22 ENCOUNTER — Encounter: Payer: Self-pay | Admitting: Student in an Organized Health Care Education/Training Program

## 2019-07-22 DIAGNOSIS — M47816 Spondylosis without myelopathy or radiculopathy, lumbar region: Secondary | ICD-10-CM | POA: Diagnosis not present

## 2019-07-22 MED ORDER — FENTANYL CITRATE (PF) 100 MCG/2ML IJ SOLN
25.0000 ug | INTRAMUSCULAR | Status: DC | PRN
  Administered 2019-07-22: 100 ug via INTRAVENOUS

## 2019-07-22 MED ORDER — DEXAMETHASONE SODIUM PHOSPHATE 10 MG/ML IJ SOLN
10.0000 mg | Freq: Once | INTRAMUSCULAR | Status: AC
  Administered 2019-07-22: 10 mg

## 2019-07-22 MED ORDER — ROPIVACAINE HCL 2 MG/ML IJ SOLN
1.0000 mL | Freq: Once | INTRAMUSCULAR | Status: AC
  Administered 2019-07-22: 10 mL via EPIDURAL
  Filled 2019-07-22: qty 10

## 2019-07-22 MED ORDER — CYCLOBENZAPRINE HCL 5 MG PO TABS: 5.0000 mg | ORAL_TABLET | Freq: Every day | ORAL | 2 refills | Status: AC | PRN

## 2019-07-22 MED ORDER — LIDOCAINE HCL 2 % IJ SOLN
20.0000 mL | Freq: Once | INTRAMUSCULAR | Status: AC
  Administered 2019-07-22: 08:00:00 400 mg

## 2019-07-22 MED ORDER — FENTANYL CITRATE (PF) 100 MCG/2ML IJ SOLN
INTRAMUSCULAR | Status: AC
  Filled 2019-07-22: qty 2

## 2019-07-22 MED ORDER — DEXAMETHASONE SODIUM PHOSPHATE 10 MG/ML IJ SOLN
INTRAMUSCULAR | Status: AC
  Filled 2019-07-22: qty 1

## 2019-07-22 MED ORDER — ROPIVACAINE HCL 2 MG/ML IJ SOLN
INTRAMUSCULAR | Status: AC
  Filled 2019-07-22: qty 10

## 2019-07-22 MED ORDER — BUPRENORPHINE 10 MCG/HR TD PTWK: 1.0000 | MEDICATED_PATCH | TRANSDERMAL | 1 refills | Status: AC

## 2019-07-22 MED ORDER — DEXAMETHASONE SODIUM PHOSPHATE 10 MG/ML IJ SOLN
10.0000 mg | Freq: Once | INTRAMUSCULAR | Status: AC
  Administered 2019-07-22: 10 mg
  Filled 2019-07-22: qty 1

## 2019-07-22 MED ORDER — HYDROCODONE-ACETAMINOPHEN 10-325 MG PO TABS: 1.0000 | ORAL_TABLET | Freq: Two times a day (BID) | ORAL | 0 refills | Status: DC | PRN

## 2019-07-22 MED ORDER — ROPIVACAINE HCL 2 MG/ML IJ SOLN
2.0000 mL | Freq: Once | INTRAMUSCULAR | Status: AC
  Administered 2019-07-22: 10 mL via EPIDURAL

## 2019-07-22 MED ORDER — LIDOCAINE HCL 2 % IJ SOLN
INTRAMUSCULAR | Status: AC
  Filled 2019-07-22: qty 20

## 2019-07-22 NOTE — Progress Notes (Signed)
Safety precautions to be maintained throughout the outpatient stay will include: orient to surroundings, keep bed in low position, maintain call bell within reach at all times, provide assistance with transfer out of bed and ambulation.  

## 2019-07-22 NOTE — Patient Instructions (Addendum)

## 2019-07-22 NOTE — Progress Notes (Signed)
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 dexamethasone, ropivacaine (PF) 2 mg/mL (0.2%), and dexamethasone.  See the medical record for exact dosing, route, and time of administration.  Follow-up plan:   Return in about 7 weeks (around 09/09/2019) for Medication Management, Post Procedure Evaluation.         Recent Visits Date Type Provider Dept  07/06/19 Procedure visit Gillis Santa, MD [facility]  06/11/19 Office Visit Gillis Santa, MD [facility]  Showing recent visits within past 90 days and meeting all other requirements   Today's Visits Date Type Provider Dept  07/22/19 Procedure visit Gillis Santa, MD [facility]  Showing today's visits and meeting all other requirements   Future Appointments Date Type Provider Dept  10/01/19 Appointment Gillis Santa, MD [facility]  Showing future appointments within next 90 days and meeting all other requirements    Disposition: Discharge home  Discharge Date & Time: 07/22/2019; 0921 hrs.   Primary Care Physician: Adin Hector,  MD Location: [facility] Note by: Gillis Santa, MD Date: 07/22/2019; Time: 9:23 AM  Disclaimer:  Medicine is not an exact science. The only guarantee in medicine is that nothing is guaranteed. It is important to note that the decision to proceed with this intervention was based on the information collected from the patient. The Data and conclusions were drawn from the patient's questionnaire, the interview, and the physical examination. Because the information was provided in large part by the patient, it cannot be guaranteed that it has not been purposely or unconsciously manipulated. Every effort has been made to obtain as much  relevant data as possible for this evaluation. It is important to note that the conclusions that lead to this procedure are derived in large part from the available data. Always take into account that the treatment will also be dependent on availability of resources and existing treatment guidelines, considered by other Pain Management Practitioners as being common knowledge and practice, at the time of the intervention. For Medico-Legal purposes, it is also important to point out that variation in procedural techniques and pharmacological choices are the acceptable norm. The indications, contraindications, technique, and results of the above procedure should only be interpreted and judged by a Board-Certified Interventional Pain Specialist with extensive familiarity and expertise in the same exact procedure and technique.

## 2019-07-23 ENCOUNTER — Telehealth: Payer: Self-pay

## 2019-07-23 NOTE — Telephone Encounter (Signed)
Patient states she is still real sore, encouraged to rest and could use some heat if needed.

## 2019-07-27 ENCOUNTER — Encounter: Payer: BC Managed Care – PPO | Admitting: Student in an Organized Health Care Education/Training Program

## 2019-08-21 ENCOUNTER — Other Ambulatory Visit (HOSPITAL_COMMUNITY): Payer: Self-pay | Admitting: Infectious Diseases

## 2019-08-21 ENCOUNTER — Ambulatory Visit
Admission: RE | Admit: 2019-08-21 | Discharge: 2019-08-21 | Disposition: A | Discharge status: Home / Self Care | Payer: BC Managed Care – PPO | Source: Ambulatory Visit | Attending: Infectious Diseases | Admitting: Infectious Diseases

## 2019-08-21 ENCOUNTER — Other Ambulatory Visit: Payer: Self-pay

## 2019-08-21 ENCOUNTER — Other Ambulatory Visit: Payer: Self-pay | Admitting: Infectious Diseases

## 2019-08-21 DIAGNOSIS — M7989 Other specified soft tissue disorders: Secondary | ICD-10-CM | POA: Diagnosis not present

## 2019-09-01 ENCOUNTER — Telehealth: Payer: Self-pay | Admitting: *Deleted

## 2019-09-01 NOTE — Telephone Encounter (Signed)
Patient has hydrocodone - apap 10/325 on file, written on 07/22/19 to fill on 08/25/19.  States "received by pharmacy".

## 2019-09-01 NOTE — Telephone Encounter (Signed)
Vanessa Greer and she states that pharmacy told her that they did not have her Rx for 08/25/19 on file.  When speaking to pharmacy they state they do have the Rx on file for hydrocodone - apap 10-325 written 07/22/19 to fill on 08/25/19.  They will give her a call and let her know.

## 2019-09-08 ENCOUNTER — Other Ambulatory Visit: Payer: Self-pay | Admitting: Internal Medicine

## 2019-09-08 DIAGNOSIS — Z1231 Encounter for screening mammogram for malignant neoplasm of breast: Secondary | ICD-10-CM

## 2019-09-10 ENCOUNTER — Telehealth: Payer: Self-pay | Admitting: Student in an Organized Health Care Education/Training Program

## 2019-09-10 NOTE — Telephone Encounter (Signed)
Patient states she is having Edema from her knees down into feet. PCP had echocardiogram done and it shows some fluid around her heart and she is going to cardiologist. She is wondering if the RF she had done in July would have caused any of this problem. Please call and let patient know

## 2019-09-10 NOTE — Telephone Encounter (Signed)
Called and talked with patient. She was wondering if the RF had anything to do with the fluid around her heart. Instructed her to continue to see cardiologist and to keep [place] informed of what is going on with her. Patient with understanding.

## 2019-09-15 ENCOUNTER — Telehealth: Payer: Self-pay | Admitting: *Deleted

## 2019-09-15 DIAGNOSIS — Z87891 Personal history of nicotine dependence: Secondary | ICD-10-CM

## 2019-09-15 DIAGNOSIS — Z122 Encounter for screening for malignant neoplasm of respiratory organs: Secondary | ICD-10-CM

## 2019-09-15 NOTE — Telephone Encounter (Signed)
Please try to move patient appointment up.  The [previous return appointment was requested for 9-9- 20 but it is not scheduled until 10-01-2019.  She will be out of her medications.  She needs appointment asap.

## 2019-09-15 NOTE — Telephone Encounter (Signed)
Left message for patient to notify them that it is time to schedule annual low dose lung cancer screening CT scan. Instructed patient to call back to verify information prior to scan being scheduled. CB# [phone].  

## 2019-09-15 NOTE — Telephone Encounter (Signed)
Patient has been notified that annual lung cancer screening low dose CT scan is due currently or will be in near future. Confirmed that patient is within the age range of 55-77, and asymptomatic, (no signs or symptoms of lung cancer). Patient denies illness that would prevent curative treatment for lung cancer if found. Verified smoking history, (current, 36.5 pack year). The shared decision making visit was done 09/18/18. Patient is agreeable for CT scan being scheduled.

## 2019-09-21 NOTE — Telephone Encounter (Signed)
Please see the note from 6 days ago that was routed to you. It requested to move patient's appointment up. The ORIGINAL order was for patient to return around 9-09 for med refill so that she would not run out. You may have to ask Dr. Holley Raring to ok as an add on so that she will not run out of medications. Thank you- Anderson Malta

## 2019-09-22 ENCOUNTER — Telehealth: Payer: Self-pay | Admitting: Student in an Organized Health Care Education/Training Program

## 2019-09-22 NOTE — Telephone Encounter (Signed)
Patient lvmail 09-21-19 at 4:28 stating she needs refill on Butrans. Has appt on 10-1

## 2019-09-22 NOTE — Telephone Encounter (Signed)
Dr. Lateef please advise. 

## 2019-09-22 NOTE — Telephone Encounter (Signed)
Create virtual visit for Thursday at 3:30 pm. Cancel Visit on 10/1. Thanks

## 2019-09-23 ENCOUNTER — Ambulatory Visit
Admission: RE | Admit: 2019-09-23 | Discharge: 2019-09-23 | Disposition: A | Discharge status: Home / Self Care | Payer: BC Managed Care – PPO | Source: Ambulatory Visit | Attending: Oncology | Admitting: Oncology

## 2019-09-23 ENCOUNTER — Other Ambulatory Visit: Payer: Self-pay

## 2019-09-23 DIAGNOSIS — Z87891 Personal history of nicotine dependence: Secondary | ICD-10-CM | POA: Insufficient documentation

## 2019-09-23 DIAGNOSIS — Z122 Encounter for screening for malignant neoplasm of respiratory organs: Secondary | ICD-10-CM | POA: Insufficient documentation

## 2019-09-24 ENCOUNTER — Ambulatory Visit
Payer: BC Managed Care – PPO | Attending: Student in an Organized Health Care Education/Training Program | Admitting: Student in an Organized Health Care Education/Training Program

## 2019-09-24 ENCOUNTER — Encounter: Payer: Self-pay | Admitting: Student in an Organized Health Care Education/Training Program

## 2019-09-24 DIAGNOSIS — M47816 Spondylosis without myelopathy or radiculopathy, lumbar region: Secondary | ICD-10-CM | POA: Diagnosis not present

## 2019-09-24 DIAGNOSIS — M5136 Other intervertebral disc degeneration, lumbar region: Secondary | ICD-10-CM | POA: Diagnosis not present

## 2019-09-24 DIAGNOSIS — Z79899 Other long term (current) drug therapy: Secondary | ICD-10-CM | POA: Diagnosis not present

## 2019-09-24 DIAGNOSIS — G894 Chronic pain syndrome: Secondary | ICD-10-CM | POA: Diagnosis not present

## 2019-09-24 MED ORDER — HYDROCODONE-ACETAMINOPHEN 10-325 MG PO TABS: 1.0000 | ORAL_TABLET | Freq: Two times a day (BID) | ORAL | 0 refills | Status: DC | PRN

## 2019-09-24 MED ORDER — BUPRENORPHINE 10 MCG/HR TD PTWK: 1.0000 | MEDICATED_PATCH | TRANSDERMAL | 2 refills | Status: AC

## 2019-09-24 NOTE — Progress Notes (Signed)
Pain Management Virtual Encounter Note - Virtual Visit via Video Conference Telehealth (real-time audio visits between healthcare provider and patient).   Patient's Phone No. & Preferred Pharmacy:  [phone] (home); [phone] (mobile); (Preferred) [phone] [email or web address]  [facility] - [place], [place] - [address] [place] [address] Phone: [phone] Fax: [phone]  [id number] [place], [place] - [address] AT [facility] [address] [place] [address] Phone: [phone] Fax: [phone]    Pre-screening note:  Our staff contacted Vanessa Greer and offered her an "in person", "face-to-face" appointment versus a telephone encounter. She indicated preferring the telephone encounter, at this time.   Reason for Virtual Visit: COVID-19*  Social distancing based on CDC and AMA recommendations.   I contacted Vanessa Greer on 09/24/2019 via video conference.      I clearly identified myself as Edward Jolly, MD. I verified that I was speaking with the correct person using two identifiers (Name: Vanessa Greer, and date of birth: [birth year]).  Advanced Informed Consent I sought verbal advanced consent from Vanessa Greer for virtual visit interactions. I informed Vanessa Greer of possible security and privacy concerns, risks, and limitations associated with providing "not-in-person" medical evaluation and management services. I also informed Vanessa Greer of the availability of "in-person" appointments. Finally, I informed her that there would be a charge for the virtual visit and that she could be  personally, fully or partially, financially responsible for it. Vanessa Greer expressed understanding and agreed to proceed.   Historic Elements   Vanessa Greer is a 58 y.o. year old, female patient evaluated today after her last encounter by our practice on 09/22/2019. Vanessa Greer  has a  past medical history of Anxiety, Back pain, DDD (degenerative disc disease), lumbar, Depression, GERD (gastroesophageal reflux disease), High cholesterol, Lumbar radiculitis, and Pneumothorax (1982). She also  has a past surgical history that includes Abdominal hysterectomy; Foot surgery; Ectopic pregnancy surgery; Colonoscopy; Laser ablation condolamata (N/A, 12/13/2017); and Esophagogastroduodenoscopy (egd) with propofol (N/A, 03/17/2019). Vanessa Greer has a current medication list which includes the following prescription(s): buprenorphine, vitamin d3, cyclobenzaprine, cyclobenzaprine, hydrocodone-acetaminophen, hydrocodone-acetaminophen, hydrocodone-acetaminophen, lyrica, multivitamin with minerals, omega-3 fatty acids, pantoprazole, sertraline, simvastatin, trazodone, and turmeric. She  reports that she has been smoking cigarettes. She has a 41.00 pack-year smoking history. She has never used smokeless tobacco. She reports current alcohol use. She reports that she does not use drugs. Vanessa Greer is allergic to bupropion.   HPI  Today, she is being contacted for medication management.   No change in medical history since last visit.  Patient's pain is at baseline.  Patient continues multimodal pain regimen as prescribed.  States that it provides pain relief and improvement in functional status.  Placed on Lasix for LE edema- 20 mg.  Of note, patient is status post left L3, L4, L5, S1 RFA on 07/06/2019 and right L3, L4, L5, S1 RFA on 07/22/2019.  These are still providing her with moderate pain relief in regards to her axial low back pain.  We can consider repeating these after December 2020 if patient has return of pain to pre-block levels.  Otherwise, patient is pleased with her overall pain management.  We will refill Butrans and hydrocodone as below.  Follow-up in 3 months.  Pharmacotherapy Assessment  Analgesic:  09/01/2019  1   07/22/2019  Hydrocodone-Acetamin 10-325 MG  60.00  30 Bi  Lat   [id number]    Wal ([id number])   0  20.00 MME  Comm Ins   [place]  08/21/2019  1   07/22/2019  Buprenorphine 10 Mcg/Hr Patch  4.00  28 Bi Lat   [id number]   Wal ([id number])   0  0.24 mg  Comm Ins   [place]    Monitoring: Pharmacotherapy: No side-effects or adverse reactions reported. [place] PMP: PDMP reviewed during this encounter.       Compliance: No problems identified. Effectiveness: Clinically acceptable. Plan: Refer to "POC".  UDS:  Summary  Date Value Ref Range Status  12/16/2018 FINAL  Final    Comment:    ==================================================================== TOXASSURE SELECT 13 (MW) ==================================================================== Test                             Result       Flag       Units Drug Present and Declared for Prescription Verification   Hydrocodone                    916          EXPECTED   ng/mg creat   Hydromorphone                  272          EXPECTED   ng/mg creat   Dihydrocodeine                 308          EXPECTED   ng/mg creat   Norhydrocodone                 2268         EXPECTED   ng/mg creat    Sources of hydrocodone include scheduled prescription    medications. Hydromorphone, dihydrocodeine and norhydrocodone are    expected metabolites of hydrocodone. Hydromorphone and    dihydrocodeine are also available as scheduled prescription    medications. ==================================================================== Test                      Result    Flag   Units      Ref Range   Creatinine              25               mg/dL      >=22 ==================================================================== Declared Medications:  The flagging and interpretation on this report are based on the  following declared medications.  Unexpected results may arise from  inaccuracies in the declared medications.  **Note: The testing scope of this panel includes these medications:  Hydrocodone (Hydrocodone-Acetaminophen)  **Note: The testing scope of this  panel does not include following  reported medications:  Acetaminophen (Hydrocodone-Acetaminophen)  Cyclobenzaprine  Multivitamin (MVI)  Omega-3 Fatty Acids  Pantoprazole  Pregabalin  Sertraline  Simvastatin  Turmeric  Vitamin D3 ==================================================================== For clinical consultation, please call [phone]. ====================================================================     Imaging  Last 90 days:  US Venous Img Lower Bilateral  Result Date: 08/21/2019 CLINICAL DATA:  58 year old female with a history of bilateral leg swelling and pain EXAM: BILATERAL LOWER EXTREMITY VENOUS DOPPLER ULTRASOUND TECHNIQUE: Gray-scale sonography with graded compression, as well as color Doppler and duplex ultrasound were performed to evaluate the lower extremity deep venous systems from the level of the common femoral vein and including the common femoral,  femoral, profunda femoral, popliteal and calf veins including the posterior tibial, peroneal and gastrocnemius veins when visible. The superficial great saphenous vein was also interrogated. Spectral Doppler was utilized to evaluate flow at rest and with distal augmentation maneuvers in the common femoral, femoral and popliteal veins. COMPARISON:  None. FINDINGS: RIGHT LOWER EXTREMITY Common Femoral Vein: No evidence of thrombus. Normal compressibility, respiratory phasicity and response to augmentation. Saphenofemoral Junction: No evidence of thrombus. Normal compressibility and flow on color Doppler imaging. Profunda Femoral Vein: No evidence of thrombus. Normal compressibility and flow on color Doppler imaging. Femoral Vein: No evidence of thrombus. Normal compressibility, respiratory phasicity and response to augmentation. Popliteal Vein: No evidence of thrombus. Normal compressibility, respiratory phasicity and response to augmentation. Calf Veins: No evidence of thrombus. Normal compressibility and flow on  color Doppler imaging. Superficial Great Saphenous Vein: No evidence of thrombus. Normal compressibility and flow on color Doppler imaging. Other Findings:  None. LEFT LOWER EXTREMITY Common Femoral Vein: No evidence of thrombus. Normal compressibility, respiratory phasicity and response to augmentation. Saphenofemoral Junction: No evidence of thrombus. Normal compressibility and flow on color Doppler imaging. Profunda Femoral Vein: No evidence of thrombus. Normal compressibility and flow on color Doppler imaging. Femoral Vein: No evidence of thrombus. Normal compressibility, respiratory phasicity and response to augmentation. Popliteal Vein: No evidence of thrombus. Normal compressibility, respiratory phasicity and response to augmentation. Calf Veins: No evidence of thrombus. Normal compressibility and flow on color Doppler imaging. Superficial Great Saphenous Vein: No evidence of thrombus. Normal compressibility and flow on color Doppler imaging. Other Findings:  None. IMPRESSION: Sonographic survey of the bilateral lower extremities negative for DVT Electronically Signed   By: Corrie Mckusick D.O.   On: 08/21/2019 11:14   [facility] C-arm 1-60 Min No Report  Result Date: 07/22/2019 Fluoro was used, but no Radiologist interpretation will be provided. Please refer to "NOTES" tab for provider progress note.  Dg Pain Clinic C-arm 1-60 Min No Report  Result Date: 07/06/2019 Fluoro was used, but no Radiologist interpretation will be provided. Please refer to "NOTES" tab for provider progress note.  Ct Chest Lung Cancer Screening Low Dose Wo Contrast  Result Date: 09/23/2019 CLINICAL DATA:  a 58 year old female with 37 pack-year history of smoking. Lung cancer screening. EXAM: CT CHEST WITHOUT CONTRAST LOW-DOSE FOR LUNG CANCER SCREENING TECHNIQUE: Multidetector CT imaging of the chest was performed following the standard protocol without IV contrast. COMPARISON:  09/18/2018 FINDINGS: Cardiovascular: The  heart size is normal. No substantial pericardial effusion. Atherosclerotic calcification is noted in the wall of the thoracic aorta. Mediastinum/Nodes: No mediastinal lymphadenopathy. No evidence for gross hilar lymphadenopathy although assessment is limited by the lack of intravenous contrast on today's study. The esophagus has normal imaging features. There is no axillary lymphadenopathy. Lungs/Pleura: Paraseptal and centrilobular emphysema noted bilaterally. The tiny scattered bilateral pulmonary nodules identified on the previous study are stable. No new suspicious pulmonary nodule or mass. Right lower lobe calcified granuloma evident. No new suspicious pulmonary nodule or mass. No focal airspace consolidation. No pleural effusion. Upper Abdomen: 1.9 cm right adrenal nodule is stable in size with average attenuation of 3 Hounsfield units, consistent with adenoma. Left adrenal gland unremarkable. Musculoskeletal: No worrisome lytic or sclerotic osseous abnormality. IMPRESSION: 1. Lung-RADS 2, benign appearance or behavior. Continue annual screening with low-dose chest CT without contrast in 12 months. 2.  Emphysema. (ICD10-J43.9) 3.  Aortic Atherosclerois (ICD10-170.0) Electronically Signed   By: Misty Stanley M.D.   On: 09/23/2019 12:04  Assessment  The primary encounter diagnosis was Lumbar facet arthropathy. Diagnoses of Lumbar spondylosis, Lumbar degenerative disc disease, Chronic pain syndrome, and Controlled substance agreement signed were also pertinent to this visit.  Plan of Care  I am having Vanessa Greer start on buprenorphine, HYDROcodone-acetaminophen, and HYDROcodone-acetaminophen. I am also having her maintain her Lyrica, sertraline, simvastatin, pantoprazole, cyclobenzaprine, multivitamin with minerals, Turmeric, Omega-3 Fatty Acids (OMEGA 3 500 PO), Vitamin D3, traZODone, cyclobenzaprine, and HYDROcodone-acetaminophen.  Pharmacotherapy (Medications Ordered): Meds ordered this  encounter  Medications  . buprenorphine (BUTRANS) 10 MCG/HR PTWK patch    Sig: Place 1 patch onto the skin every 7 (seven) days.    Dispense:  4 patch    Refill:  2    Do not place this medication, or any other prescription from our practice, on "Automatic Refill". Patient may have prescription filled one day early if pharmacy is closed on scheduled refill date.  Marland Kitchen. HYDROcodone-acetaminophen (NORCO) 10-325 MG tablet    Sig: Take 1 tablet by mouth every 12 (twelve) hours as needed for severe pain. Must last 30 days.    Dispense:  60 tablet    Refill:  0    [place] STOP ACT - Not applicable. Fill one day early if pharmacy is closed on scheduled refill date.  Marland Kitchen. HYDROcodone-acetaminophen (NORCO) 10-325 MG tablet    Sig: Take 1 tablet by mouth every 12 (twelve) hours as needed for severe pain. Must last 30 days.    Dispense:  60 tablet    Refill:  0    [place] STOP ACT - Not applicable. Fill one day early if pharmacy is closed on scheduled refill date.  Marland Kitchen. HYDROcodone-acetaminophen (NORCO) 10-325 MG tablet    Sig: Take 1 tablet by mouth every 12 (twelve) hours as needed for severe pain. Must last 30 days.    Dispense:  60 tablet    Refill:  0    [place] STOP ACT - Not applicable. Fill one day early if pharmacy is closed on scheduled refill date.   Continue multimodal analgesics including Lyrica, Flexeril, turmeric.  Follow-up in 3 months for medication management.  Follow-up plan:   Return in about 3 months (around 12/24/2019) for Medication Management, in person (needs UDS).     status post left L3, L4, L5, S1 RFA on 07/06/2019 and right L3, L4, L5, S1 RFA on 07/22/2019- helped, can consider repeating after december   Recent Visits Date Type Provider Dept  07/22/19 Procedure visit Edward JollyLateef, Aquanetta Schwarz, MD [facility]  07/06/19 Procedure visit Edward JollyLateef, Compton Brigance, MD [facility]  Showing recent visits within past 90 days and meeting all other requirements   Today's Visits Date Type Provider Dept   09/24/19 Office Visit Edward JollyLateef, Remer Couse, MD [facility]  Showing today's visits and meeting all other requirements   Future Appointments No visits were found meeting these conditions.  Showing future appointments within next 90 days and meeting all other requirements   I discussed the assessment and treatment plan with the patient. The patient was provided an opportunity to ask questions and all were answered. The patient agreed with the plan and demonstrated an understanding of the instructions.  Patient advised to call back or seek an in-person evaluation if the symptoms or condition worsens.  Total duration of non-face-to-face encounter: 25minutes.  Note by: Edward JollyBilal Laylonie Marzec, MD Date: 09/24/2019; Time: 3:45 PM  Note: This dictation was prepared with Dragon dictation. Any transcriptional errors that may result from this process are unintentional.  Disclaimer:  *  Given the special circumstances of the COVID-19 pandemic, the federal government has announced that the Office for Civil Rights (OCR) will exercise its enforcement discretion and will not impose penalties on physicians using telehealth in the event of noncompliance with regulatory requirements under the [place] and [place] (HIPAA) in connection with the good faith provision of telehealth during the URKYH-06 national public health emergency. ([place])

## 2019-09-30 ENCOUNTER — Encounter: Payer: Self-pay | Admitting: *Deleted

## 2019-10-01 ENCOUNTER — Ambulatory Visit: Payer: BC Managed Care – PPO | Admitting: Student in an Organized Health Care Education/Training Program

## 2019-10-14 ENCOUNTER — Ambulatory Visit
Admission: RE | Admit: 2019-10-14 | Discharge: 2019-10-14 | Disposition: A | Discharge status: Home / Self Care | Payer: BC Managed Care – PPO | Source: Ambulatory Visit | Attending: Internal Medicine | Admitting: Internal Medicine

## 2019-10-14 DIAGNOSIS — Z1231 Encounter for screening mammogram for malignant neoplasm of breast: Secondary | ICD-10-CM | POA: Diagnosis not present

## 2019-12-09 ENCOUNTER — Telehealth: Payer: Self-pay

## 2019-12-09 NOTE — Telephone Encounter (Signed)
The pain patch for the last 4 weeks has caused a itchy raised red area. She discontinued it Saturday. She hasn't had any withdrawal symptoms but the area is still red.

## 2019-12-09 NOTE — Telephone Encounter (Signed)
Dr Lateef, FYI 

## 2019-12-20 ENCOUNTER — Other Ambulatory Visit: Payer: Self-pay | Admitting: Student in an Organized Health Care Education/Training Program

## 2019-12-30 ENCOUNTER — Telehealth: Payer: Self-pay | Admitting: Student in an Organized Health Care Education/Training Program

## 2019-12-30 ENCOUNTER — Telehealth: Payer: Self-pay

## 2019-12-30 ENCOUNTER — Encounter: Payer: Self-pay | Admitting: Student in an Organized Health Care Education/Training Program

## 2019-12-30 NOTE — Telephone Encounter (Signed)
Patient is out of Hydrocodone. Was supposed to be set up for appt before Dec 24. Not sure why she wasn't set up appt. She is scheduled for 2:30 on Mon. Also will be coming in to get UDS either at hospital or office.  Wants to know if there is anyway she can get 1 script sent in so she wont go into withdrawal, or what she is supposed to do.

## 2019-12-30 NOTE — Telephone Encounter (Signed)
Called patient and she states that she is currently out of medication (Hydrocodone 10/325mg). She did not have a MM appointment until today when she called.  She stated that she thought she had another prescription at the pharm. And she did not. Patient afraid she will go through withdrawals. Dicussed the importance of scheduling an appointment before she runs out of medication to not wait until the last day. Insformed patient that Dr Lateef was on vacation and there where no other doctors in the office the rest of week. Patient states she will probably call her PCP and see if he would give her a prescription. I gave the patient the options that she could always go to the ED with any problems. 

## 2019-12-30 NOTE — Progress Notes (Deleted)
Called patient and she states that she is currently out of medication (Hydrocodone 10/325mg ). She did not have a MM appointment until today when she called.  She stated that she thought she had another prescription at the pharm. And she did not. Patient afraid she will go through withdrawals. Dicussed the importance of scheduling an appointment before she runs out of medication to not wait until the last day. Insformed patient that Dr Holley Raring was on vacation and there where no other doctors in the office the rest of week. Patient states she will probably call her PCP and see if he would give her a prescription. I gave the patient the options that she could always go to the ED with any problems.

## 2019-12-30 NOTE — Telephone Encounter (Signed)
Documentation completed and talked with patient

## 2020-01-04 ENCOUNTER — Ambulatory Visit
Payer: BC Managed Care – PPO | Attending: Student in an Organized Health Care Education/Training Program | Admitting: Student in an Organized Health Care Education/Training Program

## 2020-01-04 ENCOUNTER — Encounter: Payer: Self-pay | Admitting: Student in an Organized Health Care Education/Training Program

## 2020-01-04 ENCOUNTER — Other Ambulatory Visit: Payer: Self-pay

## 2020-01-04 DIAGNOSIS — G894 Chronic pain syndrome: Secondary | ICD-10-CM | POA: Diagnosis not present

## 2020-01-04 DIAGNOSIS — M47816 Spondylosis without myelopathy or radiculopathy, lumbar region: Secondary | ICD-10-CM

## 2020-01-04 DIAGNOSIS — M5136 Other intervertebral disc degeneration, lumbar region: Secondary | ICD-10-CM | POA: Diagnosis not present

## 2020-01-04 MED ORDER — HYDROCODONE-ACETAMINOPHEN 10-325 MG PO TABS: 1.0000 | ORAL_TABLET | Freq: Three times a day (TID) | ORAL | 0 refills | Status: DC | PRN

## 2020-01-04 NOTE — Progress Notes (Signed)
Virtual Encounter - Pain Management PROVIDER NOTE: Information contained herein reflects review and annotations entered in association with encounter. Interpretation of such information and data should be left to medically-trained personnel. Information provided to patient can be located elsewhere in the medical record under "Patient Instructions". Document created using STT-dictation technology, any transcriptional errors that may result from process are unintentional.    Contact & Pharmacy Preferred: [phone] Home: [phone] (home) Mobile: [phone] (mobile) E-mail: [email or web address]  [facility] - [place], [place] - [address] MA[address] [place] [address] Phone: [phone] Fax: [phone]  [id number] [place], [place] - [address] AT [facility] OF [address] [place] [address] Phone: [phone] Fax: [phone]   Pre-screening  Ms. Sherral Hammers offered "in-person" vs "virtual" encounter. She indicated preferring virtual for this encounter.   Reason COVID-19*  Social distancing based on CDC and AMA recommendations.   I contacted [name] on 01/04/2020 via video conference.      I clearly identified myself as Edward Jolly, MD. I verified that I was speaking with the correct person using two identifiers (Name: JOHNISHA LOUKS, and date of birth: 11-Nov-1961).  Consent I sought verbal advanced consent from [name] for virtual visit interactions. I informed Ms. Archbold of possible security and privacy concerns, risks, and limitations associated with providing "not-in-person" medical evaluation and management services. I also informed Ms. Bega of the availability of "in-person" appointments. Finally, I informed her that there would be a charge for the virtual visit and that she could be  personally, fully or partially, financially responsible for it. Ms. Caffey expressed understanding  and agreed to proceed.   Historic Elements   Ms. KAYLANIE CAPILI is a 59 y.o. year old, female patient evaluated today after her last encounter by our practice on 12/30/2019. Ms. Forinash  has a past medical history of Anxiety, Back pain, DDD (degenerative disc disease), lumbar, Depression, GERD (gastroesophageal reflux disease), High cholesterol, Lumbar radiculitis, and Pneumothorax (1982). She also  has a past surgical history that includes Abdominal hysterectomy; Foot surgery; Ectopic pregnancy surgery; Colonoscopy; Laser ablation condolamata (N/A, 12/13/2017); and Esophagogastroduodenoscopy (egd) with propofol (N/A, 03/17/2019). Ms. Kregel has a current medication list which includes the following prescription(s): vitamin d3, cyclobenzaprine, cyclobenzaprine, lyrica, multivitamin with minerals, omega-3 fatty acids, pantoprazole, sertraline, simvastatin, trazodone, turmeric, hydrocodone-acetaminophen, and [START ON 02/03/2020] hydrocodone-acetaminophen. She  reports that she has been smoking cigarettes. She has a 20.50 pack-year smoking history. She has never used smokeless tobacco. She reports current alcohol use. She reports that she does not use drugs. Ms. Bonk is allergic to bupropion.   HPI  Today, she is being contacted for medication management.   Pt location: home Provider location: [facility] office  Patient's pain is at baseline.  No significant changes in her medical history since her last visit.  She states that while the Butrans patch was helpful, it was causing her to break out in a rash and she has since discontinued.  She states that she has been having more difficulty managing her pain as result of discontinuation of buprenorphine.  I did offer the patient as needed Benadryl that she can take at night if she wanted to retrial the buprenorphine patch however she does not.  We discussed increasing her hydrocodone to 3 times a day as needed (from twice daily).  Medications do help her function  and help reduce her pain.  We will discontinue her buprenorphine.  She is continuing her Lyrica.  We also discussed repeating lumbar facet medial branch radiofrequency ablation as this was previously done at left L3, L4, L5, S1 on 07/06/2019 followed by the right at L3, L4, L5, S1 on 07/22/2019.  Patient states that she will think about this further and if that her low back pain worsens, she will contact our clinic to repeat lumbar RFA.  Pharmacotherapy Assessment  Analgesic: Discontinue Butrans patch given rash.  We will increase hydrocodone from 10 mg twice daily as needed, quantity 60/month to 10 mg 3 times daily as needed, quantity 90/month.  12/04/2019  1   11/03/2019  Pregabalin 150 MG Capsule  60.00  30 Be Kle   [id number]   [facility] ([id number])   1  2.01 LME  Comm Ins   [place]  12/02/2019  1   09/24/2019  Hydrocodone-Acetamin 10-325 MG  60.00  30 Bi Lat   [id number]   [facility] ([id number])   0  20.00 MME  Comm Ins   [place]  11/23/2019  1   09/24/2019  Buprenorphine 10 Mcg/Hr Patch  4.00  28 Bi Lat   [id number]   [facility] ([id number])   2  0.24 mg  Comm Ins   [place]     Monitoring: Pharmacotherapy: No side-effects or adverse reactions reported. [place] PMP: PDMP reviewed during this encounter.       Compliance: No problems identified. Effectiveness: Clinically acceptable. Plan: Refer to "POC".  UDS:  Summary  Date Value Ref Range Status  12/16/2018 FINAL  Final    Comment:    ==================================================================== TOXASSURE SELECT 13 (MW) ==================================================================== Test                             Result       Flag       Units Drug Present and Declared for Prescription Verification   Hydrocodone                    916          EXPECTED   ng/mg creat   Hydromorphone                  272          EXPECTED   ng/mg creat   Dihydrocodeine                 308          EXPECTED   ng/mg creat   Norhydrocodone                 2268         EXPECTED   ng/mg creat    Sources  of hydrocodone include scheduled prescription    medications. Hydromorphone, dihydrocodeine and norhydrocodone are    expected metabolites of hydrocodone. Hydromorphone and    dihydrocodeine are also available as scheduled prescription    medications. ==================================================================== Test                      Result    Flag   Units      Ref Range   Creatinine              25               mg/dL      >=34 ==================================================================== Declared Medications:  The flagging and interpretation on this report  are based on the  following declared medications.  Unexpected results may arise from  inaccuracies in the declared medications.  **Note: The testing scope of this panel includes these medications:  Hydrocodone (Hydrocodone-Acetaminophen)  **Note: The testing scope of this panel does not include following  reported medications:  Acetaminophen (Hydrocodone-Acetaminophen)  Cyclobenzaprine  Multivitamin (MVI)  Omega-3 Fatty Acids  Pantoprazole  Pregabalin  Sertraline  Simvastatin  Turmeric  Vitamin D3 ==================================================================== For clinical consultation, please call [phone]. ====================================================================      Imaging  MM 3D SCREEN BREAST BILATERAL CLINICAL DATA:  Screening.  EXAM: DIGITAL SCREENING BILATERAL MAMMOGRAM WITH TOMO AND CAD  COMPARISON:  Previous exam(s).  ACR Breast Density Category c: The breast tissue is heterogeneously dense, which may obscure small masses.  FINDINGS: There are no findings suspicious for malignancy. Images were processed with CAD.  IMPRESSION: No mammographic evidence of malignancy. A result letter of this screening mammogram will be mailed directly to the patient.  RECOMMENDATION: Screening mammogram in one year. (Code:SM-B-01Y)  BI-RADS CATEGORY  1:  Negative.  Electronically Signed   By: Annia Belt M.D.   On: 10/15/2019 12:30   Assessment  The primary encounter diagnosis was Lumbar facet arthropathy. Diagnoses of Lumbar spondylosis, Lumbar degenerative disc disease, and Chronic pain syndrome were also pertinent to this visit.  Plan of Care   I have changed Mickie Bail. Scheuring's HYDROcodone-acetaminophen. I am also having her start on HYDROcodone-acetaminophen. Additionally, I am having her maintain her Lyrica, sertraline, simvastatin, pantoprazole, cyclobenzaprine, multivitamin with minerals, Turmeric, Omega-3 Fatty Acids (OMEGA 3 500 PO), Vitamin D3, traZODone, and cyclobenzaprine.  -Discontinue Butrans patch given rash Pharmacotherapy (Medications Ordered): Meds ordered this encounter  Medications  . HYDROcodone-acetaminophen (NORCO) 10-325 MG tablet    Sig: Take 1 tablet by mouth every 8 (eight) hours as needed for severe pain. Must last 30 days. For chronic pain.    Dispense:  90 tablet    Refill:  0    [place] STOP ACT - Not applicable. Fill one day early if pharmacy is closed on scheduled refill date.  Marland Kitchen HYDROcodone-acetaminophen (NORCO) 10-325 MG tablet    Sig: Take 1 tablet by mouth every 8 (eight) hours as needed for severe pain. Must last 30 days. For chronic pain.    Dispense:  90 tablet    Refill:  0    [place] STOP ACT - Not applicable. Fill one day early if pharmacy is closed on scheduled refill date.   Orders:  Orders Placed This Encounter  Procedures  . RFA - Lumbar Facet (PRN)    For low back pain.    Standing Status:   Standing    Number of Occurrences:   1    Standing Expiration Date:   07/03/2021    Scheduling Instructions:     Side(s): Left-sided     Level: L3-4, L4-5, & L5-S1 Facets (L3, L4, L5, & S1 Medial Branch Nerves)     Sedation: With Sedation     TIMEFRAME: PRN procedure. (Ms. Gilberto will call when needed.)     Previously done 07/06/2019    Order Specific Question:   Where will this procedure be  performed?    Answer:   [facility]  . UDS (Compliance-13) ([facility]) ([facility]) (Established Pt.)    Volume: 30 ml(s). Minimum 3 ml of urine is needed. Document temperature of fresh sample. Indications: Long term (current) use of opiate analgesic (N98.921)   Follow-up plan:   Return in about 8 weeks (around 02/29/2020)  for Medication Management, virtual.     status post left L3, L4, L5, S1 RFA on 07/06/2019 and right L3, L4, L5, S1 RFA on 07/22/2019- helped, repeat PRN   Recent Visits No visits were found meeting these conditions.  Showing recent visits within past 90 days and meeting all other requirements   Today's Visits Date Type Provider Dept  01/04/20 Office Visit Gillis Santa, MD [facility]  Showing today's visits and meeting all other requirements   Future Appointments No visits were found meeting these conditions.  Showing future appointments within next 90 days and meeting all other requirements   I discussed the assessment and treatment plan with the patient. The patient was provided an opportunity to ask questions and all were answered. The patient agreed with the plan and demonstrated an understanding of the instructions.  Patient advised to call back or seek an in-person evaluation if the symptoms or condition worsens.  Total duration of non-face-to-face encounter: 30 minutes.  Note by: Gillis Santa, MD Date: 01/04/2020; Time: 10:54 AM

## 2020-01-05 ENCOUNTER — Encounter: Payer: BC Managed Care – PPO | Admitting: Student in an Organized Health Care Education/Training Program

## 2020-01-07 ENCOUNTER — Other Ambulatory Visit
Admission: RE | Admit: 2020-01-07 | Discharge: 2020-01-07 | Disposition: A | Discharge status: Home / Self Care | Payer: BC Managed Care – PPO | Source: Ambulatory Visit | Attending: Student in an Organized Health Care Education/Training Program | Admitting: Student in an Organized Health Care Education/Training Program

## 2020-01-07 DIAGNOSIS — G894 Chronic pain syndrome: Secondary | ICD-10-CM | POA: Diagnosis not present

## 2020-01-07 LAB — URINE DRUG SCREEN, QUALITATIVE (ARMC ONLY)
Amphetamines, Ur Screen: NOT DETECTED
Barbiturates, Ur Screen: NOT DETECTED
Benzodiazepine, Ur Scrn: NOT DETECTED
Cannabinoid 50 Ng, Ur ~~LOC~~: NOT DETECTED
Cocaine Metabolite,Ur ~~LOC~~: NOT DETECTED
MDMA (Ecstasy)Ur Screen: NOT DETECTED
Methadone Scn, Ur: NOT DETECTED
Opiate, Ur Screen: POSITIVE — AB
Phencyclidine (PCP) Ur S: NOT DETECTED
Tricyclic, Ur Screen: NOT DETECTED

## 2020-02-25 ENCOUNTER — Telehealth: Payer: Self-pay | Admitting: *Deleted

## 2020-02-25 ENCOUNTER — Other Ambulatory Visit: Payer: Self-pay

## 2020-02-25 ENCOUNTER — Ambulatory Visit
Payer: BC Managed Care – PPO | Attending: Student in an Organized Health Care Education/Training Program | Admitting: Student in an Organized Health Care Education/Training Program

## 2020-02-25 ENCOUNTER — Encounter: Payer: Self-pay | Admitting: Student in an Organized Health Care Education/Training Program

## 2020-02-25 DIAGNOSIS — M47816 Spondylosis without myelopathy or radiculopathy, lumbar region: Secondary | ICD-10-CM | POA: Diagnosis not present

## 2020-02-25 DIAGNOSIS — G894 Chronic pain syndrome: Secondary | ICD-10-CM

## 2020-02-25 DIAGNOSIS — M533 Sacrococcygeal disorders, not elsewhere classified: Secondary | ICD-10-CM | POA: Insufficient documentation

## 2020-02-25 MED ORDER — HYDROCODONE-ACETAMINOPHEN 10-325 MG PO TABS: 1.0000 | ORAL_TABLET | Freq: Three times a day (TID) | ORAL | 0 refills | Status: DC | PRN

## 2020-02-25 NOTE — Progress Notes (Signed)
Patient: Vanessa Greer  Service Category: E/M  Provider: Gillis Santa, MD  DOB: 1961-04-30  DOS: 02/25/2020  Location: Office  MRN: [id number]  Setting: Ambulatory outpatient  Referring Provider: Adin Hector, MD  Type: Established Patient  Specialty: Interventional Pain Management  PCP: Adin Hector, MD  Location: Home  Delivery: TeleHealth     Virtual Encounter - Pain Management PROVIDER NOTE: Information contained herein reflects review and annotations entered in association with encounter. Interpretation of such information and data should be left to medically-trained personnel. Information provided to patient can be located elsewhere in the medical record under "Patient Instructions". Document created using STT-dictation technology, any transcriptional errors that may result from process are unintentional.    Contact & Pharmacy Preferred: [phone] Home: [phone] (home) Mobile: [phone] (mobile) E-mail: [email or web address]  [facility], [place] [address] Phone: [phone] Fax: [place], [place] [address] Phone: [phone] Fax: [phone]   Pre-screening  Vanessa Greer offered "in-person" vs "virtual" encounter. She indicated preferring virtual for this encounter.   Reason COVID-19*  Social distancing based on CDC and AMA recommendations.   I contacted Vanessa Greer on 02/25/2020 via telephone.      I clearly identified myself as Gillis Santa, MD. I verified that I was speaking with the correct person using two identifiers (Name: Vanessa Greer, and date of birth: March 03, 1961).  This visit was completed via telephone due to the restrictions of the COVID-19 pandemic. All issues as above were discussed and addressed but no physical exam was performed. If it was felt  that the patient should be evaluated in the office, they were directed there. The patient verbally consented to this visit. Patient was unable to complete an audio/visual visit due to Technical difficulties and/or Lack of internet. Due to the catastrophic nature of the COVID-19 pandemic, this visit was done through audio contact only.  Location of the patient: home address (see Epic for details)  Location of the provider: office  Consent I sought verbal advanced consent from Vanessa Greer for virtual visit interactions. I informed Vanessa Greer of possible security and privacy concerns, risks, and limitations associated with providing "not-in-person" medical evaluation and management services. I also informed Vanessa Greer of the availability of "in-person" appointments. Finally, I informed her that there would be a charge for the virtual visit and that she could be  personally, fully or partially, financially responsible for it. Vanessa Greer expressed understanding and agreed to proceed.   Historic Elements   Vanessa Greer is a 59 y.o. year old, female patient evaluated today after her last contact with our practice on 01/04/2020. Vanessa Greer  has a past medical history of Anxiety, Back pain, DDD (degenerative disc disease), lumbar, Depression, GERD (gastroesophageal reflux disease), High cholesterol, Lumbar radiculitis, and Pneumothorax (1982). She also  has a past surgical history that includes Abdominal hysterectomy; Foot surgery; Ectopic pregnancy surgery; Colonoscopy; Laser ablation condolamata (N/A, 12/13/2017); and Esophagogastroduodenoscopy (egd) with propofol (N/A, 03/17/2019). Vanessa Greer has a current medication list which includes the following prescription(s): vitamin d3, cyclobenzaprine, furosemide, [START ON 03/05/2020] hydrocodone-acetaminophen, [START ON 04/04/2020] hydrocodone-acetaminophen, [START ON 05/04/2020] hydrocodone-acetaminophen, lyrica, multivitamin with minerals, omega-3 fatty  acids, pantoprazole, sertraline, simvastatin, trazodone, and turmeric. She  reports that she has been  smoking cigarettes. She has a 20.50 pack-year smoking history. She has never used smokeless tobacco. She reports current alcohol use. She reports that she does not use drugs. Vanessa Greer is allergic to bupropion.   HPI  Today, she is being contacted for medication management.  No change in medical history since last visit.  Patient's pain is at baseline however is endorsing more pain in her sacral region.  No inciting event or fall.  She states that she has been sitting more..  Patient continues multimodal pain regimen as prescribed.  States that it provides pain relief and improvement in functional status.   Pharmacotherapy Assessment  Analgesic: 02/04/2020  1   01/04/2020  Hydrocodone-Acetamin 10-325 MG  90.00  30 Bi Lat   [id number]   Wal ([id number])   0  30.00 MME  Comm Ins   [place]    Monitoring: [place] PMP: PDMP reviewed during this encounter.       Pharmacotherapy: No side-effects or adverse reactions reported. Compliance: No problems identified. Effectiveness: Clinically acceptable. Plan: Refer to "POC".  UDS:  Summary  Date Value Ref Range Status  12/16/2018 FINAL  Final    Comment:    ==================================================================== TOXASSURE SELECT 13 (MW) ==================================================================== Test                             Result       Flag       Units Drug Present and Declared for Prescription Verification   Hydrocodone                    916          EXPECTED   ng/mg creat   Hydromorphone                  272          EXPECTED   ng/mg creat   Dihydrocodeine                 308          EXPECTED   ng/mg creat   Norhydrocodone                 2268         EXPECTED   ng/mg creat    Sources of hydrocodone include scheduled prescription    medications. Hydromorphone, dihydrocodeine and norhydrocodone are    expected metabolites of  hydrocodone. Hydromorphone and    dihydrocodeine are also available as scheduled prescription    medications. ==================================================================== Test                      Result    Flag   Units      Ref Range   Creatinine              25               mg/dL      >=20 ==================================================================== Declared Medications:  The flagging and interpretation on this report are based on the  following declared medications.  Unexpected results may arise from  inaccuracies in the declared medications.  **Note: The testing scope of this panel includes these medications:  Hydrocodone (Hydrocodone-Acetaminophen)  **Note: The testing scope of this panel does not include following  reported medications:  Acetaminophen (Hydrocodone-Acetaminophen)  Cyclobenzaprine  Multivitamin (MVI)  Omega-3 Fatty Acids  Pantoprazole  Pregabalin  Sertraline  Simvastatin  Turmeric  Vitamin D3 ==================================================================== For clinical consultation, please call [phone]. ====================================================================    Status:  Final result Visible to patient:  Yes (MyChart) Next appt:  None  Ref Range & Units 1 mo ago  Tricyclic, Ur Screen NONE DETECTED NONE DETECTED   Amphetamines, Ur Screen NONE DETECTED NONE DETECTED   MDMA (Ecstasy)Ur Screen NONE DETECTED NONE DETECTED   Cocaine Metabolite,Ur [place] NONE DETECTED NONE DETECTED   Opiate, Ur Screen NONE DETECTED POSITIVEAbnormal    Phencyclidine (PCP) Ur S NONE DETECTED NONE DETECTED   Cannabinoid 50 Ng, Ur [place] NONE DETECTED NONE DETECTED   Barbiturates, Ur Screen NONE DETECTED NONE DETECTED   Benzodiazepine, Ur Scrn NONE DETECTED NONE DETECTED   Methadone Scn, Ur NONE DETECTED NONE DETECTED          Laboratory Chemistry Profile   Renal No results found for: BUN, CREATININE, LABCREA, BCR, GFR, GFRAA, GFRNONAA,  LABVMA, EPIRU, EPINEPH24HUR, NOREPRU, NOREPI24HUR, DOPARU, [id number]  Hepatic No results found for: AST, ALT, ALBUMIN, ALKPHOS, HCVAB, AMYLASE, LIPASE, AMMONIA  Electrolytes No results found for: NA, K, CL, CALCIUM, MG, PHOS  Bone No results found for: VD25OH, TG256LS9HTD, [id number], [id number], 25OHVITD1, 25OHVITD2, 25OHVITD3, TESTOFREE, TESTOSTERONE  Inflammation (CRP: Acute Phase) (ESR: Chronic Phase) No results found for: CRP, ESRSEDRATE, LATICACIDVEN    Note: Above Lab results reviewed.  Imaging  MM 3D SCREEN BREAST BILATERAL CLINICAL DATA:  Screening.  EXAM: DIGITAL SCREENING BILATERAL MAMMOGRAM WITH TOMO AND CAD  COMPARISON:  Previous exam(s).  ACR Breast Density Category c: The breast tissue is heterogeneously dense, which may obscure small masses.  FINDINGS: There are no findings suspicious for malignancy. Images were processed with CAD.  IMPRESSION: No mammographic evidence of malignancy. A result letter of this screening mammogram will be mailed directly to the patient.  RECOMMENDATION: Screening mammogram in one year. (Code:SM-B-01Y)  BI-RADS CATEGORY  1: Negative.  Electronically Signed   By: Lovey Newcomer M.D.   On: 10/15/2019 12:30  Assessment  The primary encounter diagnosis was Coccyodynia. Diagnoses of Chronic pain syndrome, Lumbar facet arthropathy, and Lumbar spondylosis were also pertinent to this visit.  Plan of Care  Vanessa Greer has a current medication list which includes the following long-term medication(s): furosemide, lyrica, sertraline, simvastatin, and trazodone.  1. Chronic pain syndrome -Continue Lyrica 150 mg twice daily, managed by Dr. Caryl Comes.  Continue Flexeril as needed for muscle spasms -UDS up-to-date and appropriate - HYDROcodone-acetaminophen (NORCO) 10-325 MG tablet; Take 1 tablet by mouth every 8 (eight) hours as needed for severe pain. Must last 30 days. For chronic pain.  Dispense: 90 tablet; Refill: 0 -  HYDROcodone-acetaminophen (NORCO) 10-325 MG tablet; Take 1 tablet by mouth every 8 (eight) hours as needed for severe pain. Must last 30 days. For chronic pain.  Dispense: 90 tablet; Refill: 0 - HYDROcodone-acetaminophen (NORCO) 10-325 MG tablet; Take 1 tablet by mouth every 8 (eight) hours as needed for severe pain. Must last 30 days. For chronic pain.  Dispense: 90 tablet; Refill: 0  2. Coccyodynia -Patient endorsing increased elbow pain.  No inciting or traumatic event. - DG Sacrum/Coccyx; Future  3. Lumbar facet arthropathy -As needed lumbar facet radiofrequency ablation  4. Lumbar spondylosis -As needed lumbar facet radiofrequency ablation  Pharmacotherapy (Medications Ordered): Meds ordered this encounter  Medications  . HYDROcodone-acetaminophen (NORCO) 10-325 MG tablet    Sig: Take 1 tablet by mouth every 8 (eight) hours as needed for severe pain. Must last 30 days. For chronic pain.    Dispense:  90  tablet    Refill:  0    [place] STOP ACT - Not applicable. Fill one day early if pharmacy is closed on scheduled refill date.  [place] HYDROcodone-acetaminophen (NORCO) 10-325 MG tablet    Sig: Take 1 tablet by mouth every 8 (eight) hours as needed for severe pain. Must last 30 days. For chronic pain.    Dispense:  90 tablet    Refill:  0    [place] STOP ACT - Not applicable. Fill one day early if pharmacy is closed on scheduled refill date.  [place] Kitchen HYDROcodone-acetaminophen (NORCO) 10-325 MG tablet    Sig: Take 1 tablet by mouth every 8 (eight) hours as needed for severe pain. Must last 30 days. For chronic pain.    Dispense:  90 tablet    Refill:  0    [place] STOP ACT - Not applicable. Fill one day early if pharmacy is closed on scheduled refill date.   Orders:  Orders Placed This Encounter  Procedures  . DG Sacrum/Coccyx    Standing Status:   Future    Standing Expiration Date:   04/24/2021    Order Specific Question:   Reason for Exam (SYMPTOM  OR DIAGNOSIS REQUIRED)    Answer:   sacral pain     Order Specific Question:   Is patient pregnant?    Answer:   No    Order Specific Question:   Preferred imaging location?    Answer:   [facility]    Order Specific Question:   Radiology Contrast Protocol - do NOT remove file path    Answer:   \\[email or web address]s.pdf   Follow-up plan:   Return in about 3 months (around 05/24/2020) for Medication Management, in person.     status post left L3, L4, L5, S1 RFA on 07/06/2019 and right L3, L4, L5, S1 RFA on 07/22/2019- helped, repeat PRN    Recent Visits Date Type Provider Dept  01/04/20 Office Visit Gillis Santa, MD [facility]  Showing recent visits within past 90 days and meeting all other requirements   Today's Visits Date Type Provider Dept  02/25/20 Office Visit Gillis Santa, MD [facility]  Showing today's visits and meeting all other requirements   Future Appointments No visits were found meeting these conditions.  Showing future appointments within next 90 days and meeting all other requirements   I discussed the assessment and treatment plan with the patient. The patient was provided an opportunity to ask questions and all were answered. The patient agreed with the plan and demonstrated an understanding of the instructions.  Patient advised to call back or seek an in-person evaluation if the symptoms or condition worsens.  Duration of encounter: 25 minutes.  Note by: Gillis Santa, MD Date: 02/25/2020; Time: 1:57 PM

## 2020-03-23 ENCOUNTER — Ambulatory Visit
Admission: RE | Admit: 2020-03-23 | Discharge: 2020-03-23 | Disposition: A | Discharge status: Home / Self Care | Payer: BC Managed Care – PPO | Source: Ambulatory Visit | Attending: Student in an Organized Health Care Education/Training Program | Admitting: Student in an Organized Health Care Education/Training Program

## 2020-03-23 DIAGNOSIS — M533 Sacrococcygeal disorders, not elsewhere classified: Secondary | ICD-10-CM

## 2020-05-24 ENCOUNTER — Ambulatory Visit
Payer: BC Managed Care – PPO | Attending: Student in an Organized Health Care Education/Training Program | Admitting: Student in an Organized Health Care Education/Training Program

## 2020-05-24 DIAGNOSIS — G894 Chronic pain syndrome: Secondary | ICD-10-CM

## 2020-05-24 DIAGNOSIS — M533 Sacrococcygeal disorders, not elsewhere classified: Secondary | ICD-10-CM

## 2020-05-24 DIAGNOSIS — M5136 Other intervertebral disc degeneration, lumbar region: Secondary | ICD-10-CM

## 2020-05-24 DIAGNOSIS — M47816 Spondylosis without myelopathy or radiculopathy, lumbar region: Secondary | ICD-10-CM

## 2020-05-24 DIAGNOSIS — M51369 Other intervertebral disc degeneration, lumbar region without mention of lumbar back pain or lower extremity pain: Secondary | ICD-10-CM

## 2020-05-24 DIAGNOSIS — Z79899 Other long term (current) drug therapy: Secondary | ICD-10-CM

## 2020-05-24 MED ORDER — HYDROCODONE-ACETAMINOPHEN 10-325 MG PO TABS: 1.0000 | ORAL_TABLET | Freq: Three times a day (TID) | ORAL | 0 refills | Status: DC | PRN

## 2020-05-24 NOTE — Progress Notes (Signed)
Patient: Vanessa Greer  Service Category: E/M  Provider: Gillis Santa, MD  DOB: [birth year]  DOS: 05/24/2020  Location: Office  MRN: [id number]  Setting: Ambulatory outpatient  Referring Provider: Adin Hector, MD  Type: Established Patient  Specialty: Interventional Pain Management  PCP: Adin Hector, MD  Location: Home  Delivery: TeleHealth     Virtual Encounter - Pain Management PROVIDER NOTE: Information contained herein reflects review and annotations entered in association with encounter. Interpretation of such information and data should be left to medically-trained personnel. Information provided to patient can be located elsewhere in the medical record under "Patient Instructions". Document created using STT-dictation technology, any transcriptional errors that may result from process are unintentional.    Contact & Pharmacy Preferred: [phone] Home: [phone] (home) Mobile: [phone] (mobile) E-mail: [email or web address]  [facility], [place] [address] Phone: [phone] Fax: [place], [place] [address] Phone: [phone] Fax: [phone]   Pre-screening  Vanessa Greer offered "in-person" vs "virtual" encounter. She indicated preferring virtual for this encounter.   Reason COVID-19*  Social distancing based on CDC and AMA recommendations.   I contacted Vanessa Greer on 05/24/2020 via video conference.      I clearly identified myself as Gillis Santa, MD. I verified that I was speaking with the correct person using two identifiers (Name: Vanessa Greer, and date of birth: 1961/02/21).  Consent I sought verbal advanced consent from Vanessa Greer for virtual visit interactions. I informed Vanessa Greer of possible security and privacy concerns, risks, and limitations  associated with providing "not-in-person" medical evaluation and management services. I also informed Vanessa Greer of the availability of "in-person" appointments. Finally, I informed her that there would be a charge for the virtual visit and that she could be  personally, fully or partially, financially responsible for it. Ms. Shew expressed understanding and agreed to proceed.   Historic Elements   Vanessa Greer is a 59 y.o. year old, female patient evaluated today after her last contact with our practice on 02/25/2020. Vanessa Greer  has a past medical history of Anxiety, Back pain, DDD (degenerative disc disease), lumbar, Depression, GERD (gastroesophageal reflux disease), High cholesterol, Lumbar radiculitis, and Pneumothorax (1982). She also  has a past surgical history that includes Abdominal hysterectomy; Foot surgery; Ectopic pregnancy surgery; Colonoscopy; Laser ablation condolamata (N/A, 12/13/2017); and Esophagogastroduodenoscopy (egd) with propofol (N/A, 03/17/2019). Vanessa Greer has a current medication list which includes the following prescription(s): vitamin d3, cyclobenzaprine, furosemide, [START ON 06/05/2020] hydrocodone-acetaminophen, [START ON 07/05/2020] hydrocodone-acetaminophen, [START ON 08/04/2020] hydrocodone-acetaminophen, lyrica, multivitamin with minerals, omega-3 fatty acids, pantoprazole, sertraline, simvastatin, trazodone, and turmeric. She  reports that she has been smoking cigarettes. She has a 20.50 pack-year smoking history. She has never used smokeless tobacco. She reports current alcohol use. She reports that she does not use drugs. Vanessa Greer is allergic to bupropion.   HPI  Today, she is being contacted for medication management.   Continue to have persistent low back pain.  X-ray of sacrum/coccyx was negative.  States that she is not experiencing any current pain relief from the lumbar radiofrequency ablation that she had in July 2020.  We discussed Sprint peripheral  nerve stimulation of medial branch nerves.  Patient will look into this further.  We also discussed the importance of physical therapy and keeping mobile.  Otherwise we will refill her hydrocodone as below.  Continue Flexeril as needed, Lyrica 150 mg twice daily.  Continue Zoloft as prescribed.  Patient is on low-dose Flexeril and only takes as needed but did counsel her on the risk of serotonin syndrome with Zoloft and more frequent Flexeril use.  Patient with understanding.  Pharmacotherapy Assessment  Analgesic:  05/06/2020  1   02/25/2020  Hydrocodone-Acetamin 10-325 MG  90.00  30 Bi Lat   [id number]   Wal (5798)   0  30.00 MME  Comm Ins   [place]    Monitoring: [place] PMP: PDMP reviewed during this encounter.       Pharmacotherapy: No side-effects or adverse reactions reported. Compliance: No problems identified. Effectiveness: Clinically acceptable. Plan: Refer to "POC".  UDS:  Summary  Date Value Ref Range Status  12/16/2018 FINAL  Final    Comment:    ==================================================================== TOXASSURE SELECT 13 (MW) ==================================================================== Test                             Result       Flag       Units Drug Present and Declared for Prescription Verification   Hydrocodone                    916          EXPECTED   ng/mg creat   Hydromorphone                  272          EXPECTED   ng/mg creat   Dihydrocodeine                 308          EXPECTED   ng/mg creat   Norhydrocodone                 2268         EXPECTED   ng/mg creat    Sources of hydrocodone include scheduled prescription    medications. Hydromorphone, dihydrocodeine and norhydrocodone are    expected metabolites of hydrocodone. Hydromorphone and    dihydrocodeine are also available as scheduled prescription    medications. ==================================================================== Test                      Result    Flag   Units      Ref  Range   Creatinine              25               mg/dL      >=20 ==================================================================== Declared Medications:  The flagging and interpretation on this report are based on the  following declared medications.  Unexpected results may arise from  inaccuracies in the declared medications.  **Note: The testing scope of this panel includes these medications:  Hydrocodone (Hydrocodone-Acetaminophen)  **Note: The testing scope of this panel does not include following  reported medications:  Acetaminophen (Hydrocodone-Acetaminophen)  Cyclobenzaprine  Multivitamin (MVI)  Omega-3 Fatty Acids  Pantoprazole  Pregabalin  Sertraline  Simvastatin  Turmeric  Vitamin D3 ==================================================================== For clinical consultation, please call [phone]. ====================================================================    Laboratory Chemistry Profile   Renal No results found for: BUN, CREATININE, LABCREA, BCR, GFR, GFRAA, GFRNONAA, LABVMA, EPIRU, [id number], NOREPRU, NOREPI24HUR, DOPARU, [id number]  Hepatic No results found for: AST, ALT, ALBUMIN, ALKPHOS, HCVAB, AMYLASE, LIPASE, AMMONIA   Electrolytes No results found for: NA, K, CL, CALCIUM, MG, PHOS   Bone No results found for: VD25OH, GE366QH4TML, [id number], KC1275TZ0, 25OHVITD1, 25OHVITD2, 25OHVITD3, TESTOFREE, TESTOSTERONE   Inflammation (CRP: Acute Phase) (ESR: Chronic Phase) No results found for: CRP, ESRSEDRATE, LATICACIDVEN     Note: Above Lab results reviewed.  Imaging  DG Sacrum/Coccyx CLINICAL DATA:  Sacral pain.  EXAM: SACRUM AND COCCYX - 2+ VIEW  COMPARISON:  None.  FINDINGS: There is no evidence of fracture or other focal bone lesions.  IMPRESSION: Negative.  Electronically Signed   By: Constance Holster M.D.   On: 03/23/2020 23:01  Assessment  The primary encounter diagnosis was Coccyodynia. Diagnoses of Chronic  pain syndrome, Lumbar facet arthropathy, Lumbar spondylosis, Lumbar degenerative disc disease, and Controlled substance agreement signed were also pertinent to this visit.  Plan of Care  Ms. CANIYA TAGLE has a current medication list which includes the following long-term medication(s): furosemide, lyrica, sertraline, simvastatin, and trazodone.  Consider sprint peripheral nerve stimulation of medial branch given that she has not responded to lumbar radiofrequency ablation.  Refill medications as below.  Continue Lyrica as prescribed.  Pharmacotherapy (Medications Ordered): Meds ordered this encounter  Medications  . HYDROcodone-acetaminophen (NORCO) 10-325 MG tablet    Sig: Take 1 tablet by mouth every 8 (eight) hours as needed for severe pain. Must last 30 days. For chronic pain.    Dispense:  90 tablet    Refill:  0    [place] STOP ACT - Not applicable. Fill one day early if pharmacy is closed on scheduled refill date.  [place] HYDROcodone-acetaminophen (NORCO) 10-325 MG tablet    Sig: Take 1 tablet by mouth every 8 (eight) hours as needed for severe pain. Must last 30 days. For chronic pain.    Dispense:  90 tablet    Refill:  0    [place] STOP ACT - Not applicable. Fill one day early if pharmacy is closed on scheduled refill date.  [place] HYDROcodone-acetaminophen (NORCO) 10-325 MG tablet    Sig: Take 1 tablet by mouth every 8 (eight) hours as needed for severe pain. Must last 30 days. For chronic pain.    Dispense:  90 tablet    Refill:  0     STOP ACT - Not applicable. Fill one day early if pharmacy is closed on scheduled refill date.   Orders:  No orders of the defined types were placed in this encounter.  Follow-up plan:   Return in about 3 months (around 08/24/2020) for Medication Management, in person.     status post left L3, L4, L5, S1 RFA on 07/06/2019 and right L3, L4, L5, S1 RFA on 07/22/2019-  Recent Visits Date Type Provider Dept  02/25/20 Office Visit Gillis Santa, MD [facility]  Showing recent visits within past 90 days and meeting all other requirements   Today's Visits Date Type Provider Dept  05/24/20 Telemedicine Gillis Santa, MD [facility]  Showing today's visits and meeting all other requirements   Future Appointments No visits were found meeting these conditions.  Showing future appointments within next 90 days and meeting all other requirements   I discussed the assessment and treatment plan with the patient. The patient was provided an opportunity to ask questions and all were answered. The patient agreed with the plan and demonstrated an understanding of the instructions.  Patient advised to call back or seek  an in-person evaluation if the symptoms or condition worsens.  Duration of encounter: 30 minutes.  Note by: Gillis Santa, MD Date: 05/24/2020; Time: 9:40 AM

## 2020-08-23 ENCOUNTER — Other Ambulatory Visit: Payer: Self-pay

## 2020-08-23 ENCOUNTER — Ambulatory Visit
Payer: BC Managed Care – PPO | Attending: Student in an Organized Health Care Education/Training Program | Admitting: Student in an Organized Health Care Education/Training Program

## 2020-08-23 ENCOUNTER — Encounter: Payer: Self-pay | Admitting: Student in an Organized Health Care Education/Training Program

## 2020-08-23 VITALS — BP 147/94 | HR 62 | Temp 97.2°F | Resp 16 | Ht 68.0 in | Wt 165.0 lb

## 2020-08-23 DIAGNOSIS — M5136 Other intervertebral disc degeneration, lumbar region: Secondary | ICD-10-CM

## 2020-08-23 DIAGNOSIS — M5416 Radiculopathy, lumbar region: Secondary | ICD-10-CM | POA: Diagnosis present

## 2020-08-23 DIAGNOSIS — M533 Sacrococcygeal disorders, not elsewhere classified: Secondary | ICD-10-CM

## 2020-08-23 DIAGNOSIS — Z79899 Other long term (current) drug therapy: Secondary | ICD-10-CM | POA: Diagnosis present

## 2020-08-23 DIAGNOSIS — G894 Chronic pain syndrome: Secondary | ICD-10-CM

## 2020-08-23 DIAGNOSIS — M47816 Spondylosis without myelopathy or radiculopathy, lumbar region: Secondary | ICD-10-CM | POA: Diagnosis not present

## 2020-08-23 MED ORDER — HYDROCODONE-ACETAMINOPHEN 10-325 MG PO TABS: 1.0000 | ORAL_TABLET | Freq: Three times a day (TID) | ORAL | 0 refills | Status: DC | PRN

## 2020-08-23 NOTE — Patient Instructions (Signed)
Instructed to get xrays and MRI.   You have 3 prescriptions for Hydrocodone that have been sent to the pharmacy.

## 2020-08-23 NOTE — Progress Notes (Signed)
Nursing Pain Medication Assessment:  Safety precautions to be maintained throughout the outpatient stay will include: orient to surroundings, keep bed in low position, maintain call bell within reach at all times, provide assistance with transfer out of bed and ambulation.  Medication Inspection Compliance: Pill count conducted under aseptic conditions, in front of the patient. Neither the pills nor the bottle was removed from the patient's sight at any time. Once count was completed pills were immediately returned to the patient in their original bottle.  Medication: Hydrocodone/APAP Pill/Patch Count: 41 of 90 pills remain Pill/Patch Appearance: Markings consistent with prescribed medication Bottle Appearance: Standard pharmacy container. Clearly labeled. Filled Date: 08 /06 / 2021 Last Medication intake:  Today

## 2020-08-23 NOTE — Progress Notes (Signed)
PROVIDER NOTE: Information contained herein reflects review and annotations entered in association with encounter. Interpretation of such information and data should be left to medically-trained personnel. Information provided to patient can be located elsewhere in the medical record under "Patient Instructions". Document created using STT-dictation technology, any transcriptional errors that may result from process are unintentional.    Patient: Vanessa Greer  Service Category: E/M  Provider: Edward Jolly, MD  DOB: Aug 11, 1961  DOS: 08/23/2020  Specialty: Interventional Pain Management  MRN: [id number]  Setting: Ambulatory outpatient  PCP: Vanessa Ferrier, MD  Type: Established Patient    Referring Provider: Lynnea Ferrier, MD  Location: Office  Delivery: Face-to-face     HPI  Reason for encounter: [name], a 59 y.o. year old female, is here today for evaluation and management of her Chronic pain syndrome [G89.4]. [name] primary complain today is Back Pain (low) Last encounter: Practice (02/25/2020). My last encounter with her was on 02/25/2020. Pertinent problems: [name] has Chronic bilateral low back pain with bilateral sciatica; Lumbar facet arthropathy; Lumbar spondylosis; Lumbar degenerative disc disease; Chronic pain syndrome; and Coccyodynia on their pertinent problem list. Pain Assessment: Severity of Chronic pain is reported as a 4 /10. Location: Back Lower/radiates to both hips. Onset: More than a month ago. Quality: Aching. Timing: Intermittent. Modifying factor(s): showers and medications. Vitals:  height is 5\' 8"  (1.727 m) and weight is 165 lb (74.8 kg). Her temperature is 97.2 F (36.2 C) (abnormal). Her blood pressure is 147/94 (abnormal) and her pulse is 62. Her respiration is 16 and oxygen saturation is 99%.   Patient presents today for medication management and worsening pain symptoms.  She is endorsing bilateral buttock pain as well as intermittent  radiation into her groin.  She states that this has been worsening over the last 3 to 4 weeks.  No inciting event.  She is also endorsing left calf pain that is making it difficult for her to walk.  She continues her hydrocodone as prescribed.  She endorses that it does provide analgesic benefit.  She continues Lyrica as prescribed by her PCP, Dr. .  Pharmacotherapy Assessment   08/05/2020  1   05/24/2020  Hydrocodone-Acetamin 10-325 MG  90.00  30 Bi Lat   05/26/2020   [id number])   0/0  30.00 MME  Comm Ins   [place]      Monitoring: Why PMP: PDMP reviewed during this encounter.       Pharmacotherapy: No side-effects or adverse reactions reported. Compliance: No problems identified. Effectiveness: Clinically acceptable.  [id number], RN  08/23/2020  8:14 AM  Signed Nursing Pain Medication Assessment:  Safety precautions to be maintained throughout the outpatient stay will include: orient to surroundings, keep bed in low position, maintain call bell within reach at all times, provide assistance with transfer out of bed and ambulation.  Medication Inspection Compliance: Pill count conducted under aseptic conditions, in front of the patient. Neither the pills nor the bottle was removed from the patient's sight at any time. Once count was completed pills were immediately returned to the patient in their original bottle.  Medication: Hydrocodone/APAP Pill/Patch Count: 41 of 90 pills remain Pill/Patch Appearance: Markings consistent with prescribed medication Bottle Appearance: Standard pharmacy container. Clearly labeled. Filled Date: 08 /06 / 2021 Last Medication intake:  Today    UDS:  Summary  Date Value Ref Range Status  12/16/2018 FINAL  Final    Comment:    ====================================================================  TOXASSURE SELECT 13 (MW) ==================================================================== Test                             Result       Flag       Units Drug  Present and Declared for Prescription Verification   Hydrocodone                    916          EXPECTED   ng/mg creat   Hydromorphone                  272          EXPECTED   ng/mg creat   Dihydrocodeine                 308          EXPECTED   ng/mg creat   Norhydrocodone                 2268         EXPECTED   ng/mg creat    Sources of hydrocodone include scheduled prescription    medications. Hydromorphone, dihydrocodeine and norhydrocodone are    expected metabolites of hydrocodone. Hydromorphone and    dihydrocodeine are also available as scheduled prescription    medications. ==================================================================== Test                      Result    Flag   Units      Ref Range   Creatinine              25               mg/dL      >=16 ==================================================================== Declared Medications:  The flagging and interpretation on this report are based on the  following declared medications.  Unexpected results may arise from  inaccuracies in the declared medications.  **Note: The testing scope of this panel includes these medications:  Hydrocodone (Hydrocodone-Acetaminophen)  **Note: The testing scope of this panel does not include following  reported medications:  Acetaminophen (Hydrocodone-Acetaminophen)  Cyclobenzaprine  Multivitamin (MVI)  Omega-3 Fatty Acids  Pantoprazole  Pregabalin  Sertraline  Simvastatin  Turmeric  Vitamin D3 ==================================================================== For clinical consultation, please call [phone]. ====================================================================      ROS  Constitutional: Denies any fever or chills Gastrointestinal: No reported hemesis, hematochezia, vomiting, or acute GI distress Musculoskeletal: Buttock pain, groin pain, left calf pain, low back pain Neurological: No reported episodes of acute onset apraxia, aphasia, dysarthria,  agnosia, amnesia, paralysis, loss of coordination, or loss of consciousness  Medication Review  HYDROcodone-acetaminophen, Omega-3 Fatty Acids, Turmeric, Vitamin D3, cyclobenzaprine, furosemide, multivitamin with minerals, pantoprazole, pregabalin, sertraline, simvastatin, and traZODone  History Review  Allergy: [name] is allergic to bupropion. Drug: [name]  reports no history of drug use. Alcohol:  reports current alcohol use. Tobacco:  reports that she has been smoking cigarettes. She has a 20.50 pack-year smoking history. She has never used smokeless tobacco. Social: [name]  reports that she has been smoking cigarettes. She has a 20.50 pack-year smoking history. She has never used smokeless tobacco. She reports current alcohol use. She reports that she does not use drugs. Medical:  has a past medical history of Anxiety, Back pain, DDD (degenerative disc disease), lumbar, Depression, GERD (gastroesophageal reflux disease), High cholesterol, Lumbar radiculitis, and Pneumothorax (1982). Surgical: Ms.  Vanessa Greer  has a past surgical history that includes Abdominal hysterectomy; Foot surgery; Ectopic pregnancy surgery; Colonoscopy; Laser ablation condolamata (N/A, 12/13/2017); and Esophagogastroduodenoscopy (egd) with propofol (N/A, 03/17/2019). Family: family history includes Breast cancer (age of onset: 56) in her mother.  Recent Imaging Review  DG Sacrum/Coccyx CLINICAL DATA:  Sacral pain.  EXAM: SACRUM AND COCCYX - 2+ VIEW  COMPARISON:  None.  FINDINGS: There is no evidence of fracture or other focal bone lesions.  IMPRESSION: Negative.  Electronically Signed   By: Katherine Mantle M.D.   On: 03/23/2020 23:01 Note: Reviewed        Physical Exam  General appearance: Well nourished, well developed, and well hydrated. In no apparent acute distress Mental status: Alert, oriented x 3 (person, place, & time)       Respiratory: No evidence of acute respiratory  distress Eyes: PERLA Vitals: BP (!) 147/94   Pulse 62   Temp (!) 97.2 F (36.2 C)   Resp 16   Ht 5\' 8"  (1.727 m)   Wt 165 lb (74.8 kg)   SpO2 99%   BMI 25.09 kg/m  BMI: Estimated body mass index is 25.09 kg/m as calculated from the following:   Height as of this encounter: 5\' 8"  (1.727 m).   Weight as of this encounter: 165 lb (74.8 kg). Ideal: Ideal body weight: 63.9 kg (140 lb 14 oz) Adjusted ideal body weight: 68.3 kg (150 lb 8.4 oz)  Lumbar Spine Area Exam  Skin & Axial Inspection: No masses, redness, or swelling Alignment: Symmetrical Functional ROM: Pain restricted ROM affecting both sides Stability: No instability detected Muscle Tone/Strength: Functionally intact. No obvious neuro-muscular anomalies detected. Sensory (Neurological): Musculoskeletal pain pattern Palpation: No palpable anomalies       Provocative Tests: Hyperextension/rotation test: deferred today       Lumbar quadrant test (Kemp's test): deferred today       Lateral bending test: deferred today       Patrick's Maneuver: Non-diagnostic                   FABER* test: Non-diagnostic                   S-I anterior distraction/compression test: deferred today         S-I lateral compression test: deferred today         S-I Thigh-thrust test: deferred today         S-I Gaenslen's test: deferred today         *(Flexion, ABduction and External Rotation)  Lower Extremity Exam    Side: Right lower extremity  Side: Left lower extremity  Stability: No instability observed          Stability: No instability observed          Skin & Extremity Inspection: Skin color, temperature, and hair growth are WNL. No peripheral edema or cyanosis. No masses, redness, swelling, asymmetry, or associated skin lesions. No contractures.  Skin & Extremity Inspection: Skin color, temperature, and hair growth are WNL. No peripheral edema or cyanosis. No masses, redness, swelling, asymmetry, or associated skin lesions. No contractures.   Functional ROM: Unrestricted ROM                  Functional ROM: Unrestricted ROM                  Muscle Tone/Strength: Functionally intact. No obvious neuro-muscular anomalies detected.  Muscle Tone/Strength: Functionally intact. No obvious neuro-muscular anomalies  detected.  Sensory (Neurological): Unimpaired        Sensory (Neurological): Musculoskeletal pain pattern left calf pain        DTR: Patellar: deferred today Achilles: deferred today Plantar: deferred today  DTR: Patellar: deferred today Achilles: deferred today Plantar: deferred today  Palpation: No palpable anomalies  Palpation: No palpable anomalies    Assessment   Status Diagnosis  Persistent Persistent Persistent 1. Chronic pain syndrome   2. Coccyodynia   3. Lumbar spondylosis   4. Lumbar degenerative disc disease   5. Controlled substance agreement signed   6. Sacroiliac joint pain   7. Lumbar radiculopathy   8. Other intervertebral disc degeneration, lumbar region      Updated Problems: Problem  Coccyodynia  Lumbar Facet Arthropathy  Lumbar Spondylosis  Lumbar Degenerative Disc Disease  Chronic Pain Syndrome  Chronic Bilateral Low Back Pain With Bilateral Sciatica   Overview:  Previously evaluated by physiatry and neurosurgery (Dr. Lovell SheehanJenkins).     Plan of Care   1.  Diagnostic sacroiliac joint x-ray for buttock/groin pain.  Consider diagnostic SI joint injection. 2.  For left calf pain that is making it difficult for her to walk, recommend repeat lumbar MRI as patient's previous one was over 3 years ago and did show disc herniation and minor pathology on the left side at L4-L5 and L5-S1 which is a side of the patient is symptomatic 1. 3.  Refill hydrocodone as below.  Continue Lyrica as prescribed as well as Flexeril as needed. 4.  Patient counseled on smoking cessation. Patient has chronic pain. Studies show that people with chronic pain tend to be heavier smokers and often smoke in response to  pain. Chronic pain makes smoking cessation more difficult and increases the risk of relapse after quitting.  Pt and I discussed the importance of managing pain during the process of smoking cessation.  We have developed a plan for smoking cessation and relapse prevention that includes management of of smoking in response to pain. Smoking cessation instruction/counseling given:  counseled patient on the dangers of tobacco use, advised patient to stop smoking, and reviewed strategies to maximize success  Pharmacotherapy (Medications Ordered): Meds ordered this encounter  Medications  . HYDROcodone-acetaminophen (NORCO) 10-325 MG tablet    Sig: Take 1 tablet by mouth every 8 (eight) hours as needed for severe pain. Must last 30 days. For chronic pain.    Dispense:  90 tablet    Refill:  0    [place] STOP ACT - Not applicable. Fill one day early if pharmacy is closed on scheduled refill date.  Marland Kitchen. HYDROcodone-acetaminophen (NORCO) 10-325 MG tablet    Sig: Take 1 tablet by mouth every 8 (eight) hours as needed for severe pain. Must last 30 days. For chronic pain.    Dispense:  90 tablet    Refill:  0    [place] STOP ACT - Not applicable. Fill one day early if pharmacy is closed on scheduled refill date.  Marland Kitchen. HYDROcodone-acetaminophen (NORCO) 10-325 MG tablet    Sig: Take 1 tablet by mouth every 8 (eight) hours as needed for severe pain. Must last 30 days. For chronic pain.    Dispense:  90 tablet    Refill:  0    [place] STOP ACT - Not applicable. Fill one day early if pharmacy is closed on scheduled refill date.   Orders:  Orders Placed This Encounter  Procedures  . DG Si Joints    Standing Status:   Future  Standing Expiration Date:   11/23/2020    Order Specific Question:   Reason for Exam (SYMPTOM  OR DIAGNOSIS REQUIRED)    Answer:   Sacroiliac joint pain    Order Specific Question:   Is the patient pregnant?    Answer:   No    Order Specific Question:   Preferred imaging location?    Answer:    [facility]    Order Specific Question:   Call Results- Best Contact Number?    Answer:   [phone] [facility])  . MR LUMBAR SPINE WO CONTRAST    Patient presents with axial pain with possible radicular component.  In addition to any acute findings, please report on:  1. Facet (Zygapophyseal) joint DJD (Hypertrophy, space narrowing, subchondral sclerosis, and/or osteophyte formation) 2. DDD and/or IVDD (Loss of disc height, desiccation or "Black disc disease") 3. Pars defects 4. Spondylolisthesis, spondylosis, and/or spondyloarthropathies (include Degree/Grade of displacement in mm) 5. Vertebral body Fractures, including age (old, new/acute) 6. Modic Type Changes 7. Demineralization 8. Bone pathology 9. Central, Lateral Recess, and/or Foraminal Stenosis (include AP diameter of stenosis in mm) 10. Surgical changes (hardware type, status, and presence of fibrosis)  NOTE: Please specify level(s) and laterality.    Standing Status:   Future    Standing Expiration Date:   11/23/2020    Order Specific Question:   What is the patient's sedation requirement?    Answer:   No Sedation    Order Specific Question:   Does the patient have a pacemaker or implanted devices?    Answer:   No    Order Specific Question:   Preferred imaging location?    Answer:   [facility] (table limit-350lbs)    Order Specific Question:   Call Results- Best Contact Number?    Answer:   [phone] [facility])    Order Specific Question:   Radiology Contrast Protocol - do NOT remove file path    Answer:   [email or web address]  . ToxASSURE Select 13 (MW), Urine    Volume: 30 ml(s). Minimum 3 ml of urine is needed. Document temperature of fresh sample. Indications: Long term (current) use of opiate analgesic [phone])    Order Specific Question:   Release to patient    Answer:   Immediate   Follow-up plan:   Return in about 3 months (around 11/23/2020)  for Medication Management, in person.     status post left L3, L4, L5, S1 RFA on 07/06/2019 and right L3, L4, L5, S1 RFA on 07/22/2019-   Recent Visits No visits were found meeting these conditions. Showing recent visits within past 90 days and meeting all other requirements Today's Visits Date Type Provider Dept  08/23/20 Office Visit Vanessa Jolly, MD [facility]  Showing today's visits and meeting all other requirements Future Appointments No visits were found meeting these conditions. Showing future appointments within next 90 days and meeting all other requirements  I discussed the assessment and treatment plan with the patient. The patient was provided an opportunity to ask questions and all were answered. The patient agreed with the plan and demonstrated an understanding of the instructions.  Patient advised to call back or seek an in-person evaluation if the symptoms or condition worsens.  Duration of encounter: 35 minutes.  Note by: Vanessa Jolly, MD Date: 08/23/2020; Time: 8:35 AM

## 2020-08-25 ENCOUNTER — Telehealth: Payer: Self-pay | Admitting: *Deleted

## 2020-08-26 LAB — TOXASSURE SELECT 13 (MW), URINE

## 2020-09-10 ENCOUNTER — Telehealth: Payer: Self-pay | Admitting: *Deleted

## 2020-09-10 NOTE — Telephone Encounter (Signed)
Ms. Bosko notified that lung cancer screening imaging is due currently or in the near future. Patient currently smokes a half pack of cigarettes per day. Appointment scheduled for 09/29/2020 at 4:30 pm.

## 2020-09-13 ENCOUNTER — Other Ambulatory Visit: Payer: Self-pay

## 2020-09-13 ENCOUNTER — Ambulatory Visit
Admission: RE | Admit: 2020-09-13 | Discharge: 2020-09-13 | Disposition: A | Discharge status: Home / Self Care | Payer: BC Managed Care – PPO | Source: Ambulatory Visit | Attending: Student in an Organized Health Care Education/Training Program | Admitting: Student in an Organized Health Care Education/Training Program

## 2020-09-13 ENCOUNTER — Other Ambulatory Visit: Payer: Self-pay | Admitting: *Deleted

## 2020-09-13 DIAGNOSIS — M5136 Other intervertebral disc degeneration, lumbar region: Secondary | ICD-10-CM | POA: Diagnosis present

## 2020-09-13 DIAGNOSIS — M5416 Radiculopathy, lumbar region: Secondary | ICD-10-CM | POA: Insufficient documentation

## 2020-09-13 DIAGNOSIS — M533 Sacrococcygeal disorders, not elsewhere classified: Secondary | ICD-10-CM

## 2020-09-13 DIAGNOSIS — Z87891 Personal history of nicotine dependence: Secondary | ICD-10-CM

## 2020-09-13 DIAGNOSIS — Z122 Encounter for screening for malignant neoplasm of respiratory organs: Secondary | ICD-10-CM

## 2020-09-13 NOTE — Progress Notes (Signed)
Current smoker, 37 pack year

## 2020-09-29 ENCOUNTER — Ambulatory Visit
Admission: RE | Admit: 2020-09-29 | Discharge: 2020-09-29 | Disposition: A | Discharge status: Home / Self Care | Payer: BC Managed Care – PPO | Source: Ambulatory Visit | Attending: Nurse Practitioner | Admitting: Nurse Practitioner

## 2020-09-29 ENCOUNTER — Other Ambulatory Visit: Payer: Self-pay

## 2020-09-29 ENCOUNTER — Other Ambulatory Visit: Payer: Self-pay | Admitting: Internal Medicine

## 2020-09-29 DIAGNOSIS — Z122 Encounter for screening for malignant neoplasm of respiratory organs: Secondary | ICD-10-CM | POA: Diagnosis present

## 2020-09-29 DIAGNOSIS — Z1231 Encounter for screening mammogram for malignant neoplasm of breast: Secondary | ICD-10-CM

## 2020-09-29 DIAGNOSIS — Z87891 Personal history of nicotine dependence: Secondary | ICD-10-CM | POA: Diagnosis not present

## 2020-10-03 ENCOUNTER — Encounter: Payer: Self-pay | Admitting: *Deleted

## 2020-10-20 ENCOUNTER — Ambulatory Visit
Admission: RE | Admit: 2020-10-20 | Discharge: 2020-10-20 | Disposition: A | Discharge status: Home / Self Care | Payer: BC Managed Care – PPO | Source: Ambulatory Visit | Attending: Internal Medicine | Admitting: Internal Medicine

## 2020-10-20 ENCOUNTER — Other Ambulatory Visit: Payer: Self-pay

## 2020-10-20 DIAGNOSIS — Z1231 Encounter for screening mammogram for malignant neoplasm of breast: Secondary | ICD-10-CM

## 2020-10-28 ENCOUNTER — Other Ambulatory Visit: Payer: Self-pay

## 2020-11-10 ENCOUNTER — Other Ambulatory Visit: Payer: Self-pay

## 2020-11-10 ENCOUNTER — Encounter: Payer: Self-pay | Admitting: Student in an Organized Health Care Education/Training Program

## 2020-11-10 ENCOUNTER — Telehealth: Payer: Self-pay

## 2020-11-10 ENCOUNTER — Ambulatory Visit
Payer: BC Managed Care – PPO | Attending: Student in an Organized Health Care Education/Training Program | Admitting: Student in an Organized Health Care Education/Training Program

## 2020-11-10 DIAGNOSIS — M5136 Other intervertebral disc degeneration, lumbar region: Secondary | ICD-10-CM

## 2020-11-10 DIAGNOSIS — G894 Chronic pain syndrome: Secondary | ICD-10-CM

## 2020-11-10 DIAGNOSIS — M47816 Spondylosis without myelopathy or radiculopathy, lumbar region: Secondary | ICD-10-CM | POA: Diagnosis not present

## 2020-11-10 DIAGNOSIS — Z79899 Other long term (current) drug therapy: Secondary | ICD-10-CM

## 2020-11-10 DIAGNOSIS — M533 Sacrococcygeal disorders, not elsewhere classified: Secondary | ICD-10-CM

## 2020-11-10 DIAGNOSIS — M5416 Radiculopathy, lumbar region: Secondary | ICD-10-CM

## 2020-11-10 MED ORDER — HYDROCODONE-ACETAMINOPHEN 10-325 MG PO TABS: 1.0000 | ORAL_TABLET | Freq: Three times a day (TID) | ORAL | 0 refills | Status: DC | PRN

## 2020-11-10 NOTE — Telephone Encounter (Signed)
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 Case discussed with Dr. Johnnette Litter. With the condyloma being in the perianal area he would recommend referral to colorectal surgeon. Called and discussed this with Vanessa Greer. She is still under the care of Dr. Michaell Cowing in [place] and will continue to see him for this issue. Appointment cancelled and note routed to Dr. Feliberto Gottron for notification.

## 2020-11-10 NOTE — Progress Notes (Signed)
Patient: Vanessa Greer  Service Category: E/M  Provider: Edward Jolly, MD  DOB: 1961/10/07  DOS: 11/10/2020  Location: Office  MRN: [id number]  Setting: Ambulatory outpatient  Referring Provider: Lynnea Ferrier, MD  Type: Established Patient  Specialty: Interventional Pain Management  PCP: Lynnea Ferrier, MD  Location: Home  Delivery: TeleHealth     Virtual Encounter - Pain Management PROVIDER NOTE: Information contained herein reflects review and annotations entered in association with encounter. Interpretation of such information and data should be left to medically-trained personnel. Information provided to patient can be located elsewhere in the medical record under "Patient Instructions". Document created using STT-dictation technology, any transcriptional errors that may result from process are unintentional.    Contact & Pharmacy Preferred: [phone] Home: [phone] (home) Mobile: [phone] (mobile) E-mail: [email or web address]  [facility] - [place], [place] - [address] [place] [address] Phone: [phone] Fax: [phone]  [id number] [place], [place] - [address] AT [facility] OF [address] [place] [address] [place] [address] Phone: [phone] Fax: [phone]   Pre-screening  Ms. Sherral Hammers offered "in-person" vs "virtual" encounter. She indicated preferring virtual for this encounter.   Reason COVID-19*  Social distancing based on CDC and AMA recommendations.   I contacted [name] on 11/10/2020 via video conference.      I clearly identified myself as Edward Jolly, MD. I verified that I was speaking with the correct person using two identifiers (Name: LYNDZIE ZENTZ, and date of birth: 25-Jan-1961).  Consent I sought verbal advanced consent from [name] for virtual visit interactions. I informed Ms. Falletta of possible security and privacy concerns, risks, and  limitations associated with providing "not-in-person" medical evaluation and management services. I also informed Ms. Arrighi of the availability of "in-person" appointments. Finally, I informed her that there would be a charge for the virtual visit and that she could be  personally, fully or partially, financially responsible for it. Ms. Babic expressed understanding and agreed to proceed.   Historic Elements   Ms. RAYVON BRANDVOLD is a 59 y.o. year old, female patient evaluated today after our last contact on 08/23/2020. Ms. Soman  has a past medical history of Anxiety, Back pain, DDD (degenerative disc disease), lumbar, Depression, GERD (gastroesophageal reflux disease), High cholesterol, Lumbar radiculitis, and Pneumothorax (1982). She also  has a past surgical history that includes Abdominal hysterectomy; Foot surgery; Ectopic pregnancy surgery; Colonoscopy; Laser ablation condolamata (N/A, 12/13/2017); and Esophagogastroduodenoscopy (egd) with propofol (N/A, 03/17/2019). Ms. Karren has a current medication list which includes the following prescription(s): vitamin d3, cyclobenzaprine, furosemide, [START ON 12/06/2020] hydrocodone-acetaminophen, [START ON 01/05/2021] hydrocodone-acetaminophen, [START ON 02/04/2021] hydrocodone-acetaminophen, lyrica, multivitamin with minerals, omega-3 fatty acids, pantoprazole, sertraline, simvastatin, trazodone, and turmeric. She  reports that she has been smoking cigarettes. She has a 20.50 pack-year smoking history. She has never used smokeless tobacco. She reports current alcohol use. She reports that she does not use drugs. Ms. Mand is allergic to bupropion.   HPI  Today, she is being contacted for medication management.  No change in medical history since last visit.  Patient's pain is at baseline.  Patient continues multimodal pain regimen as prescribed.  States that it provides pain relief and improvement in functional status.  Pharmacotherapy Assessment   Analgesic: 11/07/2020  08/23/2020   1  Hydrocodone-Acetamin 10-325 Mg 90.00  30  Bi Lat  [id number]  Wal (5798)  0/0  30.00 MME  Comm Ins  [place]     Monitoring: [place] PMP: PDMP reviewed during this encounter.       Pharmacotherapy: No side-effects or adverse reactions reported. Compliance: No problems identified. Effectiveness: Clinically acceptable. Plan: Refer to "POC".  UDS:  Summary  Date Value Ref Range Status  08/23/2020 Note  Final    Comment:    ==================================================================== [employer] Select 13 (MW) ==================================================================== Test                             Result       Flag       Units  Drug Present and Declared for Prescription Verification   Hydrocodone                    2403         EXPECTED   ng/mg creat   Hydromorphone                  449          EXPECTED   ng/mg creat   Dihydrocodeine                 180          EXPECTED   ng/mg creat   Norhydrocodone                 >2283        EXPECTED   ng/mg creat    Sources of hydrocodone include scheduled prescription medications.    Hydromorphone, dihydrocodeine and norhydrocodone are expected    metabolites of hydrocodone. Hydromorphone and dihydrocodeine are    also available as scheduled prescription medications.  ==================================================================== Test                      Result    Flag   Units      Ref Range   Creatinine              219              mg/dL      >=59 ==================================================================== Declared Medications:  The flagging and interpretation on this report are based on the  following declared medications.  Unexpected results may arise from  inaccuracies in the declared medications.   **Note: The testing scope of this panel includes these medications:   Hydrocodone (Norco)   **Note: The testing scope of this panel does not include the  following reported  medications:   Acetaminophen (Norco)  Cyclobenzaprine (Flexeril)  Furosemide (Lasix)  Iron  Multivitamin  Pantoprazole (Protonix)  Pregabalin (Lyrica)  Sertraline (Zoloft)  Simvastatin (Zocor)  Trazodone (Desyrel)  Turmeric  Vitamin D3 ==================================================================== For clinical consultation, please call [phone]. ====================================================================       Imaging  MM 3D SCREEN BREAST BILATERAL CLINICAL DATA:  Screening.  EXAM: DIGITAL SCREENING BILATERAL MAMMOGRAM WITH TOMO AND CAD  COMPARISON:  Previous exam(s).  ACR Breast Density Category b: There are scattered areas of fibroglandular density.  FINDINGS: There are no findings suspicious for malignancy. Images were processed with CAD.  IMPRESSION: No mammographic evidence of malignancy. A result letter of this screening mammogram will be mailed directly to the patient.  RECOMMENDATION: Screening mammogram in one year. (Code:SM-B-01Y)  BI-RADS CATEGORY  1: Negative.  Electronically Signed   By: Gerome Sam III M.D   On: 10/23/2020 14:00  Assessment  The primary encounter diagnosis was Coccyodynia. Diagnoses of Chronic pain syndrome, Lumbar spondylosis, Lumbar degenerative disc disease, Controlled substance agreement signed, Sacroiliac joint pain, and Lumbar radiculopathy were also pertinent to this visit.  Plan of Care   Ms. HERO MCCATHERN has a current medication list which includes the following long-term medication(s): furosemide, [START ON 12/06/2020] hydrocodone-acetaminophen, [START ON 01/05/2021] hydrocodone-acetaminophen, [START ON 02/04/2021] hydrocodone-acetaminophen, lyrica, sertraline, simvastatin, and trazodone.  Pharmacotherapy (Medications Ordered): Meds ordered this encounter  Medications  . HYDROcodone-acetaminophen (NORCO) 10-325 MG tablet    Sig: Take 1 tablet by mouth every 8 (eight) hours as needed for  severe pain. Must last 30 days. For chronic pain.    Dispense:  90 tablet    Refill:  0    [place] STOP ACT - Not applicable. Fill one day early if pharmacy is closed on scheduled refill date.  Marland Kitchen HYDROcodone-acetaminophen (NORCO) 10-325 MG tablet    Sig: Take 1 tablet by mouth every 8 (eight) hours as needed for severe pain. Must last 30 days. For chronic pain.    Dispense:  90 tablet    Refill:  0    [place] STOP ACT - Not applicable. Fill one day early if pharmacy is closed on scheduled refill date.  Marland Kitchen HYDROcodone-acetaminophen (NORCO) 10-325 MG tablet    Sig: Take 1 tablet by mouth every 8 (eight) hours as needed for severe pain. Must last 30 days. For chronic pain.    Dispense:  90 tablet    Refill:  0    [place] STOP ACT - Not applicable. Fill one day early if pharmacy is closed on scheduled refill date.   Continue Lyrica as prescribed  Follow-up plan:   Return in about 16 weeks (around 03/02/2021) for Medication Management, in person.     status post left L3, L4, L5, S1 RFA on 07/06/2019 and right L3, L4, L5, S1 RFA on 07/22/2019-    Recent Visits Date Type Provider Dept  08/23/20 Office Visit Edward Jolly, MD [facility]  Showing recent visits within past 90 days and meeting all other requirements Today's Visits Date Type Provider Dept  11/10/20 Telemedicine Edward Jolly, MD [facility]  Showing today's visits and meeting all other requirements Future Appointments No visits were found meeting these conditions. Showing future appointments within next 90 days and meeting all other requirements  I discussed the assessment and treatment plan with the patient. The patient was provided an opportunity to ask questions and all were answered. The patient agreed with the plan and demonstrated an understanding of the instructions.  Patient advised to call back or seek an in-person evaluation if the symptoms or condition worsens.  Duration of encounter: 30 minutes.  Note by: Edward Jolly, MD Date: 11/10/2020; Time: 1:27 PM

## 2020-11-15 ENCOUNTER — Encounter: Payer: BC Managed Care – PPO | Admitting: Student in an Organized Health Care Education/Training Program

## 2020-11-22 ENCOUNTER — Encounter: Payer: BC Managed Care – PPO | Admitting: Student in an Organized Health Care Education/Training Program

## 2020-11-23 ENCOUNTER — Ambulatory Visit: Payer: BC Managed Care – PPO

## 2021-03-02 ENCOUNTER — Other Ambulatory Visit: Payer: Self-pay

## 2021-03-02 ENCOUNTER — Encounter: Payer: Self-pay | Admitting: Student in an Organized Health Care Education/Training Program

## 2021-03-02 ENCOUNTER — Ambulatory Visit
Payer: BC Managed Care – PPO | Attending: Student in an Organized Health Care Education/Training Program | Admitting: Student in an Organized Health Care Education/Training Program

## 2021-03-02 VITALS — BP 107/75 | HR 83 | Temp 97.1°F | Resp 16 | Ht 67.0 in | Wt 168.0 lb

## 2021-03-02 DIAGNOSIS — M47816 Spondylosis without myelopathy or radiculopathy, lumbar region: Secondary | ICD-10-CM | POA: Diagnosis not present

## 2021-03-02 DIAGNOSIS — M533 Sacrococcygeal disorders, not elsewhere classified: Secondary | ICD-10-CM

## 2021-03-02 DIAGNOSIS — M5136 Other intervertebral disc degeneration, lumbar region: Secondary | ICD-10-CM | POA: Diagnosis not present

## 2021-03-02 DIAGNOSIS — G894 Chronic pain syndrome: Secondary | ICD-10-CM | POA: Diagnosis not present

## 2021-03-02 IMAGING — CR DG SI JOINTS 3+V
3 series · 3 of 3 positions shown · non-contrast
Comparison: Sacral radiographs 03/03/2020.

CLINICAL DATA: Bilateral sacroiliac pain.

EXAM:
BILATERAL SACROILIAC JOINTS - 3+ VIEW

[si joints ap]
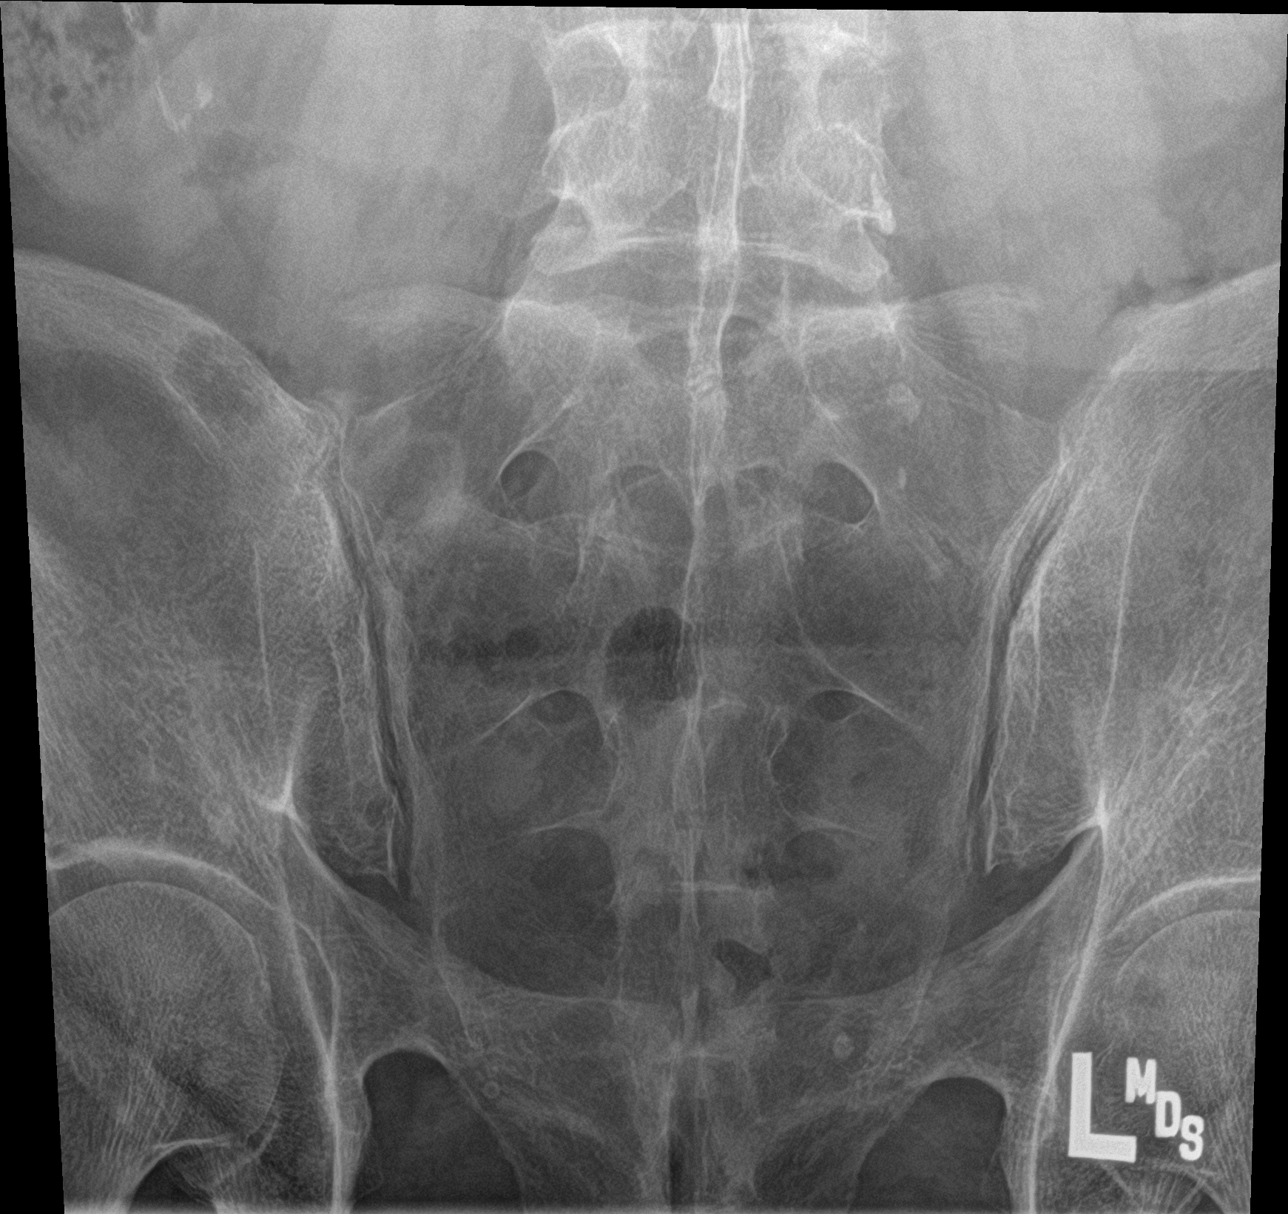

[si joints obl (1 of 2)]
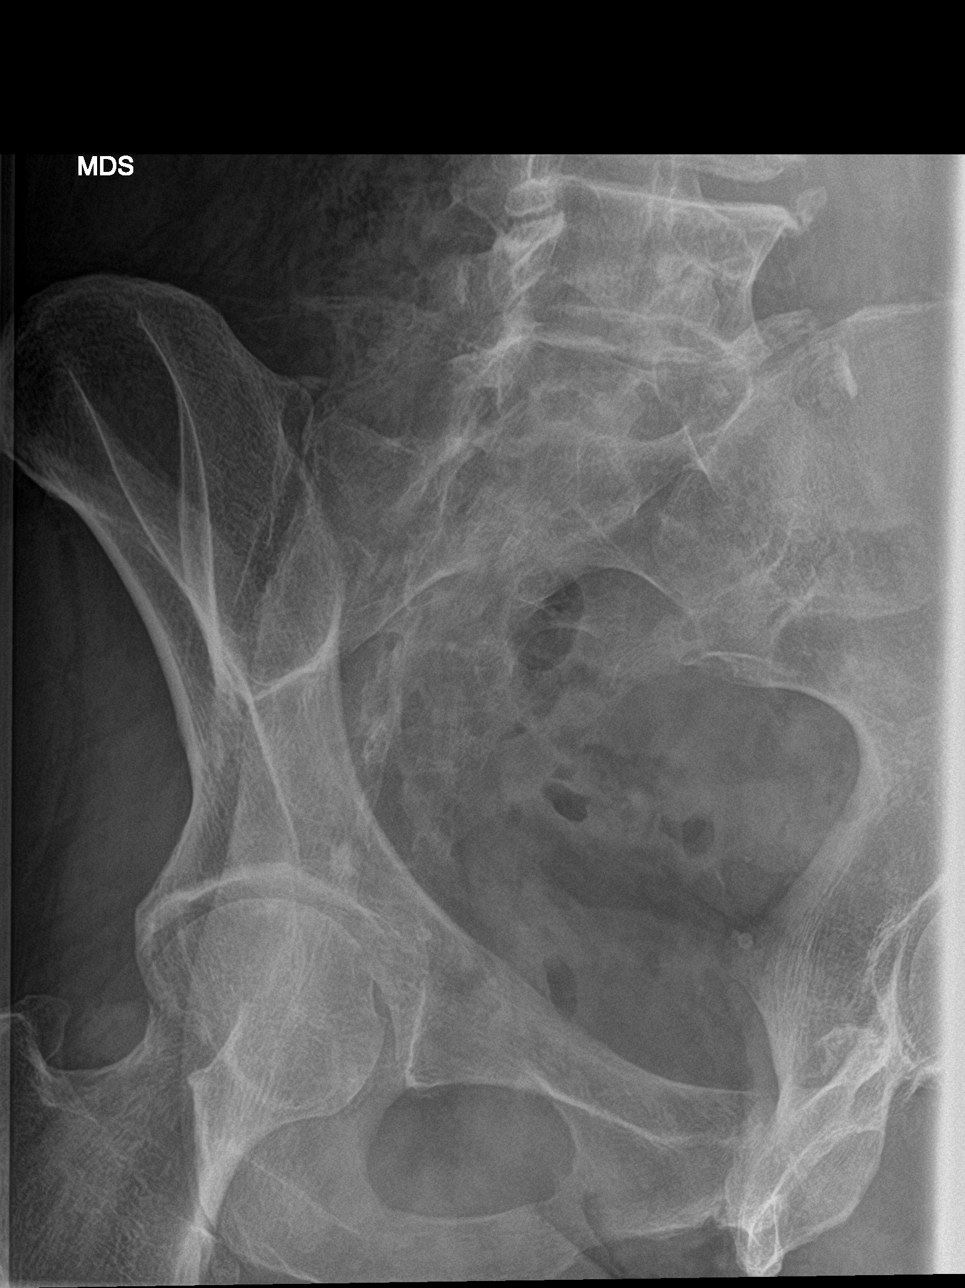

[si joints obl (2 of 2)]
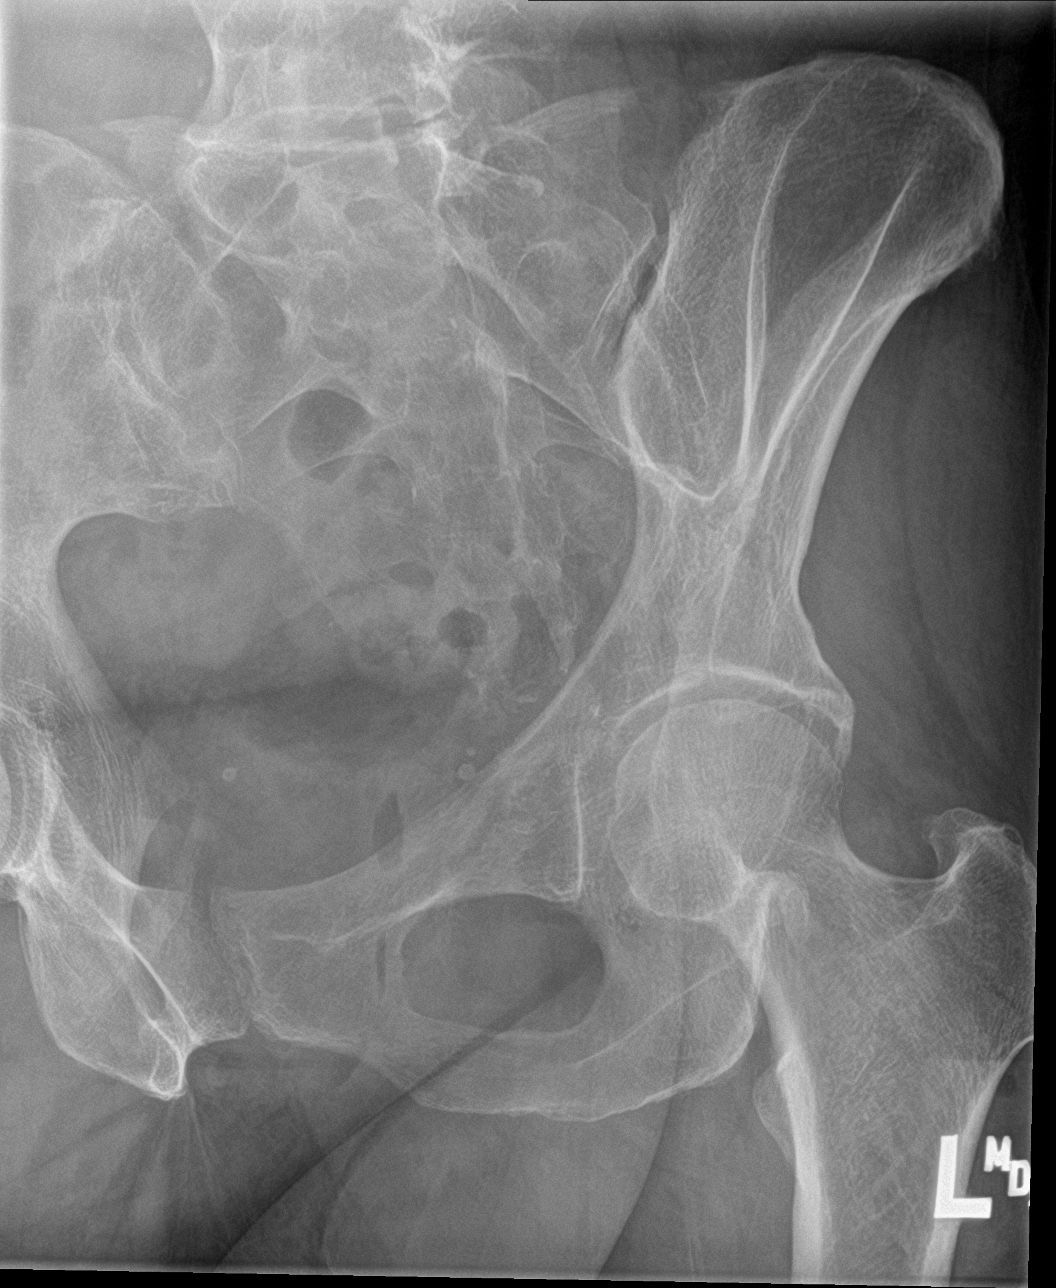

[3 of 3 positions shown; findings below may reference images not displayed]

FINDINGS: Mild left SI degenerative changes are stable. Sacrum is intact. The
soft tissues are otherwise unremarkable.
IMPRESSION: 1. Stable mild left SI degenerative changes.
2. No acute abnormality.

## 2021-03-02 IMAGING — MR MR LUMBAR SPINE W/O CM
5 series · 31 of 48 positions shown · non-contrast
Comparison: 09/27/2018.

CLINICAL DATA: Low back pain over the last several years. Some left
leg pain when walking.

EXAM:
MRI LUMBAR SPINE WITHOUT CONTRAST
TECHNIQUE: Multiplanar, multisequence MR imaging of the lumbar spine was
performed. No intravenous contrast was administered.

[Series 5: T2 · sagittal · 4.0mm · 0.81mm/px · 6 of 17 slices shown (1 of 2)]
[im 1/17]
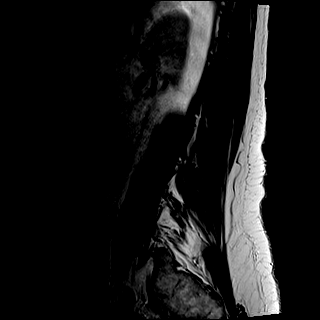
[im 4/17]
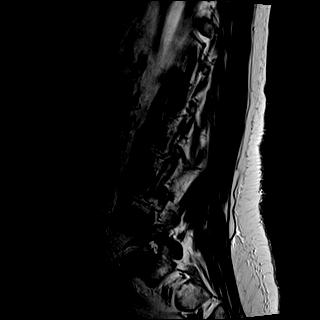
[im 7/17]
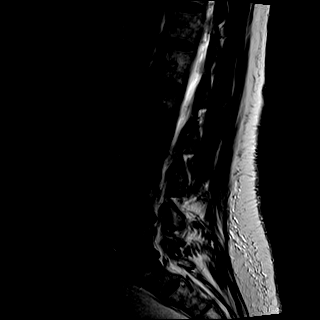
[im 10/17]
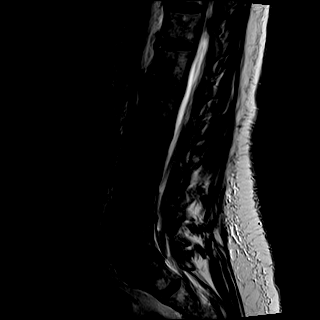
[im 13/17]
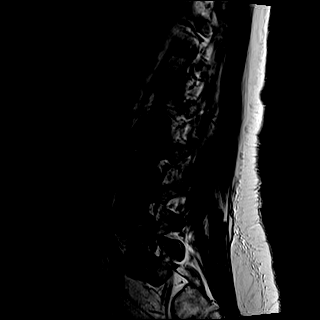
[im 17/17]
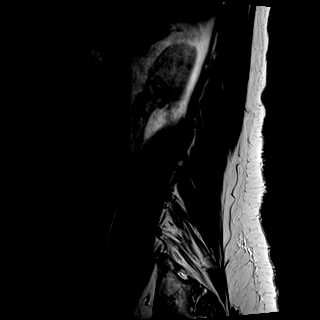

[Series 6: T1 · sagittal · 4.0mm · 0.81mm/px · 6 of 17 slices shown (1 of 2)]
[im 1/17]
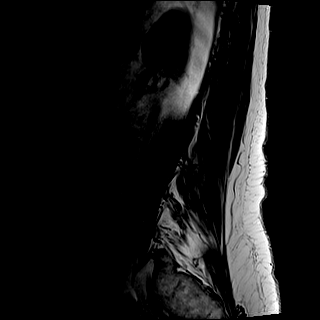
[im 4/17]
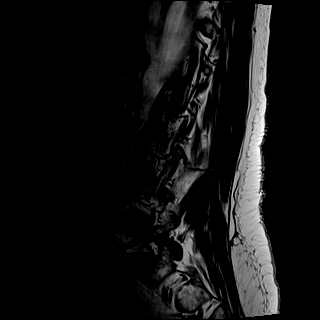
[im 7/17]
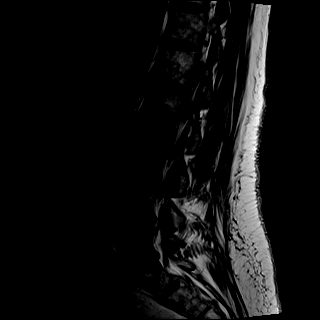
[im 10/17]
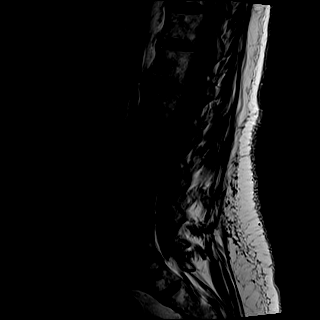
[im 13/17]
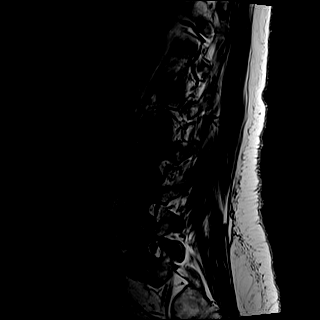
[im 17/17]
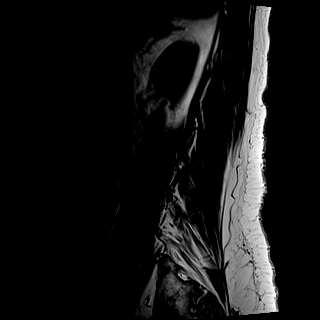

[Series 7: STIR · sagittal · 4.0mm · 0.41mm/px · 1 of 17 slices shown]
[im 1/17]
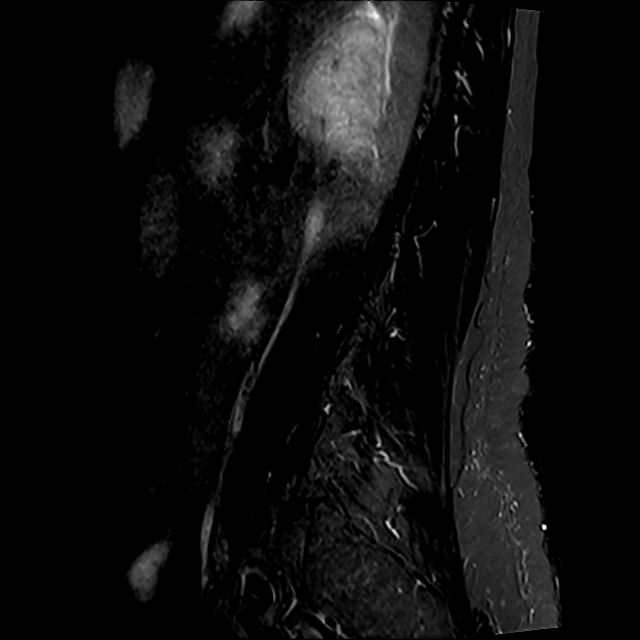

[Series 8: T1 · axial · 4.0mm · 0.39mm/px · z∈[-65,+179]mm · 9 of 40 slices shown (2 of 2)]
[im 1/40]
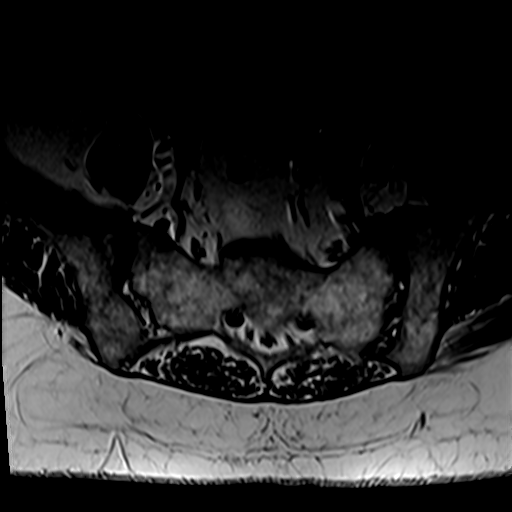
[im 6/40]
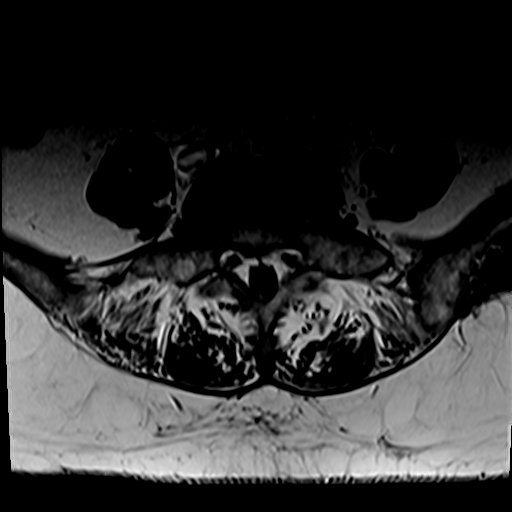
[im 12/40]
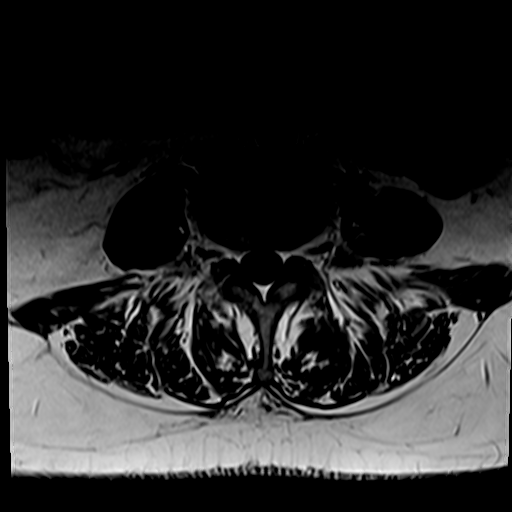
[im 17/40]
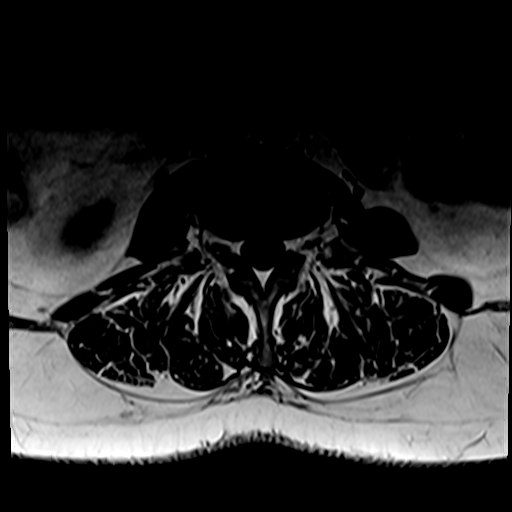
[im 20/40]
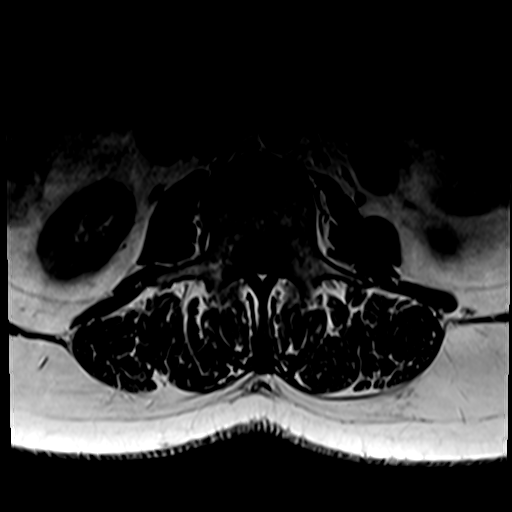
[im 23/40]
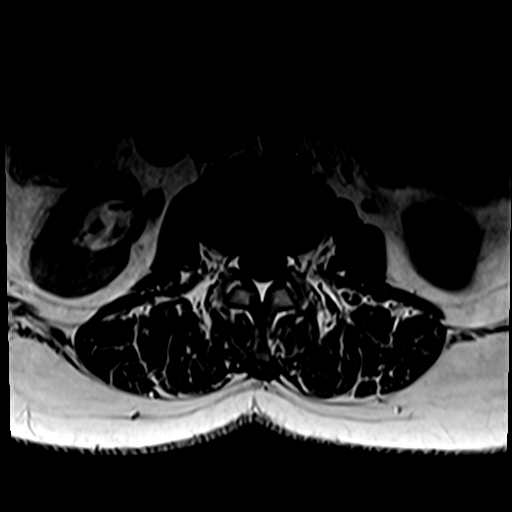
[im 28/40]
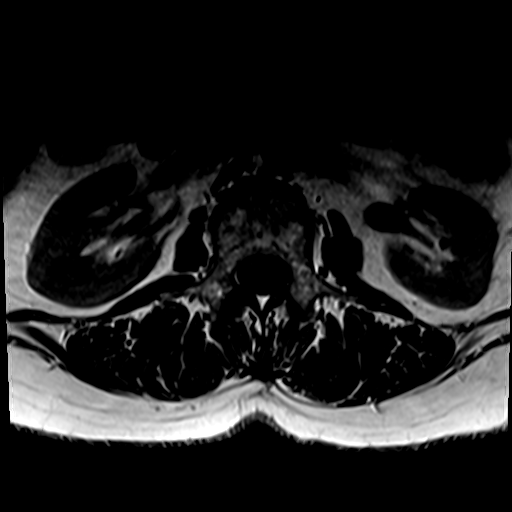
[im 34/40]
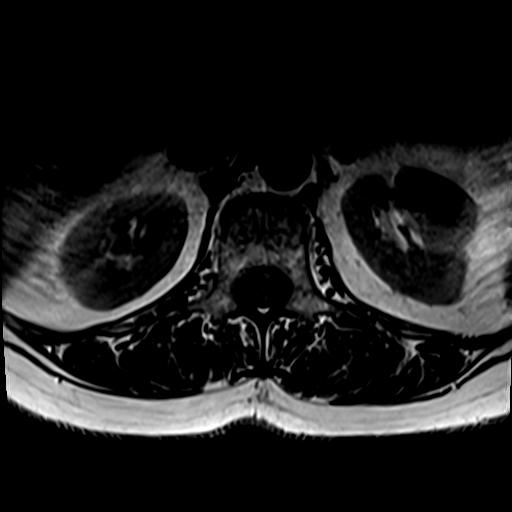
[im 40/40]
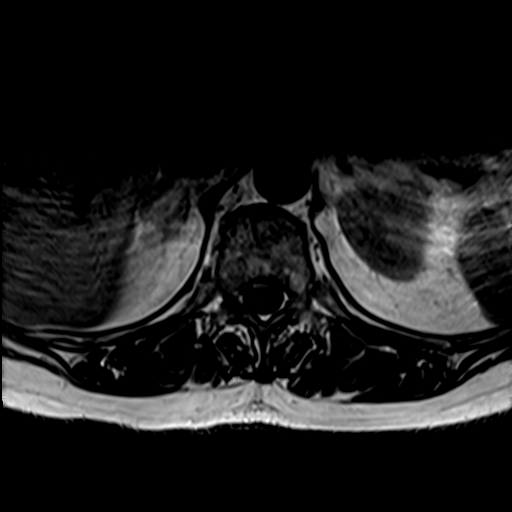

[Series 9: T2 · axial · 4.0mm · 0.78mm/px · z∈[-65,+179]mm · 9 of 40 slices shown (2 of 2)]
[im 1/40]
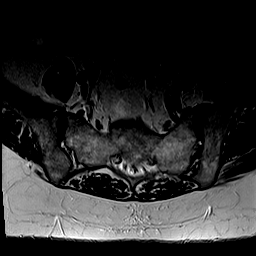
[im 6/40]
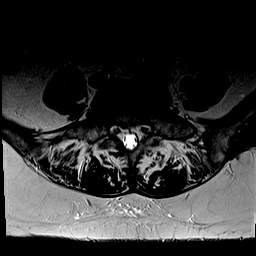
[im 12/40]
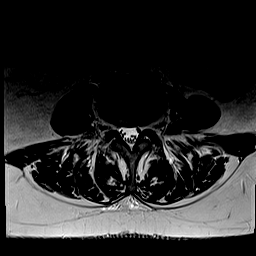
[im 17/40]
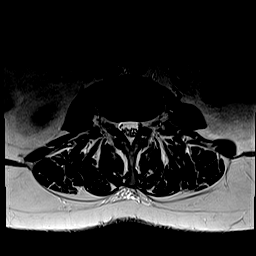
[im 20/40]
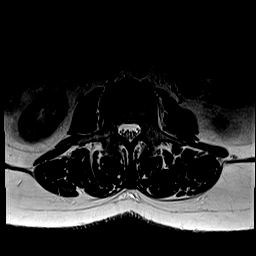
[im 23/40]
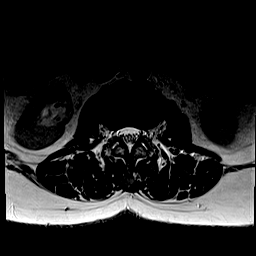
[im 28/40]
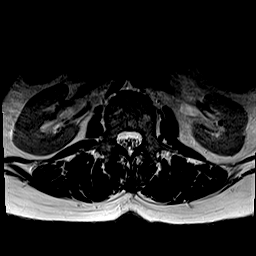
[im 34/40]
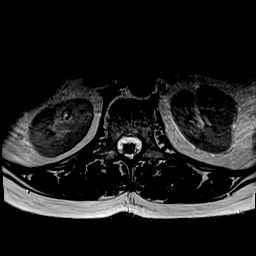
[im 40/40]
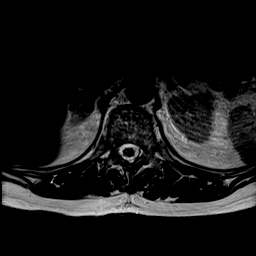

[31 of 48 positions shown; findings below may reference images not displayed]

FINDINGS: Segmentation: 5 lumbar type vertebral bodies as numbered previously.

Alignment:  Normal

Vertebrae:  No fracture or primary bone lesion.

Conus medullaris and cauda equina: Conus extends to the L1-2 level.
Conus and cauda equina appear normal.

Paraspinal and other soft tissues: Right adrenal mass partially
demonstrated, measuring up to 2.5 cm in diameter. This is known from
previous CT exams and is consistent with adenoma.

Disc levels:

No significant finding at L3-4 or above. The discs are normal. The
canal and foramina are widely patent.

L4-5: Desiccation of the disc. Endplate osteophytes and mild bulging
of the disc. Mild narrowing of both lateral recesses but no apparent
neural compression. No significant facet degeneration.

L5-S1: Desiccation and mild bulging of the disc. No canal or
foraminal stenosis. No facet arthropathy.
IMPRESSION: 1. L4-5 and L5-S1 disc degeneration with mild desiccation and
bulging of the discs. No compressive narrowing of the canal or
foramina. No facet arthropathy. Findings could contribute to low
back pain. No apparent change since [DATE]. Right adrenal mass, known from previous CT exams and consistent
with adenoma.

## 2021-03-02 MED ORDER — HYDROCODONE-ACETAMINOPHEN 10-325 MG PO TABS: 1.0000 | ORAL_TABLET | Freq: Three times a day (TID) | ORAL | 0 refills | Status: DC | PRN

## 2021-03-02 NOTE — Progress Notes (Signed)
Nursing Pain Medication Assessment:  Safety precautions to be maintained throughout the outpatient stay will include: orient to surroundings, keep bed in low position, maintain call bell within reach at all times, provide assistance with transfer out of bed and ambulation.  Medication Inspection Compliance: Pill count conducted under aseptic conditions, in front of the patient. Neither the pills nor the bottle was removed from the patient's sight at any time. Once count was completed pills were immediately returned to the patient in their original bottle.  Medication: Hydrocodone/APAP Pill/Patch Count: 39.5 of 90 pills remain Pill/Patch Appearance: Markings consistent with prescribed medication Bottle Appearance: Standard pharmacy container. Clearly labeled. Filled Date: 2 / 14 / 2022 Last Medication intake:  Today

## 2021-03-02 NOTE — Progress Notes (Signed)
PROVIDER NOTE: Information contained herein reflects review and annotations entered in association with encounter. Interpretation of such information and data should be left to medically-trained personnel. Information provided to patient can be located elsewhere in the medical record under "Patient Instructions". Document created using STT-dictation technology, any transcriptional errors that may result from process are unintentional.    Patient: Vanessa Greer  Service Category: E/M  Provider: Gillis Santa, MD  DOB: [birth year]  DOS: 03/02/2021  Specialty: Interventional Pain Management  MRN: [id number]  Setting: Ambulatory outpatient  PCP: Adin Hector, MD  Type: Established Patient    Referring Provider: Adin Hector, MD  Location: Office  Delivery: Face-to-face     HPI  [name], a 60 y.o. year old female, is here today because of her Coccyodynia [M53.3]. Ms. [name] primary complain today is Back Pain (lower)  Pertinent problems: Ms. [name] has Chronic bilateral low back pain with bilateral sciatica; Lumbar facet arthropathy; Lumbar spondylosis; Lumbar degenerative disc disease; Chronic pain syndrome; and Coccyodynia on their pertinent problem list. Pain Assessment: Severity of Chronic pain is reported as a 0-No pain/10. Location: Back Lower/denies. Onset: More than a month ago. Quality: Aching (when in pain, pt describes aching). Timing: Constant. Modifying factor(s): meds. Vitals:  height is 5' 7" (1.702 m) and weight is 168 lb (76.2 kg). Her temporal temperature is 97.1 F (36.2 C) (abnormal). Her blood pressure is 107/75 and her pulse is 83. Her respiration is 16 and oxygen saturation is 100%.   Reason for encounter: medication management.    No change in medical history since last visit.  Patient's pain is at baseline.  Patient continues multimodal pain regimen as prescribed.  States that it provides pain relief and improvement in functional status.   Pharmacotherapy  Assessment   02/10/2021  11/10/2020   1  Hydrocodone-Acetamin 10-325 Mg  90.00  30  Bi Lat  [id number]  [facility] ([id number])  0/0  30.00 MME  Comm Ins  [place]      Analgesic: Norco 10 mg TID prn   Monitoring:  PMP: PDMP not reviewed this encounter.       Pharmacotherapy: No side-effects or adverse reactions reported. Compliance: No problems identified. Effectiveness: Clinically acceptable.  [name], RN  03/02/2021  8:15 AM  Sign when Signing Visit Nursing Pain Medication Assessment:  Safety precautions to be maintained throughout the outpatient stay will include: orient to surroundings, keep bed in low position, maintain call bell within reach at all times, provide assistance with transfer out of bed and ambulation.  Medication Inspection Compliance: Pill count conducted under aseptic conditions, in front of the patient. Neither the pills nor the bottle was removed from the patient's sight at any time. Once count was completed pills were immediately returned to the patient in their original bottle.  Medication: Hydrocodone/APAP Pill/Patch Count: 39.5 of 90 pills remain Pill/Patch Appearance: Markings consistent with prescribed medication Bottle Appearance: Standard pharmacy container. Clearly labeled. Filled Date: 2 / 36 / 2022 Last Medication intake:  Today    UDS:  Summary  Date Value Ref Range Status  08/23/2020 Note  Final    Comment:    ==================================================================== [facility] (MW) ==================================================================== Test                             Result       Flag       Units  Drug Present and Declared for  Prescription Verification   Hydrocodone                    2403         EXPECTED   ng/mg creat   Hydromorphone                  449          EXPECTED   ng/mg creat   Dihydrocodeine                 180          EXPECTED   ng/mg creat   Norhydrocodone                 >2283        EXPECTED   ng/mg  creat    Sources of hydrocodone include scheduled prescription medications.    Hydromorphone, dihydrocodeine and norhydrocodone are expected    metabolites of hydrocodone. Hydromorphone and dihydrocodeine are    also available as scheduled prescription medications.  ==================================================================== Test                      Result    Flag   Units      Ref Range   Creatinine              219              mg/dL      >=20 ==================================================================== Declared Medications:  The flagging and interpretation on this report are based on the  following declared medications.  Unexpected results may arise from  inaccuracies in the declared medications.   **Note: The testing scope of this panel includes these medications:   Hydrocodone (Norco)   **Note: The testing scope of this panel does not include the  following reported medications:   Acetaminophen (Norco)  Cyclobenzaprine (Flexeril)  Furosemide (Lasix)  Iron  Multivitamin  Pantoprazole (Protonix)  Pregabalin (Lyrica)  Sertraline (Zoloft)  Simvastatin (Zocor)  Trazodone (Desyrel)  Turmeric  Vitamin D3 ==================================================================== For clinical consultation, please call [phone]. ====================================================================      ROS  Constitutional: Denies any fever or chills Gastrointestinal: No reported hemesis, hematochezia, vomiting, or acute GI distress Musculoskeletal: +LBP Neurological: No reported episodes of acute onset apraxia, aphasia, dysarthria, agnosia, amnesia, paralysis, loss of coordination, or loss of consciousness  Medication Review  HYDROcodone-acetaminophen, Omega-3 Fatty Acids, Turmeric, Vitamin D3, cyclobenzaprine, furosemide, multivitamin with minerals, pantoprazole, pregabalin, sertraline, simvastatin, and traZODone  History Review  Allergy: [name] is  allergic to bupropion. Drug: [name]  reports no history of drug use. Alcohol:  reports current alcohol use. Tobacco:  reports that she has been smoking cigarettes. She has a 20.50 pack-year smoking history. She has never used smokeless tobacco. Social: Ms. Fontes  reports that she has been smoking cigarettes. She has a 20.50 pack-year smoking history. She has never used smokeless tobacco. She reports current alcohol use. She reports that she does not use drugs. Medical:  has a past medical history of Anxiety, Back pain, DDD (degenerative disc disease), lumbar, Depression, GERD (gastroesophageal reflux disease), High cholesterol, Lumbar radiculitis, and Pneumothorax (1982). Surgical: Ms. Abbett  has a past surgical history that includes Abdominal hysterectomy; Foot surgery; Ectopic pregnancy surgery; Colonoscopy; Laser ablation condolamata (N/A, 12/13/2017); and Esophagogastroduodenoscopy (egd) with propofol (N/A, 03/17/2019). Family: family history includes Breast cancer (age of onset: 30) in her mother.  Laboratory Chemistry Profile   Renal No results found for: BUN, CREATININE,  LABCREA, BCR, GFR, GFRAA, GFRNONAA, LABVMA, EPIRU, EPINEPH24HUR, NOREPRU, NOREPI24HUR, DOPARU, [id number]   Hepatic No results found for: AST, ALT, ALBUMIN, ALKPHOS, HCVAB, AMYLASE, LIPASE, AMMONIA   Electrolytes No results found for: NA, K, CL, CALCIUM, MG, PHOS   Bone No results found for: VD25OH, [id number], [id number], ML5449EE1, 25OHVITD1, 25OHVITD2, 25OHVITD3, TESTOFREE, TESTOSTERONE   Inflammation (CRP: Acute Phase) (ESR: Chronic Phase) No results found for: CRP, ESRSEDRATE, LATICACIDVEN     Note: Above Lab results reviewed.  Recent Imaging Review  MM 3D SCREEN BREAST BILATERAL CLINICAL DATA:  Screening.  EXAM: DIGITAL SCREENING BILATERAL MAMMOGRAM WITH TOMO AND CAD  COMPARISON:  Previous exam(s).  ACR Breast Density Category b: There are scattered areas of fibroglandular  density.  FINDINGS: There are no findings suspicious for malignancy. Images were processed with CAD.  IMPRESSION: No mammographic evidence of malignancy. A result letter of this screening mammogram will be mailed directly to the patient.  RECOMMENDATION: Screening mammogram in one year. (Code:SM-B-01Y)  BI-RADS CATEGORY  1: Negative.  Electronically Signed   By: Dorise Bullion III M.D   On: 10/23/2020 14:00 Note: Reviewed        Physical Exam  General appearance: Well nourished, well developed, and well hydrated. In no apparent acute distress Mental status: Alert, oriented x 3 (person, place, & time)       Respiratory: No evidence of acute respiratory distress Eyes: PERLA Vitals: BP 107/75   Pulse 83   Temp (!) 97.1 F (36.2 C) (Temporal)   Resp 16   Ht 5' 7" (1.702 m)   Wt 168 lb (76.2 kg)   SpO2 100%   BMI 26.31 kg/m  BMI: Estimated body mass index is 26.31 kg/m as calculated from the following:   Height as of this encounter: 5' 7" (1.702 m).   Weight as of this encounter: 168 lb (76.2 kg). Ideal: Ideal body weight: 61.6 kg (135 lb 12.9 oz) Adjusted ideal body weight: 67.4 kg (148 lb 10.9 oz)  +LBP with facet loading  Assessment   Status Diagnosis  Controlled Controlled Controlled 1. Coccyodynia   2. Chronic pain syndrome   3. Lumbar spondylosis   4. Lumbar degenerative disc disease   5. Sacroiliac joint pain      Updated Problems: No problems updated.  Plan of Care  Ms. NEESA KNAPIK has a current medication list which includes the following long-term medication(s): furosemide, lyrica, sertraline, simvastatin, trazodone, [START ON 03/12/2021] hydrocodone-acetaminophen, [START ON 04/11/2021] hydrocodone-acetaminophen, and [START ON 05/11/2021] hydrocodone-acetaminophen.  Pharmacotherapy (Medications Ordered): Meds ordered this encounter  Medications  . HYDROcodone-acetaminophen (NORCO) 10-325 MG tablet    Sig: Take 1 tablet by mouth every 8 (eight)  hours as needed for severe pain. Must last 30 days. For chronic pain.    Dispense:  90 tablet    Refill:  0    [place] STOP ACT - Not applicable. Fill one day early if pharmacy is closed on scheduled refill date.  [place] HYDROcodone-acetaminophen (NORCO) 10-325 MG tablet    Sig: Take 1 tablet by mouth every 8 (eight) hours as needed for severe pain. Must last 30 days. For chronic pain.    Dispense:  90 tablet    Refill:  0    [place] STOP ACT - Not applicable. Fill one day early if pharmacy is closed on scheduled refill date.  Marland Kitchen HYDROcodone-acetaminophen (NORCO) 10-325 MG tablet    Sig: Take 1 tablet by mouth every 8 (eight) hours as needed for severe pain. Must last 30 days.  For chronic pain.    Dispense:  90 tablet    Refill:  0    [place] STOP ACT - Not applicable. Fill one day early if pharmacy is closed on scheduled refill date.   Continue Lyrica as prescribed by PCP  Follow-up plan:   Return in about 3 months (around 06/02/2021) for Medication Management, in person.     status post left L3, L4, L5, S1 RFA on 07/06/2019 and right L3, L4, L5, S1 RFA on 07/22/2019-     Recent Visits No visits were found meeting these conditions. Showing recent visits within past 90 days and meeting all other requirements Today's Visits Date Type Provider Dept  03/02/21 Office Visit Gillis Santa, MD [facility]  Showing today's visits and meeting all other requirements Future Appointments Date Type Provider Dept  05/25/21 Appointment Gillis Santa, MD [facility]  Showing future appointments within next 90 days and meeting all other requirements  I discussed the assessment and treatment plan with the patient. The patient was provided an opportunity to ask questions and all were answered. The patient agreed with the plan and demonstrated an understanding of the instructions.  Patient advised to call back or seek an in-person evaluation if the symptoms or condition worsens.  Duration of encounter: 30  minutes.  Note by: Gillis Santa, MD Date: 03/02/2021; Time: 8:42 AM

## 2021-03-12 ENCOUNTER — Other Ambulatory Visit: Payer: Self-pay | Admitting: Student in an Organized Health Care Education/Training Program

## 2021-03-12 DIAGNOSIS — G894 Chronic pain syndrome: Secondary | ICD-10-CM

## 2021-05-15 ENCOUNTER — Telehealth: Payer: Self-pay

## 2021-05-15 ENCOUNTER — Telehealth: Payer: Self-pay | Admitting: Student in an Organized Health Care Education/Training Program

## 2021-05-15 DIAGNOSIS — G894 Chronic pain syndrome: Secondary | ICD-10-CM

## 2021-05-15 MED ORDER — HYDROCODONE-ACETAMINOPHEN 10-325 MG PO TABS: 1.0000 | ORAL_TABLET | Freq: Three times a day (TID) | ORAL | 0 refills | Status: DC | PRN

## 2021-05-15 NOTE — Telephone Encounter (Signed)
I spoke with pharmacist, the 04/2021 prescription for Hydrocodone was cancelled by a flaw in the computer system. Will you please resend?

## 2021-05-15 NOTE — Telephone Encounter (Signed)
Requested Prescriptions   Signed Prescriptions Disp Refills  . HYDROcodone-acetaminophen (NORCO) 10-325 MG tablet 90 tablet 0    Sig: Take 1 tablet by mouth every 8 (eight) hours as needed for severe pain. Must last 30 days. For chronic pain.    Authorizing Provider: Edward Jolly

## 2021-05-15 NOTE — Telephone Encounter (Signed)
Called patient to let her know that Hydrocodone - apap 10-325 mg has been resent d/t the cancellation by accident by pharmacy

## 2021-05-25 ENCOUNTER — Ambulatory Visit
Payer: BC Managed Care – PPO | Attending: Student in an Organized Health Care Education/Training Program | Admitting: Student in an Organized Health Care Education/Training Program

## 2021-05-25 ENCOUNTER — Other Ambulatory Visit: Payer: Self-pay

## 2021-05-25 ENCOUNTER — Encounter: Payer: Self-pay | Admitting: Student in an Organized Health Care Education/Training Program

## 2021-05-25 VITALS — BP 129/62 | HR 68 | Temp 97.7°F | Ht 68.0 in | Wt 168.0 lb

## 2021-05-25 DIAGNOSIS — G894 Chronic pain syndrome: Secondary | ICD-10-CM

## 2021-05-25 DIAGNOSIS — Z79899 Other long term (current) drug therapy: Secondary | ICD-10-CM | POA: Insufficient documentation

## 2021-05-25 DIAGNOSIS — M47816 Spondylosis without myelopathy or radiculopathy, lumbar region: Secondary | ICD-10-CM | POA: Diagnosis not present

## 2021-05-25 DIAGNOSIS — M533 Sacrococcygeal disorders, not elsewhere classified: Secondary | ICD-10-CM | POA: Diagnosis not present

## 2021-05-25 DIAGNOSIS — M5136 Other intervertebral disc degeneration, lumbar region: Secondary | ICD-10-CM | POA: Insufficient documentation

## 2021-05-25 DIAGNOSIS — M1712 Unilateral primary osteoarthritis, left knee: Secondary | ICD-10-CM

## 2021-05-25 MED ORDER — HYDROCODONE-ACETAMINOPHEN 10-325 MG PO TABS: 1.0000 | ORAL_TABLET | Freq: Three times a day (TID) | ORAL | 0 refills | Status: DC | PRN

## 2021-05-25 NOTE — Progress Notes (Signed)
PROVIDER NOTE: Information contained herein reflects review and annotations entered in association with encounter. Interpretation of such information and data should be left to medically-trained personnel. Information provided to patient can be located elsewhere in the medical record under "Patient Instructions". Document created using STT-dictation technology, any transcriptional errors that may result from process are unintentional.    Patient: Vanessa Greer  Service Category: E/M  Provider: Gillis Santa, MD  DOB: Jul 21, 1961  DOS: 05/25/2021  Specialty: Interventional Pain Management  MRN: [id number]  Setting: Ambulatory outpatient  PCP: Adin Hector, MD  Type: Established Patient    Referring Provider: Adin Hector, MD  Location: Office  Delivery: Face-to-face     HPI  [name], a 60 y.o. year old female, is here today because of her Chronic pain syndrome [G89.4]. [name] primary complain today is Back Pain Last encounter: My last encounter with her was on 05/15/2021. Pertinent problems: [name] has Chronic bilateral low back pain with bilateral sciatica; Lumbar facet arthropathy; Lumbar spondylosis; Lumbar degenerative disc disease; Chronic pain syndrome; and Coccyodynia on their pertinent problem list. Pain Assessment: Severity of   is reported as a 0-No pain/10.  Intermittent left low back, left knee pain.  Pain with weightbearing.  Described as throbbing, aching, painful.  Alleviated by sitting down, rest, ice, elevation. Vitals:  height is _0  (1.727 m) and weight is 168 lb (76.2 kg). Her temperature is 97.7 F (36.5 C). Her blood pressure is 129/62 and her pulse is 68. Her oxygen saturation is 100%.   Reason for encounter: medication management.     Visit with orthopedic surgery, PA Ellard Artis for left knee pain for which she had left knee steroid injection on 05/23/2021.  She was instructed to follow-up as needed.  She presents today for refill of her  hydrocodone for chronic pain syndrome No change in medical history since last visit.  Patient continues multimodal pain regimen as prescribed.  States that it provides pain relief and improvement in functional status.  Discussed neck steps for her left knee which could include viscosupplementation with Monovisc versus genicular nerve block and possible RFA.  Patient will notify us in approximately 3 to 4 weeks to let us know how her left knee is doing and let us know if she wants to proceed with viscosupplementation or GNB.  Pharmacotherapy Assessment   Analgesic: Norco 10 mg TID prn   Monitoring: [place] PMP: PDMP reviewed during this encounter.       Pharmacotherapy: No side-effects or adverse reactions reported. Compliance: No problems identified. Effectiveness: Clinically acceptable.  [name], RN  05/25/2021  8:35 AM  Sign when Signing Visit Nursing Pain Medication Assessment:  Safety precautions to be maintained throughout the outpatient stay will include: orient to surroundings, keep bed in low position, maintain call bell within reach at all times, provide assistance with transfer out of bed and ambulation.  Medication Inspection Compliance: Pill count conducted under aseptic conditions, in front of the patient. Neither the pills nor the bottle was removed from the patient's sight at any time. Once count was completed pills were immediately returned to the patient in their original bottle.  Medication: Hydrocodone/APAP Pill/Patch Count: 64 of 90 pills remain Pill/Patch Appearance: Markings consistent with prescribed medication Bottle Appearance: Standard pharmacy container. Clearly labeled. Filled Date: 5 / 53 / 22 Last Medication intake:  TodaySafety precautions to be maintained throughout the outpatient stay will include: orient to surroundings, keep bed in low  position, maintain call bell within reach at all times, provide assistance with transfer out of bed and ambulation.      UDS:  Summary  Date Value Ref Range Status  08/23/2020 Note  Final    Comment:    ==================================================================== [facility] (MW) ==================================================================== Test                             Result       Flag       Units  Drug Present and Declared for Prescription Verification   Hydrocodone                    2403         EXPECTED   ng/mg creat   Hydromorphone                  449          EXPECTED   ng/mg creat   Dihydrocodeine                 180          EXPECTED   ng/mg creat   Norhydrocodone                 >2283        EXPECTED   ng/mg creat    Sources of hydrocodone include scheduled prescription medications.    Hydromorphone, dihydrocodeine and norhydrocodone are expected    metabolites of hydrocodone. Hydromorphone and dihydrocodeine are    also available as scheduled prescription medications.  ==================================================================== Test                      Result    Flag   Units      Ref Range   Creatinine              219              mg/dL      >=20 ==================================================================== Declared Medications:  The flagging and interpretation on this report are based on the  following declared medications.  Unexpected results may arise from  inaccuracies in the declared medications.   **Note: The testing scope of this panel includes these medications:   Hydrocodone (Norco)   **Note: The testing scope of this panel does not include the  following reported medications:   Acetaminophen (Norco)  Cyclobenzaprine (Flexeril)  Furosemide (Lasix)  Iron  Multivitamin  Pantoprazole (Protonix)  Pregabalin (Lyrica)  Sertraline (Zoloft)  Simvastatin (Zocor)  Trazodone (Desyrel)  Turmeric  Vitamin D3 ==================================================================== For clinical consultation, please call [phone]. ====================================================================      ROS  Constitutional: Denies any fever or chills Gastrointestinal: No reported hemesis, hematochezia, vomiting, or acute GI distress Musculoskeletal: left knee pain  Neurological: No reported episodes of acute onset apraxia, aphasia, dysarthria, agnosia, amnesia, paralysis, loss of coordination, or loss of consciousness  Medication Review  HYDROcodone-acetaminophen, Omega-3 Fatty Acids, Turmeric, Vitamin D3, cyclobenzaprine, furosemide, multivitamin with minerals, pantoprazole, pregabalin, sertraline, simvastatin, and traZODone  History Review  Allergy: Ms. Rodger is allergic to bupropion. Drug: Ms. Wanninger  reports no history of drug use. Alcohol:  reports current alcohol use. Tobacco:  reports that she has been smoking cigarettes. She has a 20.50 pack-year smoking history. She has never used smokeless tobacco. Social: Ms. Sardo  reports that she has been smoking cigarettes. She has a 20.50 pack-year smoking history. She has  never used smokeless tobacco. She reports current alcohol use. She reports that she does not use drugs. Medical:  has a past medical history of Anxiety, Back pain, DDD (degenerative disc disease), lumbar, Depression, GERD (gastroesophageal reflux disease), High cholesterol, Lumbar radiculitis, and Pneumothorax (1982). Surgical: Ms. Lokey  has a past surgical history that includes Abdominal hysterectomy; Foot surgery; Ectopic pregnancy surgery; Colonoscopy; Laser ablation condolamata (N/A, 12/13/2017); and Esophagogastroduodenoscopy (egd) with propofol (N/A, 03/17/2019). Family: family history includes Breast cancer (age of onset: 45) in her mother.  Laboratory Chemistry Profile   Renal No results found for: BUN, CREATININE, LABCREA, BCR, GFR, GFRAA, GFRNONAA, LABVMA, EPIRU, EPINEPH24HUR, NOREPRU, NOREPI24HUR, DOPARU, [id number]   Hepatic No results found for: AST, ALT, ALBUMIN,  ALKPHOS, HCVAB, AMYLASE, LIPASE, AMMONIA   Electrolytes No results found for: NA, K, CL, CALCIUM, MG, PHOS   Bone No results found for: VD25OH, [id number], CV8938BO1, BP1025EN2, 25OHVITD1, 25OHVITD2, 25OHVITD3, TESTOFREE, TESTOSTERONE   Inflammation (CRP: Acute Phase) (ESR: Chronic Phase) No results found for: CRP, ESRSEDRATE, LATICACIDVEN     Note: Above Lab results reviewed.  Recent Imaging Review  MM 3D SCREEN BREAST BILATERAL CLINICAL DATA:  Screening.  EXAM: DIGITAL SCREENING BILATERAL MAMMOGRAM WITH TOMO AND CAD  COMPARISON:  Previous exam(s).  ACR Breast Density Category b: There are scattered areas of fibroglandular density.  FINDINGS: There are no findings suspicious for malignancy. Images were processed with CAD.  IMPRESSION: No mammographic evidence of malignancy. A result letter of this screening mammogram will be mailed directly to the patient.  RECOMMENDATION: Screening mammogram in one year. (Code:SM-B-01Y)  BI-RADS CATEGORY  1: Negative.  Electronically Signed   By: Dorise Bullion III M.D   On: 10/23/2020 14:00 Note: Reviewed        Physical Exam  General appearance: Well nourished, well developed, and well hydrated. In no apparent acute distress Mental status: Alert, oriented x 3 (person, place, & time)       Respiratory: No evidence of acute respiratory distress Eyes: PERLA Vitals: BP 129/62   Pulse 68   Temp 97.7 F (36.5 C)   Ht _0  (1.727 m)   Wt 168 lb (76.2 kg)   SpO2 100%   BMI 25.54 kg/m  BMI: Estimated body mass index is 25.54 kg/m as calculated from the following:   Height as of this encounter: _1  (1.727 m).   Weight as of this encounter: 168 lb (76.2 kg). Ideal: Ideal body weight: 63.9 kg (140 lb 14 oz) Adjusted ideal body weight: 68.8 kg (151 lb 11.6 oz)   Low back pain, sacral pain Left knee pain, pain with weightbearing  Assessment   Status Diagnosis  Controlled Controlled Controlled 1. Chronic pain syndrome    2. Coccyodynia   3. Lumbar spondylosis   4. Sacroiliac joint pain   5. Controlled substance agreement signed   6. Lumbar degenerative disc disease   7. Arthritis of left knee       Plan of Care   Ms. ANALYSIA DUNGEE has a current medication list which includes the following long-term medication(s): furosemide, [START ON 06/14/2021] hydrocodone-acetaminophen, [START ON 07/14/2021] hydrocodone-acetaminophen, [START ON 08/13/2021] hydrocodone-acetaminophen, lyrica, sertraline, simvastatin, and trazodone.  Pharmacotherapy (Medications Ordered): Meds ordered this encounter  Medications  . HYDROcodone-acetaminophen (NORCO) 10-325 MG tablet    Sig: Take 1 tablet by mouth every 8 (eight) hours as needed for severe pain. Must last 30 days. For chronic pain.    Dispense:  90 tablet    Refill:  0  [place] STOP ACT - Not applicable. Fill one day early if pharmacy is closed on scheduled refill date.  Marland Kitchen HYDROcodone-acetaminophen (NORCO) 10-325 MG tablet    Sig: Take 1 tablet by mouth every 8 (eight) hours as needed for severe pain. Must last 30 days. For chronic pain.    Dispense:  90 tablet    Refill:  0    [place] STOP ACT - Not applicable. Fill one day early if pharmacy is closed on scheduled refill date.  Marland Kitchen HYDROcodone-acetaminophen (NORCO) 10-325 MG tablet    Sig: Take 1 tablet by mouth every 8 (eight) hours as needed for severe pain. Must last 30 days. For chronic pain.    Dispense:  90 tablet    Refill:  0     STOP ACT - Not applicable. Fill one day early if pharmacy is closed on scheduled refill date.   Orders:  Orders Placed This Encounter  Procedures  . KNEE INJECTION    For knee pain. Monovysc    Standing Status:   Standing    Number of Occurrences:   5    Standing Expiration Date:   05/25/2022    Scheduling Instructions:     Procedure: Intra-articular Hyalgan Knee injection            Side(s): LEFT     Sedation: None     TIMEFRAME: PRN procedure. (Ms. Holden will call when needed.)     Order Specific Question:   Where will this procedure be performed?    Answer:   [facility]   Follow-up plan:   Return in about 3 months (around 08/25/2021) for Medication Management, in person.     status post left L3, L4, L5, S1 RFA on 07/06/2019 and right L3, L4, L5, S1 RFA on 07/22/2019-      Recent Visits Date Type Provider Dept  03/02/21 Office Visit Gillis Santa, MD [facility]  Showing recent visits within past 90 days and meeting all other requirements Today's Visits Date Type Provider Dept  05/25/21 Office Visit Gillis Santa, MD [facility]  Showing today's visits and meeting all other requirements Future Appointments No visits were found meeting these conditions. Showing future appointments within next 90 days and meeting all other requirements  I discussed the assessment and treatment plan with the patient. The patient was provided an opportunity to ask questions and all were answered. The patient agreed with the plan and demonstrated an understanding of the instructions.  Patient advised to call back or seek an in-person evaluation if the symptoms or condition worsens.  Duration of encounter:30 minutes.  Note by: Gillis Santa, MD Date: 05/25/2021; Time: 9:10 AM

## 2021-05-25 NOTE — Progress Notes (Signed)
Nursing Pain Medication Assessment:  Safety precautions to be maintained throughout the outpatient stay will include: orient to surroundings, keep bed in low position, maintain call bell within reach at all times, provide assistance with transfer out of bed and ambulation.  Medication Inspection Compliance: Pill count conducted under aseptic conditions, in front of the patient. Neither the pills nor the bottle was removed from the patient's sight at any time. Once count was completed pills were immediately returned to the patient in their original bottle.  Medication: Hydrocodone/APAP Pill/Patch Count: 64 of 90 pills remain Pill/Patch Appearance: Markings consistent with prescribed medication Bottle Appearance: Standard pharmacy container. Clearly labeled. Filled Date: 5 / 65 / 22 Last Medication intake:  TodaySafety precautions to be maintained throughout the outpatient stay will include: orient to surroundings, keep bed in low position, maintain call bell within reach at all times, provide assistance with transfer out of bed and ambulation.

## 2021-08-23 ENCOUNTER — Encounter: Payer: Self-pay | Admitting: Student in an Organized Health Care Education/Training Program

## 2021-08-23 ENCOUNTER — Ambulatory Visit
Payer: BC Managed Care – PPO | Attending: Student in an Organized Health Care Education/Training Program | Admitting: Student in an Organized Health Care Education/Training Program

## 2021-08-23 ENCOUNTER — Other Ambulatory Visit: Payer: Self-pay

## 2021-08-23 VITALS — BP 127/80 | HR 68 | Temp 97.3°F | Resp 16 | Ht 67.0 in | Wt 162.0 lb

## 2021-08-23 DIAGNOSIS — M5136 Other intervertebral disc degeneration, lumbar region: Secondary | ICD-10-CM | POA: Diagnosis present

## 2021-08-23 DIAGNOSIS — M47816 Spondylosis without myelopathy or radiculopathy, lumbar region: Secondary | ICD-10-CM

## 2021-08-23 DIAGNOSIS — Z79899 Other long term (current) drug therapy: Secondary | ICD-10-CM | POA: Diagnosis present

## 2021-08-23 DIAGNOSIS — G894 Chronic pain syndrome: Secondary | ICD-10-CM

## 2021-08-23 DIAGNOSIS — M1712 Unilateral primary osteoarthritis, left knee: Secondary | ICD-10-CM | POA: Diagnosis not present

## 2021-08-23 DIAGNOSIS — M533 Sacrococcygeal disorders, not elsewhere classified: Secondary | ICD-10-CM | POA: Diagnosis not present

## 2021-08-23 MED ORDER — HYDROCODONE-ACETAMINOPHEN 10-325 MG PO TABS: 1.0000 | ORAL_TABLET | Freq: Three times a day (TID) | ORAL | 0 refills | Status: DC | PRN

## 2021-08-23 NOTE — Progress Notes (Signed)
Nursing Pain Medication Assessment:  Safety precautions to be maintained throughout the outpatient stay will include: orient to surroundings, keep bed in low position, maintain call bell within reach at all times, provide assistance with transfer out of bed and ambulation.  Medication Inspection Compliance: Pill count conducted under aseptic conditions, in front of the patient. Neither the pills nor the bottle was removed from the patient's sight at any time. Once count was completed pills were immediately returned to the patient in their original bottle.  Medication: Hydrocodone/APAP Pill/Patch Count:  70.5 of 90 pills remain Pill/Patch Appearance: Markings consistent with prescribed medication Bottle Appearance: Standard pharmacy container. Clearly labeled. Filled Date: 08 / 16 / 2022 Last Medication intake:  Today

## 2021-08-23 NOTE — Progress Notes (Signed)
PROVIDER NOTE: Information contained herein reflects review and annotations entered in association with encounter. Interpretation of such information and data should be left to medically-trained personnel. Information provided to patient can be located elsewhere in the medical record under "Patient Instructions". Document created using STT-dictation technology, any transcriptional errors that may result from process are unintentional.    Patient: Vanessa Greer  Service Category: E/M  Provider: Gillis Santa, MD  DOB: [birth year]  DOS: 08/23/2021  Specialty: Interventional Pain Management  MRN: [id number]  Setting: Ambulatory outpatient  PCP: Adin Hector, MD  Type: Established Patient    Referring Provider: Adin Hector, MD  Location: Office  Delivery: Face-to-face     HPI  Vanessa Greer, a 60 y.o. year old female, is here today because of her Chronic pain syndrome [G89.4]. Vanessa Greer primary complain today is Back Pain (low) Last encounter: My last encounter with her was on 05/25/2021. Pertinent problems: Ms. Trunnell has Chronic bilateral low back pain with bilateral sciatica; Lumbar facet arthropathy; Lumbar spondylosis; Lumbar degenerative disc disease; Chronic pain syndrome; and Coccyodynia on their pertinent problem list. Pain Assessment: Severity of Chronic pain is reported as a 0-No pain/10. Location: Back Lower/denies. Onset: More than a month ago. Quality: Aching, Tiring. Timing: Constant. Modifying factor(s): medication, shower. Vitals:  height is _0  (1.702 m) and weight is 162 lb (73.5 kg). Her temperature is 97.3 F (36.3 C) (abnormal). Her blood pressure is 127/80 and her pulse is 68. Her respiration is 16 and oxygen saturation is 100%.   Reason for encounter: medication management.     Since last visit with me, diagnosed with OSA, utilizing CPAP. States that she is waking up feeling more rested.  Patient's pain is at baseline.  Patient continues multimodal pain  regimen as prescribed.  States that it provides pain relief and improvement in functional status. Continues Lyrica Rx'd by PCP. No constipation    08/15/2021  05/25/2021   1  Hydrocodone-Acetamin 10-325 Mg 90.00  30  Bi Lat  [id number]  [facility] (5798)  0/0  30.00 MME  Comm Ins  [place]     Pharmacotherapy Assessment  Analgesic: Norco 10 mg TID prn   Monitoring: [place] PMP: PDMP reviewed during this encounter.       Pharmacotherapy: No side-effects or adverse reactions reported. Compliance: No problems identified. Effectiveness: Clinically acceptable.  Dewayne Shorter, RN  08/23/2021  8:12 AM  Sign when Signing Visit Nursing Pain Medication Assessment:  Safety precautions to be maintained throughout the outpatient stay will include: orient to surroundings, keep bed in low position, maintain call bell within reach at all times, provide assistance with transfer out of bed and ambulation.  Medication Inspection Compliance: Pill count conducted under aseptic conditions, in front of the patient. Neither the pills nor the bottle was removed from the patient's sight at any time. Once count was completed pills were immediately returned to the patient in their original bottle.  Medication: Hydrocodone/APAP Pill/Patch Count:  70.5 of 90 pills remain Pill/Patch Appearance: Markings consistent with prescribed medication Bottle Appearance: Standard pharmacy container. Clearly labeled. Filled Date: 08 / 16 / 2022 Last Medication intake:  Today    UDS:  Summary  Date Value Ref Range Status  08/23/2020 Note  Final    Comment:    ==================================================================== ToxASSURE Select 13 (MW) ==================================================================== Test  Result       Flag       Units  Drug Present and Declared for Prescription Verification   Hydrocodone                    2403         EXPECTED   ng/mg creat   Hydromorphone                  449           EXPECTED   ng/mg creat   Dihydrocodeine                 180          EXPECTED   ng/mg creat   Norhydrocodone                 >2283        EXPECTED   ng/mg creat    Sources of hydrocodone include scheduled prescription medications.    Hydromorphone, dihydrocodeine and norhydrocodone are expected    metabolites of hydrocodone. Hydromorphone and dihydrocodeine are    also available as scheduled prescription medications.  ==================================================================== Test                      Result    Flag   Units      Ref Range   Creatinine              219              mg/dL      >=20 ==================================================================== Declared Medications:  The flagging and interpretation on this report are based on the  following declared medications.  Unexpected results may arise from  inaccuracies in the declared medications.   **Note: The testing scope of this panel includes these medications:   Hydrocodone (Norco)   **Note: The testing scope of this panel does not include the  following reported medications:   Acetaminophen (Norco)  Cyclobenzaprine (Flexeril)  Furosemide (Lasix)  Iron  Multivitamin  Pantoprazole (Protonix)  Pregabalin (Lyrica)  Sertraline (Zoloft)  Simvastatin (Zocor)  Trazodone (Desyrel)  Turmeric  Vitamin D3 ==================================================================== For clinical consultation, please call [phone]. ====================================================================      ROS  Constitutional: Denies any fever or chills Gastrointestinal: No reported hemesis, hematochezia, vomiting, or acute GI distress Musculoskeletal: Denies any acute onset joint swelling, redness, loss of ROM, or weakness Neurological: No reported episodes of acute onset apraxia, aphasia, dysarthria, agnosia, amnesia, paralysis, loss of coordination, or loss of consciousness  Medication Review   HYDROcodone-acetaminophen, Omega-3 Fatty Acids, Vitamin D3, aspirin, cyclobenzaprine, furosemide, multivitamin with minerals, pantoprazole, pregabalin, sertraline, simvastatin, and traZODone  History Review  Allergy: Ms. Ebrahim is allergic to bupropion. Drug: Ms. Trego  reports no history of drug use. Alcohol:  reports current alcohol use. Tobacco:  reports that she has been smoking cigarettes. She has a 20.50 pack-year smoking history. She has never used smokeless tobacco. Social: Ms. Kabel  reports that she has been smoking cigarettes. She has a 20.50 pack-year smoking history. She has never used smokeless tobacco. She reports current alcohol use. She reports that she does not use drugs. Medical:  has a past medical history of Anxiety, Back pain, DDD (degenerative disc disease), lumbar, Depression, GERD (gastroesophageal reflux disease), High cholesterol, Lumbar radiculitis, Pneumothorax (1982), and Sleep apnea. Surgical: Ms. Demo  has a past surgical history that includes Abdominal hysterectomy; Foot surgery; Ectopic pregnancy surgery; Colonoscopy; Laser ablation condolamata (N/A,  12/13/2017); and Esophagogastroduodenoscopy (egd) with propofol (N/A, 03/17/2019). Family: family history includes Breast cancer (age of onset: 30) in her mother.  Laboratory Chemistry Profile   Renal No results found for: BUN, CREATININE, LABCREA, BCR, GFR, GFRAA, GFRNONAA, LABVMA, EPIRU, EPINEPH24HUR, NOREPRU, NOREPI24HUR, DOPARU, [id number]  Hepatic No results found for: AST, ALT, ALBUMIN, ALKPHOS, HCVAB, AMYLASE, LIPASE, AMMONIA  Electrolytes No results found for: NA, K, CL, CALCIUM, MG, PHOS  Bone No results found for: VD25OH, [id number], XM4680HO1, YY4825OI3, 25OHVITD1, 25OHVITD2, 25OHVITD3, TESTOFREE, TESTOSTERONE  Inflammation (CRP: Acute Phase) (ESR: Chronic Phase) No results found for: CRP, ESRSEDRATE, LATICACIDVEN       Note: Above Lab results reviewed.  Recent Imaging Review  MM 3D  SCREEN BREAST BILATERAL CLINICAL DATA:  Screening.  EXAM: DIGITAL SCREENING BILATERAL MAMMOGRAM WITH TOMO AND CAD  COMPARISON:  Previous exam(s).  ACR Breast Density Category b: There are scattered areas of fibroglandular density.  FINDINGS: There are no findings suspicious for malignancy. Images were processed with CAD.  IMPRESSION: No mammographic evidence of malignancy. A result letter of this screening mammogram will be mailed directly to the patient.  RECOMMENDATION: Screening mammogram in one year. (Code:SM-B-01Y)  BI-RADS CATEGORY  1: Negative.  Electronically Signed   By: Dorise Bullion III M.D   On: 10/23/2020 14:00 Note: Reviewed        Physical Exam  General appearance: Well nourished, well developed, and well hydrated. In no apparent acute distress Mental status: Alert, oriented x 3 (person, place, & time)       Respiratory: No evidence of acute respiratory distress Eyes: PERLA Vitals: BP 127/80 (BP Location: Right Arm, Patient Position: Sitting, Cuff Size: Normal)   Pulse 68   Temp (!) 97.3 F (36.3 C)   Resp 16   Ht _0  (1.702 m)   Wt 162 lb (73.5 kg)   SpO2 100%   BMI 25.37 kg/m  BMI: Estimated body mass index is 25.37 kg/m as calculated from the following:   Height as of this encounter: _1  (1.702 m).   Weight as of this encounter: 162 lb (73.5 kg). Ideal: Ideal body weight: 61.6 kg (135 lb 12.9 oz) Adjusted ideal body weight: 66.4 kg (146 lb 4.5 oz)  Assessment   Status Diagnosis  Controlled Controlled Controlled 1. Chronic pain syndrome   2. Coccyodynia   3. Lumbar spondylosis   4. Arthritis of left knee   5. Sacroiliac joint pain   6. Controlled substance agreement signed   7. Lumbar degenerative disc disease        Plan of Care  Problem-specific:  No problem-specific Assessment & Plan notes found for this encounter.  Ms. BRYNLEA SPINDLER has a current medication list which includes the following long-term medication(s):  furosemide, lyrica, sertraline, simvastatin, trazodone, [START ON 09/14/2021] hydrocodone-acetaminophen, [START ON 10/14/2021] hydrocodone-acetaminophen, and [START ON 11/13/2021] hydrocodone-acetaminophen.  Pharmacotherapy (Medications Ordered): Meds ordered this encounter  Medications   HYDROcodone-acetaminophen (NORCO) 10-325 MG tablet    Sig: Take 1 tablet by mouth every 8 (eight) hours as needed for severe pain. Must last 30 days. For chronic pain.    Dispense:  90 tablet    Refill:  0    [place] STOP ACT - Not applicable. Fill one day early if pharmacy is closed on scheduled refill date.   HYDROcodone-acetaminophen (NORCO) 10-325 MG tablet    Sig: Take 1 tablet by mouth every 8 (eight) hours as needed for severe pain. Must last 30 days. For chronic pain.    Dispense:  90 tablet    Refill:  0    [place] STOP ACT - Not applicable. Fill one day early if pharmacy is closed on scheduled refill date.   HYDROcodone-acetaminophen (NORCO) 10-325 MG tablet    Sig: Take 1 tablet by mouth every 8 (eight) hours as needed for severe pain. Must last 30 days. For chronic pain.    Dispense:  90 tablet    Refill:  0     STOP ACT - Not applicable. Fill one day early if pharmacy is closed on scheduled refill date.    Orders:  Orders Placed This Encounter  Procedures   ToxASSURE Select 13 (MW), Urine    Volume: 30 ml(s). Minimum 3 ml of urine is needed. Document temperature of fresh sample. Indications: Long term (current) use of opiate analgesic [phone])    Order Specific Question:   Release to patient    Answer:   Immediate    Follow-up plan:   Return in about 3 months (around 12/05/2021) for Medication Management, in person.     status post left L3, L4, L5, S1 RFA on 07/06/2019 and right L3, L4, L5, S1 RFA on 07/22/2019-       Recent Visits Date Type Provider Dept  05/25/21 Office Visit Gillis Santa, MD [facility]  Showing recent visits within past 90 days and meeting all other  requirements Today's Visits Date Type Provider Dept  08/23/21 Office Visit Gillis Santa, MD [facility]  Showing today's visits and meeting all other requirements Future Appointments No visits were found meeting these conditions. Showing future appointments within next 90 days and meeting all other requirements I discussed the assessment and treatment plan with the patient. The patient was provided an opportunity to ask questions and all were answered. The patient agreed with the plan and demonstrated an understanding of the instructions.  Patient advised to call back or seek an in-person evaluation if the symptoms or condition worsens.  Duration of encounter: 35mnutes.  Note by: BGillis Santa MD Date: 08/23/2021; Time: 8:20 AM

## 2021-08-24 ENCOUNTER — Encounter: Payer: BC Managed Care – PPO | Admitting: Student in an Organized Health Care Education/Training Program

## 2021-08-26 LAB — TOXASSURE SELECT 13 (MW), URINE

## 2021-08-29 ENCOUNTER — Encounter: Payer: BC Managed Care – PPO | Admitting: Student in an Organized Health Care Education/Training Program

## 2021-08-31 ENCOUNTER — Encounter: Payer: BC Managed Care – PPO | Admitting: Student in an Organized Health Care Education/Training Program

## 2021-11-16 ENCOUNTER — Other Ambulatory Visit: Payer: Self-pay | Admitting: Internal Medicine

## 2021-11-16 DIAGNOSIS — Z1231 Encounter for screening mammogram for malignant neoplasm of breast: Secondary | ICD-10-CM

## 2021-11-29 ENCOUNTER — Encounter: Payer: Self-pay | Admitting: Student in an Organized Health Care Education/Training Program

## 2021-11-30 ENCOUNTER — Other Ambulatory Visit: Payer: Self-pay

## 2021-11-30 ENCOUNTER — Encounter: Payer: Self-pay | Admitting: Student in an Organized Health Care Education/Training Program

## 2021-11-30 ENCOUNTER — Ambulatory Visit
Payer: BC Managed Care – PPO | Attending: Student in an Organized Health Care Education/Training Program | Admitting: Student in an Organized Health Care Education/Training Program

## 2021-11-30 DIAGNOSIS — G894 Chronic pain syndrome: Secondary | ICD-10-CM

## 2021-11-30 DIAGNOSIS — M47816 Spondylosis without myelopathy or radiculopathy, lumbar region: Secondary | ICD-10-CM | POA: Diagnosis not present

## 2021-11-30 DIAGNOSIS — M1712 Unilateral primary osteoarthritis, left knee: Secondary | ICD-10-CM | POA: Diagnosis not present

## 2021-11-30 DIAGNOSIS — Z79899 Other long term (current) drug therapy: Secondary | ICD-10-CM

## 2021-11-30 DIAGNOSIS — M533 Sacrococcygeal disorders, not elsewhere classified: Secondary | ICD-10-CM

## 2021-11-30 MED ORDER — HYDROCODONE-ACETAMINOPHEN 10-325 MG PO TABS: 1.0000 | ORAL_TABLET | Freq: Three times a day (TID) | ORAL | 0 refills | Status: DC | PRN

## 2021-11-30 NOTE — Progress Notes (Signed)
Patient: Vanessa Greer  Service Category: E/M  Provider: Edward Jolly, MD  DOB: 20-Mar-1961  DOS: 11/30/2021  Location: Office  MRN: [id number]  Setting: Ambulatory outpatient  Referring Provider: Lynnea Ferrier, MD  Type: Established Patient  Specialty: Interventional Pain Management  PCP: Lynnea Ferrier, MD  Location: Remote location  Delivery: TeleHealth     Virtual Encounter - Pain Management PROVIDER NOTE: Information contained herein reflects review and annotations entered in association with encounter. Interpretation of such information and data should be left to medically-trained personnel. Information provided to patient can be located elsewhere in the medical record under "Patient Instructions". Document created using STT-dictation technology, any transcriptional errors that may result from process are unintentional.    Contact & Pharmacy Preferred: [phone] Home: [phone] (home) Mobile: [phone] (mobile) E-mail: [email or web address]  [facility] - [place], [place] - [address] [place] [address] Phone: [phone] Fax: [phone]  [id number] [place], [place] - [address] AT [facility] [address] [place] [address] [place] [address] Phone: [phone] Fax: [phone]   Pre-screening  Ms. Sherral Hammers offered "in-person" vs "virtual" encounter. She indicated preferring virtual for this encounter.   Reason COVID-19*  Social distancing based on CDC and AMA recommendations.   I contacted [name] on 11/30/2021 via telephone.      I clearly identified myself as Edward Jolly, MD. I verified that I was speaking with the correct person using two identifiers (Name: CHANDELLE Greer, and date of birth: [birth year]).  Consent I sought verbal advanced consent from [name] for virtual visit interactions. I informed Ms. Hergert of possible security and privacy concerns, risks, and  limitations associated with providing "not-in-person" medical evaluation and management services. I also informed Ms. Gell of the availability of "in-person" appointments. Finally, I informed her that there would be a charge for the virtual visit and that she could be  personally, fully or partially, financially responsible for it. Ms. Tibbetts expressed understanding and agreed to proceed.   Historic Elements   [name] is a 60 y.o. year old, female patient evaluated today after our last contact on 08/23/2021. Vanessa Greer  has a past medical history of Anxiety, Back pain, DDD (degenerative disc disease), lumbar, Depression, GERD (gastroesophageal reflux disease), High cholesterol, Lumbar radiculitis, Pneumothorax (1982), and Sleep apnea. She also  has a past surgical history that includes Abdominal hysterectomy; Foot surgery; Ectopic pregnancy surgery; Colonoscopy; Laser ablation condolamata (N/A, 12/13/2017); and Esophagogastroduodenoscopy (egd) with propofol (N/A, 03/17/2019). Vanessa Greer has a current medication list which includes the following prescription(s): aspirin, cyclobenzaprine, lyrica, multivitamin with minerals, pantoprazole, sertraline, simvastatin, trazodone, furosemide, [START ON 12/16/2021] hydrocodone-acetaminophen, [START ON 01/15/2022] hydrocodone-acetaminophen, and [START ON 02/14/2022] hydrocodone-acetaminophen. She  reports that she has been smoking cigarettes. She has a 20.50 pack-year smoking history. She has never used smokeless tobacco. She reports current alcohol use. She reports that she does not use drugs. Vanessa Greer is allergic to bupropion.   HPI  Today, she is being contacted for medication management.  No change in medical history since last visit.  Patient's pain is at baseline.  Patient continues multimodal pain regimen as prescribed.  States that it provides pain relief and improvement in functional status.   Pharmacotherapy Assessment   Analgesic: Norco  10 mg TID prn   Monitoring: [place] PMP: PDMP reviewed during this encounter.  Pharmacotherapy: No side-effects or adverse reactions reported. Compliance: No problems identified. Effectiveness: Clinically acceptable. Plan: Refer to "POC". UDS:  Summary  Date Value Ref Range Status  08/23/2021 Note  Final    Comment:    ==================================================================== ToxASSURE Select 13 (MW) ==================================================================== Test                             Result       Flag       Units  Drug Present and Declared for Prescription Verification   Hydrocodone                    1603         EXPECTED   ng/mg creat   Hydromorphone                  297          EXPECTED   ng/mg creat   Dihydrocodeine                 176          EXPECTED   ng/mg creat   Norhydrocodone                 5235         EXPECTED   ng/mg creat    Sources of hydrocodone include scheduled prescription medications.    Hydromorphone, dihydrocodeine and norhydrocodone are expected    metabolites of hydrocodone. Hydromorphone and dihydrocodeine are    also available as scheduled prescription medications.  ==================================================================== Test                      Result    Flag   Units      Ref Range   Creatinine              34               mg/dL      >=55 ==================================================================== Declared Medications:  The flagging and interpretation on this report are based on the  following declared medications.  Unexpected results may arise from  inaccuracies in the declared medications.   **Note: The testing scope of this panel includes these medications:   Hydrocodone (Norco)   **Note: The testing scope of this panel does not include the  following reported medications:   Acetaminophen (Norco)  Aspirin  Cholecalciferol  Cyclobenzaprine (Flexeril)  Furosemide (Lasix)  Multivitamin   Pantoprazole (Protonix)  Pregabalin (Lyrica)  Sertraline (Zoloft)  Trazodone (Desyrel) ==================================================================== For clinical consultation, please call [phone]. ====================================================================       Imaging  MM 3D SCREEN BREAST BILATERAL CLINICAL DATA:  Screening.  EXAM: DIGITAL SCREENING BILATERAL MAMMOGRAM WITH TOMO AND CAD  COMPARISON:  Previous exam(s).  ACR Breast Density Category b: There are scattered areas of fibroglandular density.  FINDINGS: There are no findings suspicious for malignancy. Images were processed with CAD.  IMPRESSION: No mammographic evidence of malignancy. A result letter of this screening mammogram will be mailed directly to the patient.  RECOMMENDATION: Screening mammogram in one year. (Code:SM-B-01Y)  BI-RADS CATEGORY  1: Negative.  Electronically Signed   By: Gerome Sam III M.D   On: 10/23/2020 14:00  Assessment  The primary encounter diagnosis was Chronic pain syndrome. Diagnoses of Coccyodynia, Lumbar spondylosis, Arthritis of left knee, Sacroiliac joint pain, and Controlled substance agreement signed were also pertinent to this visit.  Plan of Care  Ms. NATHALIE CAVENDISH has a current medication list which includes the following long-term medication(s): lyrica, sertraline, simvastatin, trazodone, furosemide, [START ON 12/16/2021] hydrocodone-acetaminophen, [START ON 01/15/2022] hydrocodone-acetaminophen, and [START ON 02/14/2022] hydrocodone-acetaminophen.  Pharmacotherapy (Medications Ordered): Meds ordered this encounter  Medications   HYDROcodone-acetaminophen (NORCO) 10-325 MG tablet    Sig: Take 1 tablet by mouth every 8 (eight) hours as needed for severe pain. Must last 30 days. For chronic pain.    Dispense:  90 tablet    Refill:  0    [place] STOP ACT - Not applicable. Fill one day early if pharmacy is closed on scheduled refill date.    HYDROcodone-acetaminophen (NORCO) 10-325 MG tablet    Sig: Take 1 tablet by mouth every 8 (eight) hours as needed for severe pain. Must last 30 days. For chronic pain.    Dispense:  90 tablet    Refill:  0    [place] STOP ACT - Not applicable. Fill one day early if pharmacy is closed on scheduled refill date.   HYDROcodone-acetaminophen (NORCO) 10-325 MG tablet    Sig: Take 1 tablet by mouth every 8 (eight) hours as needed for severe pain. Must last 30 days. For chronic pain.    Dispense:  90 tablet    Refill:  0    [place] STOP ACT - Not applicable. Fill one day early if pharmacy is closed on scheduled refill date.      Follow-up plan:   Return in about 15 weeks (around 03/15/2022) for Medication Management, in person.     status post left L3, L4, L5, S1 RFA on 07/06/2019 and right L3, L4, L5, S1 RFA on 07/22/2019-        Recent Visits No visits were found meeting these conditions. Showing recent visits within past 90 days and meeting all other requirements Today's Visits Date Type Provider Dept  11/30/21 Office Visit Edward Jolly, MD [facility]  Showing today's visits and meeting all other requirements Future Appointments No visits were found meeting these conditions. Showing future appointments within next 90 days and meeting all other requirements I discussed the assessment and treatment plan with the patient. The patient was provided an opportunity to ask questions and all were answered. The patient agreed with the plan and demonstrated an understanding of the instructions.  Patient advised to call back or seek an in-person evaluation if the symptoms or condition worsens.  Duration of encounter: .  Note by: Edward Jolly, MD Date: 11/30/2021; Time: 8:53 AM

## 2022-02-01 ENCOUNTER — Ambulatory Visit
Admission: RE | Admit: 2022-02-01 | Discharge: 2022-02-01 | Disposition: A | Discharge status: Home / Self Care | Payer: BC Managed Care – PPO | Source: Ambulatory Visit | Attending: Internal Medicine | Admitting: Internal Medicine

## 2022-02-01 ENCOUNTER — Other Ambulatory Visit: Payer: Self-pay

## 2022-02-01 DIAGNOSIS — Z1231 Encounter for screening mammogram for malignant neoplasm of breast: Secondary | ICD-10-CM | POA: Insufficient documentation

## 2022-02-15 ENCOUNTER — Other Ambulatory Visit: Payer: Self-pay | Admitting: *Deleted

## 2022-02-15 DIAGNOSIS — F17210 Nicotine dependence, cigarettes, uncomplicated: Secondary | ICD-10-CM

## 2022-02-15 DIAGNOSIS — Z87891 Personal history of nicotine dependence: Secondary | ICD-10-CM

## 2022-02-28 ENCOUNTER — Ambulatory Visit
Admission: RE | Admit: 2022-02-28 | Discharge: 2022-02-28 | Disposition: A | Discharge status: Home / Self Care | Payer: BC Managed Care – PPO | Source: Ambulatory Visit | Attending: Acute Care | Admitting: Acute Care

## 2022-02-28 ENCOUNTER — Other Ambulatory Visit: Payer: Self-pay

## 2022-02-28 DIAGNOSIS — Z87891 Personal history of nicotine dependence: Secondary | ICD-10-CM | POA: Insufficient documentation

## 2022-02-28 DIAGNOSIS — F17210 Nicotine dependence, cigarettes, uncomplicated: Secondary | ICD-10-CM | POA: Diagnosis present

## 2022-03-02 ENCOUNTER — Other Ambulatory Visit: Payer: Self-pay

## 2022-03-02 DIAGNOSIS — Z87891 Personal history of nicotine dependence: Secondary | ICD-10-CM

## 2022-03-02 DIAGNOSIS — F17210 Nicotine dependence, cigarettes, uncomplicated: Secondary | ICD-10-CM

## 2022-03-19 ENCOUNTER — Other Ambulatory Visit: Payer: Self-pay

## 2022-03-19 ENCOUNTER — Ambulatory Visit
Payer: BC Managed Care – PPO | Attending: Student in an Organized Health Care Education/Training Program | Admitting: Student in an Organized Health Care Education/Training Program

## 2022-03-19 ENCOUNTER — Encounter: Payer: Self-pay | Admitting: Student in an Organized Health Care Education/Training Program

## 2022-03-19 VITALS — BP 136/54 | HR 71 | Temp 97.4°F | Resp 16 | Ht 67.0 in | Wt 162.0 lb

## 2022-03-19 DIAGNOSIS — G894 Chronic pain syndrome: Secondary | ICD-10-CM | POA: Diagnosis present

## 2022-03-19 DIAGNOSIS — Z79899 Other long term (current) drug therapy: Secondary | ICD-10-CM | POA: Insufficient documentation

## 2022-03-19 DIAGNOSIS — M533 Sacrococcygeal disorders, not elsewhere classified: Secondary | ICD-10-CM | POA: Diagnosis present

## 2022-03-19 DIAGNOSIS — M47816 Spondylosis without myelopathy or radiculopathy, lumbar region: Secondary | ICD-10-CM | POA: Diagnosis present

## 2022-03-19 MED ORDER — HYDROCODONE-ACETAMINOPHEN 10-325 MG PO TABS: 1.0000 | ORAL_TABLET | Freq: Three times a day (TID) | ORAL | 0 refills | Status: DC | PRN

## 2022-03-19 NOTE — Patient Instructions (Signed)
____________________________________________________________________________________________  Medication Rules  Purpose: To inform patients, and their family members, of our rules and regulations.  Applies to: All patients receiving prescriptions (written or electronic).  Pharmacy of record: Pharmacy where electronic prescriptions will be sent. If written prescriptions are taken to a different pharmacy, please inform the nursing staff. The pharmacy listed in the electronic medical record should be the one where you would like electronic prescriptions to be sent.  Electronic prescriptions: In compliance with the [place] Strengthen Opioid Misuse Prevention (STOP) Act of 2017 (Session Law 2017-74/H243), effective December 31, 2018, all controlled substances must be electronically prescribed. Calling prescriptions to the pharmacy will cease to exist.  Prescription refills: Only during scheduled appointments. Applies to all prescriptions.  NOTE: The following applies primarily to controlled substances (Opioid* Pain Medications).   Type of encounter (visit): For patients receiving controlled substances, face-to-face visits are required. (Not an option or up to the patient.)  Patient's responsibilities: Pain Pills: Bring all pain pills to every appointment (except for procedure appointments). Pill Bottles: Bring pills in original pharmacy bottle. Always bring the newest bottle. Bring bottle, even if empty. Medication refills: You are responsible for knowing and keeping track of what medications you take and those you need refilled. The day before your appointment: write a list of all prescriptions that need to be refilled. The day of the appointment: give the list to the admitting nurse. Prescriptions will be written only during appointments. No prescriptions will be written on procedure days. If you forget a medication: it will not be "Called in", "Faxed", or "electronically sent". You will  need to get another appointment to get these prescribed. No early refills. Do not call asking to have your prescription filled early. Prescription Accuracy: You are responsible for carefully inspecting your prescriptions before leaving our office. Have the discharge nurse carefully go over each prescription with you, before taking them home. Make sure that your name is accurately spelled, that your address is correct. Check the name and dose of your medication to make sure it is accurate. Check the number of pills, and the written instructions to make sure they are clear and accurate. Make sure that you are given enough medication to last until your next medication refill appointment. Taking Medication: Take medication as prescribed. When it comes to controlled substances, taking less pills or less frequently than prescribed is permitted and encouraged. Never take more pills than instructed. Never take medication more frequently than prescribed.  Inform other Doctors: Always inform, all of your healthcare providers, of all the medications you take. Pain Medication from other Providers: You are not allowed to accept any additional pain medication from any other Doctor or Healthcare provider. There are two exceptions to this rule. (see below) In the event that you require additional pain medication, you are responsible for notifying us, as stated below. Cough Medicine: Often these contain an opioid, such as codeine or hydrocodone. Never accept or take cough medicine containing these opioids if you are already taking an opioid* medication. The combination may cause respiratory failure and death. Medication Agreement: You are responsible for carefully reading and following our Medication Agreement. This must be signed before receiving any prescriptions from our practice. Safely store a copy of your signed Agreement. Violations to the Agreement will result in no further prescriptions. (Additional copies of our  Medication Agreement are available upon request.) Laws, Rules, & Regulations: All patients are expected to follow all Federal and State Laws, Statutes, Rules, & Regulations. Ignorance of   the Laws does not constitute a valid excuse.  Illegal drugs and Controlled Substances: The use of illegal substances (including, but not limited to marijuana and its derivatives) and/or the illegal use of any controlled substances is strictly prohibited. Violation of this rule may result in the immediate and permanent discontinuation of any and all prescriptions being written by our practice. The use of any illegal substances is prohibited. Adopted CDC guidelines & recommendations: Target dosing levels will be at or below 60 MME/day. Use of benzodiazepines** is not recommended.  Exceptions: There are only two exceptions to the rule of not receiving pain medications from other Healthcare Providers. Exception #1 (Emergencies): In the event of an emergency (i.e.: accident requiring emergency care), you are allowed to receive additional pain medication. However, you are responsible for: As soon as you are able, call our office [phone], at any time of the day or night, and leave a message stating your name, the date and nature of the emergency, and the name and dose of the medication prescribed. In the event that your call is answered by a member of our staff, make sure to document and save the date, time, and the name of the person that took your information.  Exception #2 (Planned Surgery): In the event that you are scheduled by another doctor or dentist to have any type of surgery or procedure, you are allowed (for a period no longer than 30 days), to receive additional pain medication, for the acute post-op pain. However, in this case, you are responsible for picking up a copy of our "Post-op Pain Management for Surgeons" handout, and giving it to your surgeon or dentist. This document is available at our office, and  does not require an appointment to obtain it. Simply go to our office during business hours (Monday-Thursday from 8:00 AM to 4:00 PM) (Friday 8:00 AM to 12:00 Noon) or if you have a scheduled appointment with us, prior to your surgery, and ask for it by name. In addition, you are responsible for: calling our office [phone], at any time of the day or night, and leaving a message stating your name, name of your surgeon, type of surgery, and date of procedure or surgery. Failure to comply with your responsibilities may result in termination of therapy involving the controlled substances. Medication Agreement Violation. Following the above rules, including your responsibilities will help you in avoiding a Medication Agreement Violation ("Breaking your Pain Medication Contract").  *Opioid medications include: morphine, codeine, oxycodone, oxymorphone, hydrocodone, hydromorphone, meperidine, tramadol, tapentadol, buprenorphine, fentanyl, methadone. **Benzodiazepine medications include: diazepam (Valium), alprazolam (Xanax), clonazepam (Klonopine), lorazepam (Ativan), clorazepate (Tranxene), chlordiazepoxide (Librium), estazolam (Prosom), oxazepam (Serax), temazepam (Restoril), triazolam (Halcion) (Last updated: 09/27/2021) ____________________________________________________________________________________________   

## 2022-03-19 NOTE — Progress Notes (Signed)
PROVIDER NOTE: Information contained herein reflects review and annotations entered in association with encounter. Interpretation of such information and data should be left to medically-trained personnel. Information provided to patient can be located elsewhere in the medical record under "Patient Instructions". Document created using STT-dictation technology, any transcriptional errors that may result from process are unintentional.  ?  ?Patient: Vanessa Greer  Service Category: E/M  Provider: Gillis Santa, MD  ?DOB: [birth year]  DOS: 03/19/2022  Specialty: Interventional Pain Management  ?MRN: [id number]  Setting: Ambulatory outpatient  PCP: Adin Hector, MD  ?Type: Established Patient    Referring Provider: Adin Hector, MD  ?Location: Office  Delivery: Face-to-face    ? ?HPI  ?Vanessa Greer, a 61 y.o. year old female, is here today because of her Chronic pain syndrome [G89.4]. Vanessa Greer primary complain today is Back Pain (Lower/) ? ?Last encounter: My last encounter with her was on 11/30/21 ?Pertinent problems: Vanessa Greer has Chronic bilateral low back pain with bilateral sciatica; Lumbar facet arthropathy; Lumbar spondylosis; Lumbar degenerative disc disease; Chronic pain syndrome; and Coccyodynia on their pertinent problem list. ?Pain Assessment: Severity of Chronic pain is reported as a 4 /10. Location: Back Lower/denies. Onset: More than a month ago. Quality: Aching, Constant, Discomfort, Squeezing. Timing: Constant. Modifying factor(s): medication, rest, heat. ?Vitals:  height is _0  (1.702 m) and weight is 162 lb (73.5 kg). Her temperature is 97.4 ?F (36.3 ?C) (abnormal). Her blood pressure is 136/54 (abnormal) and her pulse is 71. Her respiration is 16 and oxygen saturation is 100%.  ? ?Reason for encounter: medication management.   ? ?Patient's pain is at baseline.  Patient continues multimodal pain regimen as prescribed.  States that it provides pain relief and improvement in  functional status. ?Continues Lyrica Rx'd by PCP. ?No constipation ? ? ? ?Pharmacotherapy Assessment  ?Analgesic: Norco 10 mg TID prn  ? ?Monitoring: ?[place] PMP: PDMP reviewed during this encounter.       ?Pharmacotherapy: No side-effects or adverse reactions reported. ?Compliance: No problems identified. ?Effectiveness: Clinically acceptable. ? ?Ignatius Specking, RN  03/19/2022 11:45 AM  Sign when Signing Visit ?Nursing Pain Medication Assessment:  ?Safety precautions to be maintained throughout the outpatient stay will include: orient to surroundings, keep bed in low position, maintain call bell within reach at all times, provide assistance with transfer out of bed and ambulation.  ?Medication Inspection Compliance: Pill count conducted under aseptic conditions, in front of the patient. Neither the pills nor the bottle was removed from the patient's sight at any time. Once count was completed pills were immediately returned to the patient in their original bottle. ? ?Medication: See above ?Pill/Patch Count:  0 of 90 pills remain ?Pill/Patch Appearance: Markings consistent with prescribed medication ?Bottle Appearance: Standard pharmacy container. Clearly labeled. ?Filled Date: 2 / 57 / 2023 ?Last Medication intake:  Today  UDS:  ?Summary  ?Date Value Ref Range Status  ?08/23/2021 Note  Final  ?  Comment:  ?  ==================================================================== ?[employer] Select 13 (MW) ?==================================================================== ?Test                             Result       Flag       Units ? ?Drug Present and Declared for Prescription Verification ?  Hydrocodone                    1603  EXPECTED   ng/mg creat ?  Hydromorphone                  297          EXPECTED   ng/mg creat ?  Dihydrocodeine                 176          EXPECTED   ng/mg creat ?  Norhydrocodone                 5235         EXPECTED   ng/mg creat ?   Sources of hydrocodone include scheduled  prescription medications. ?   Hydromorphone, dihydrocodeine and norhydrocodone are expected ?   metabolites of hydrocodone. Hydromorphone and dihydrocodeine are ?   also available as scheduled prescription medications. ? ?==================================================================== ?Test                      Result    Flag   Units      Ref Range ?  Creatinine              34               mg/dL      >=20 ?==================================================================== ?Declared Medications: ? The flagging and interpretation on this report are based on the ? following declared medications.  Unexpected results may arise from ? inaccuracies in the declared medications. ? ? **Note: The testing scope of this panel includes these medications: ? ? Hydrocodone (Norco) ? ? **Note: The testing scope of this panel does not include the ? following reported medications: ? ? Acetaminophen (Norco) ? Aspirin ? Cholecalciferol ? Cyclobenzaprine (Flexeril) ? Furosemide (Lasix) ? Multivitamin ? Pantoprazole (Protonix) ? Pregabalin (Lyrica) ? Sertraline (Zoloft) ? Trazodone (Desyrel) ?==================================================================== ?For clinical consultation, please call [phone]. ?==================================================================== ?  ?  ? ?ROS  ?Constitutional: Denies any fever or chills ?Gastrointestinal: No reported hemesis, hematochezia, vomiting, or acute GI distress ?Musculoskeletal: Denies any acute onset joint swelling, redness, loss of ROM, or weakness ?Neurological: No reported episodes of acute onset apraxia, aphasia, dysarthria, agnosia, amnesia, paralysis, loss of coordination, or loss of consciousness ? ?Medication Review  ?HYDROcodone-acetaminophen, aspirin, cyclobenzaprine, furosemide, multivitamin with minerals, pantoprazole, pregabalin, sertraline, simvastatin, and traZODone ? ?History Review  ?Allergy: Vanessa Greer is allergic to bupropion. ?Drug: Vanessa Greer   reports no history of drug use. ?Alcohol:  reports current alcohol use. ?Tobacco:  reports that she has been smoking cigarettes. She has a 20.50 pack-year smoking history. She has never used smokeless tobacco. ?Social: Ms. Zambrana  reports that she has been smoking cigarettes. She has a 20.50 pack-year smoking history. She has never used smokeless tobacco. She reports current alcohol use. She reports that she does not use drugs. ?Medical:  has a past medical history of Anxiety, Back pain, DDD (degenerative disc disease), lumbar, Depression, GERD (gastroesophageal reflux disease), High cholesterol, Lumbar radiculitis, Pneumothorax (1982), and Sleep apnea. ?Surgical: Ms. Dugo  has a past surgical history that includes Abdominal hysterectomy; Foot surgery; Ectopic pregnancy surgery; Colonoscopy; Laser ablation condolamata (N/A, 12/13/2017); and Esophagogastroduodenoscopy (egd) with propofol (N/A, 03/17/2019). ?Family: family history includes Breast cancer (age of onset: 71) in her mother. ? ?Laboratory Chemistry Profile  ? ?Renal ?No results found for: BUN, CREATININE, LABCREA, BCR, GFR, GFRAA, GFRNONAA, LABVMA, EPIRU, EPINEPH24HUR, NOREPRU, NOREPI24HUR, DOPARU, [id number]  Hepatic ?No results found for: AST, ALT, ALBUMIN, ALKPHOS, HCVAB, AMYLASE, LIPASE, AMMONIA  ?Electrolytes ?No  results found for: NA, K, CL, CALCIUM, MG, PHOS  Bone ?No results found for: [place], [id number], [id number], [id number], 25OHVITD1, 25OHVITD2, 25OHVITD3, TESTOFREE, TESTOSTERONE  ?Inflammation (CRP: Acute Phase) (ESR: Chronic Phase) ?No results found for: CRP, ESRSEDRATE, LATICACIDVEN    ?  ? ?Note: Above Lab results reviewed. ? ?Recent Imaging Review  ?CT CHEST LUNG CA SCREEN LOW DOSE W/O CM ?CLINICAL DATA:  Thirty-seven pack-year smoking history. Current ?smoker. ? ?EXAM: ?CT CHEST WITHOUT CONTRAST LOW-DOSE FOR LUNG CANCER SCREENING ? ?TECHNIQUE: ?Multidetector CT imaging of the chest was performed following the ?standard protocol  without IV contrast. ? ?RADIATION DOSE REDUCTION: This exam was performed according to the ?departmental dose-optimization program which includes automated ?exposure control, adjustment of the mA and/or kV according to

## 2022-03-19 NOTE — Progress Notes (Signed)
Nursing Pain Medication Assessment:  ?Safety precautions to be maintained throughout the outpatient stay will include: orient to surroundings, keep bed in low position, maintain call bell within reach at all times, provide assistance with transfer out of bed and ambulation.  ?Medication Inspection Compliance: Pill count conducted under aseptic conditions, in front of the patient. Neither the pills nor the bottle was removed from the patient's sight at any time. Once count was completed pills were immediately returned to the patient in their original bottle. ? ?Medication: See above ?Pill/Patch Count:  0 of 90 pills remain ?Pill/Patch Appearance: Markings consistent with prescribed medication ?Bottle Appearance: Standard pharmacy container. Clearly labeled. ?Filled Date: 2 / 78 / 2023 ?Last Medication intake:  Today ?

## 2022-04-04 ENCOUNTER — Encounter: Payer: Self-pay | Admitting: Student in an Organized Health Care Education/Training Program

## 2022-04-04 ENCOUNTER — Other Ambulatory Visit: Payer: Self-pay

## 2022-04-04 ENCOUNTER — Ambulatory Visit
Payer: BC Managed Care – PPO | Attending: Student in an Organized Health Care Education/Training Program | Admitting: Student in an Organized Health Care Education/Training Program

## 2022-04-04 VITALS — BP 104/72 | HR 91 | Temp 97.1°F | Resp 16 | Ht 67.0 in | Wt 162.0 lb

## 2022-04-04 DIAGNOSIS — M1712 Unilateral primary osteoarthritis, left knee: Secondary | ICD-10-CM | POA: Diagnosis not present

## 2022-04-04 MED ORDER — HYALURONAN 88 MG/4ML IX SOSY
4.0000 mL | PREFILLED_SYRINGE | Freq: Once | INTRA_ARTICULAR | Status: AC
Start: 2022-04-04 — End: 2022-04-04
  Administered 2022-04-04: 88 mg via INTRA_ARTICULAR

## 2022-04-04 MED ORDER — LIDOCAINE HCL 2 % IJ SOLN
20.0000 mL | Freq: Once | INTRAMUSCULAR | Status: AC
  Administered 2022-04-04: 400 mg
  Filled 2022-04-04: qty 20

## 2022-04-04 NOTE — Progress Notes (Signed)
PROVIDER NOTE: Interpretation of information contained herein should be left to medically-trained personnel. Specific patient instructions are provided elsewhere under "Patient Instructions" section of medical record. This document was created in part using STT-dictation technology, any transcriptional errors that may result from this process are unintentional.  ?Patient: Vanessa Greer ?Type: Established ?DOB: 1961-04-11 ?MRN: [id number] ?PCP: Lynnea Ferrier, MD  Service: Procedure ?DOS: 04/04/2022 ?Setting: Ambulatory ?Location: Ambulatory outpatient facility ?Delivery: Face-to-face Provider: Edward Jolly, MD ?Specialty: Interventional Pain Management ?Specialty designation: 09 ?Location: Outpatient facility ?Ref. Prov.: Lynnea Ferrier, MD   ? ?Primary Reason for Visit: Interventional Pain Management Treatment. ?CC: Knee Pain (Left ) ?  ?Procedure:          ? Type: Monovisc Intra-articular Knee Injection ?Laterality: Left (-LT) ?No.: 1  Series: n/a ?Level/approach: Medial ?Imaging guidance: None required (CPT-20610) ?Anesthesia: Local anesthesia (1-2% Lidocaine) ?Anxiolysis: None                 ?Sedation: None. ? ?Purpose: Diagnostic/Therapeutic ?Indications: Knee arthralgia associated to osteoarthritis of the knee ?1. Arthritis of left knee   ? ?NAS-11 score:  ? Pre-procedure: 4 /10  ? Post-procedure: 4 /10  ?  ? ?Pre-Procedure Preparation  ?Monitoring: As per clinic protocol.  ?Risk Assessment: ?Vitals:  [id number] body mass index is 25.37 kg/m? as calculated from the following: ?  Height as of this encounter: 5\' 7"  (1.702 m). ?  Weight as of this encounter: 162 lb (73.5 kg)., Rate:91 , BP:104/72, Resp:16, Temp:(!) 97.1 ?F (36.2 ?C), SpO2:100 %  ?Allergies: She is allergic to bupropion.  ?Precautions: No additional precautions required  ?Blood-thinner(s): None at this time  ?Coagulopathies: Reviewed. None identified.   ?Active Infection(s): Reviewed. None identified. Ms. Toole is afebrile  ? ?Location  setting: Exam room ?Position: Sitting w/ knee bent 90 degrees ?Safety Precautions: Patient was assessed for positional comfort and pressure points before starting the procedure. ?Prepping solution: DuraPrep (Iodine Povacrylex [0.7% available iodine] and Isopropyl Alcohol, 74% w/w) ?Prep Area: Entire knee region ?Approach: percutaneous, just above the tibial plateau, lateral to the infrapatellar tendon. ?Intended target: Intra-articular knee space ?Materials: ?Tray: Block ?Needle(s): Regular ?Qty: 1/side ?Length: 1.5-inch ?Gauge: 25G  ? ?Meds ordered this encounter  ?Medications  ? Hyaluronan SOSY 88 mg  ? lidocaine (XYLOCAINE) 2 % (with pres) injection 400 mg  ?  No orders of the defined types were placed in this encounter. ?  ? ?Time-out: 1445 I initiated and conducted the "Time-out" before starting the procedure, as per protocol. The patient was asked to participate by confirming the accuracy of the "Time Out" information. Verification of the correct person, site, and procedure were performed and confirmed by me, the nursing staff, and the patient. "Time-out" conducted as per Joint Commission's Universal Protocol (UP.01.01.01). ?Procedure checklist: Completed  ? ?H&P (Pre-op  Assessment)  ?Ms. Burkholder is a 61 y.o. (year old), female patient, seen today for interventional treatment. She  has a past surgical history that includes Abdominal hysterectomy; Foot surgery; Ectopic pregnancy surgery; Colonoscopy; Laser ablation condolamata (N/A, 12/13/2017); and Esophagogastroduodenoscopy (egd) with propofol (N/A, 03/17/2019). Ms. Broxton has a current medication list which includes the following prescription(s): cyclobenzaprine, furosemide, furosemide, hydrocodone-acetaminophen, [START ON 04/18/2022] hydrocodone-acetaminophen, [START ON 05/18/2022] hydrocodone-acetaminophen, lyrica, multivitamin with minerals, pantoprazole, sertraline, simvastatin, and trazodone. Her primarily concern today is the Knee Pain (Left ) ? She is  allergic to bupropion.  ? ?Last encounter: My last encounter with her was on 03/19/2022. ?Pertinent problems: Ms. Hausler has Chronic bilateral low  back pain with bilateral sciatica; Lumbar facet arthropathy; Lumbar spondylosis; Lumbar degenerative disc disease; Chronic pain syndrome; and Coccyodynia on their pertinent problem list. ?Pain Assessment: Severity of Chronic pain is reported as a 4 /10. Location: Knee Left/sometimes when hearting really severely it will radiate into calf and shin. Onset: More than a month ago. Quality: Discomfort, Constant, Aching, Sharp. Timing: Constant. Modifying factor(s): walking helps, i.e. when she first gets up and moving around.Marland Kitchen ?Vitals:  height is 5\' 7"  (1.702 m) and weight is 162 lb (73.5 kg). Her temporal temperature is 97.1 ?F (36.2 ?C) (abnormal). Her blood pressure is 104/72 and her pulse is 91. Her respiration is 16 and oxygen saturation is 100%.  ? ?Reason for encounter: "interventional pain management therapy due pain of at least four (4) weeks in duration, with failure to respond and/or inability to tolerate more conservative care.  ? ?Site Confirmation: Ms. Kilbride was asked to confirm the procedure and laterality before marking the site. ? ?Consent: Before the procedure and under the influence of no sedative(s), amnesic(s), or anxiolytics, the patient was informed of the treatment options, risks and possible complications. To fulfill our ethical and legal obligations, as recommended by the American Medical Association's Code of Ethics, I have informed the patient of my clinical impression; the nature and purpose of the treatment or procedure; the risks, benefits, and possible complications of the intervention; the alternatives, including doing nothing; the risk(s) and benefit(s) of the alternative treatment(s) or procedure(s); and the risk(s) and benefit(s) of doing nothing. ?The patient was provided information about the general risks and possible complications  associated with the procedure. These may include, but are not limited to: failure to achieve desired goals, infection, bleeding, organ or nerve damage, allergic reactions, paralysis, and death. ?In addition, the patient was informed of those risks and complications associated to Spine-related procedures, such as failure to decrease pain; infection (i.e.: Meningitis, epidural or intraspinal abscess); bleeding (i.e.: epidural hematoma, subarachnoid hemorrhage, or any other type of intraspinal or peri-dural bleeding); organ or nerve damage (i.e.: Any type of peripheral nerve, nerve root, or spinal cord injury) with subsequent damage to sensory, motor, and/or autonomic systems, resulting in permanent pain, numbness, and/or weakness of one or several areas of the body; allergic reactions; (i.e.: anaphylactic reaction); and/or death. ?Furthermore, the patient was informed of those risks and complications associated with the medications. These include, but are not limited to: allergic reactions (i.e.: anaphylactic or anaphylactoid reaction(s)); adrenal axis suppression; blood sugar elevation that in diabetics may result in ketoacidosis or comma; water retention that in patients with history of congestive heart failure may result in shortness of breath, pulmonary edema, and decompensation with resultant heart failure; weight gain; swelling or edema; medication-induced neural toxicity; particulate matter embolism and blood vessel occlusion with resultant organ, and/or nervous system infarction; and/or aseptic necrosis of one or more joints. ?Finally, the patient was informed that Medicine is not an exact science; therefore, there is also the possibility of unforeseen or unpredictable risks and/or possible complications that may result in a catastrophic outcome. The patient indicated having understood very clearly. We have given the patient no guarantees and we have made no promises. Enough time was given to the patient to  ask questions, all of which were answered to the patient's satisfaction. Ms. Vinje has indicated that she wanted to continue with the procedure. ?Attestation: I, the ordering provider, attest that I have di

## 2022-04-04 NOTE — Progress Notes (Signed)
Safety precautions to be maintained throughout the outpatient stay will include: orient to surroundings, keep bed in low position, maintain call bell within reach at all times, provide assistance with transfer out of bed and ambulation.  

## 2022-04-04 NOTE — Patient Instructions (Signed)

## 2022-04-05 ENCOUNTER — Telehealth: Payer: Self-pay

## 2022-04-05 ENCOUNTER — Telehealth: Payer: Self-pay | Admitting: Student in an Organized Health Care Education/Training Program

## 2022-04-05 NOTE — Telephone Encounter (Signed)
Patient had in room knee injection yesterday and is calling today stating she is having difficulty bearing weight. She states that its swollen and is bruised. I told her to except some pain after injection and that she could use heat today. I did tell her that it could take a few days for the medication to help her knee., and if needed she could always go to the ED . Also instructed on s/s of pain in calve or any heat/pain behind knee,etc. Do you have any more suggestions? I will be glad to call her back. ?

## 2022-04-05 NOTE — Telephone Encounter (Signed)
Pt was called and message left on answering service to call back if she need [place]. ?

## 2022-06-05 ENCOUNTER — Encounter: Payer: Self-pay | Admitting: Student in an Organized Health Care Education/Training Program

## 2022-06-05 ENCOUNTER — Ambulatory Visit
Payer: BC Managed Care – PPO | Attending: Student in an Organized Health Care Education/Training Program | Admitting: Student in an Organized Health Care Education/Training Program

## 2022-06-05 VITALS — BP 132/69 | HR 62 | Temp 97.5°F | Ht 67.0 in | Wt 162.0 lb

## 2022-06-05 DIAGNOSIS — M65341 Trigger finger, right ring finger: Secondary | ICD-10-CM

## 2022-06-05 DIAGNOSIS — M533 Sacrococcygeal disorders, not elsewhere classified: Secondary | ICD-10-CM | POA: Diagnosis not present

## 2022-06-05 DIAGNOSIS — M5416 Radiculopathy, lumbar region: Secondary | ICD-10-CM

## 2022-06-05 DIAGNOSIS — M5136 Other intervertebral disc degeneration, lumbar region: Secondary | ICD-10-CM | POA: Diagnosis present

## 2022-06-05 DIAGNOSIS — M47816 Spondylosis without myelopathy or radiculopathy, lumbar region: Secondary | ICD-10-CM | POA: Diagnosis not present

## 2022-06-05 DIAGNOSIS — G894 Chronic pain syndrome: Secondary | ICD-10-CM | POA: Diagnosis present

## 2022-06-05 DIAGNOSIS — M1712 Unilateral primary osteoarthritis, left knee: Secondary | ICD-10-CM | POA: Diagnosis present

## 2022-06-05 MED ORDER — HYDROCODONE-ACETAMINOPHEN 10-325 MG PO TABS: 1.0000 | ORAL_TABLET | Freq: Three times a day (TID) | ORAL | 0 refills | Status: DC | PRN

## 2022-06-05 NOTE — Progress Notes (Signed)
PROVIDER NOTE: Information contained herein reflects review and annotations entered in association with encounter. Interpretation of such information and data should be left to medically-trained personnel. Information provided to patient can be located elsewhere in the medical record under "Patient Instructions". Document created using STT-dictation technology, any transcriptional errors that may result from process are unintentional.    Patient: Vanessa Greer  Service Category: E/M  Provider: Gillis Santa, MD  DOB: May 23, 1961  DOS: 06/05/2022  Specialty: Interventional Pain Management  MRN: [id number]  Setting: Ambulatory outpatient  PCP: Adin Hector, MD  Type: Established Patient    Referring Provider: Adin Hector, MD  Location: Office  Delivery: Face-to-face     HPI  Vanessa Greer, a 61 y.o. year old female, is here today because of her Lumbar spondylosis [M47.816]. Ms. Hakimian primary complain today is Back Pain (lower)  Last encounter: My last encounter with her was on 04/04/2022 Pertinent problems: Ms. Joaquin has Chronic bilateral low back pain with bilateral sciatica; Lumbar facet arthropathy; Lumbar spondylosis; Lumbar degenerative disc disease; Chronic pain syndrome; and Coccyodynia on their pertinent problem list. Pain Assessment: Severity of Chronic pain is reported as a 3 /10. Location: Back Right, Left, Lower/Denies. Onset: More than a month ago. Quality: Aching, Throbbing, Constant, Nagging. Timing: Constant. Modifying factor(s): Meds. Vitals:  height is '5\' 7"'  (1.702 m) and weight is 162 lb (73.5 kg). Her temperature is 97.5 F (36.4 C) (abnormal). Her blood pressure is 132/69 and her pulse is 62. Her oxygen saturation is 98%.   Reason for encounter: both, medication management and post-procedure assessment.    Silva follows up today for medication management as well as postprocedural evaluation.  She is status post left knee Monovisc injection 04/04/2022 that was  helpful for her left knee pain.  I informed her that we can repeat this in the future should her left knee pain returns and becomes bothersome for her.  She continues to have low back pain related to lumbar facet arthropathy and lumbar spondylosis.  She is aware that we can repeat lumbar RFA that has been helpful for her in the past.  She is also complaining of a right ring trigger finger that is making it troublesome for her to perform ADLs.  She has tried heat, massage.  We discussed doing a trigger finger injection.  Otherwise refill hydrocodone as below, no change in dose.  UDS up-to-date however will renew at next visit.  Pharmacotherapy Assessment  Analgesic: Norco 10 mg TID prn   Monitoring: [place] PMP: PDMP reviewed during this encounter.       Pharmacotherapy: No side-effects or adverse reactions reported. Compliance: No problems identified. Effectiveness: Clinically acceptable.  [name], RN  06/05/2022  8:14 AM  Sign when Signing Visit Nursing Pain Medication Assessment:  Safety precautions to be maintained throughout the outpatient stay will include: orient to surroundings, keep bed in low position, maintain call bell within reach at all times, provide assistance with transfer out of bed and ambulation.  Medication Inspection Compliance: Pill count conducted under aseptic conditions, in front of the patient. Neither the pills nor the bottle was removed from the patient's sight at any time. Once count was completed pills were immediately returned to the patient in their original bottle.  Medication: Hydrocodone/APAP Pill/Patch Count:  48 of 90 pills remain Pill/Patch Appearance: Markings consistent with prescribed medication Bottle Appearance: Standard pharmacy container. Clearly labeled. Filled Date: 5 / 20 / 2023 Last Medication intake:  TodaySafety  precautions to be maintained throughout the outpatient stay will include: orient to surroundings, keep bed in low position,  maintain call bell within reach at all times, provide assistance with transfer out of bed and ambulation.     UDS:  Summary  Date Value Ref Range Status  08/23/2021 Note  Final    Comment:    ==================================================================== [facility] (MW) ==================================================================== Test                             Result       Flag       Units  Drug Present and Declared for Prescription Verification   Hydrocodone                    1603         EXPECTED   ng/mg creat   Hydromorphone                  297          EXPECTED   ng/mg creat   Dihydrocodeine                 176          EXPECTED   ng/mg creat   Norhydrocodone                 5235         EXPECTED   ng/mg creat    Sources of hydrocodone include scheduled prescription medications.    Hydromorphone, dihydrocodeine and norhydrocodone are expected    metabolites of hydrocodone. Hydromorphone and dihydrocodeine are    also available as scheduled prescription medications.  ==================================================================== Test                      Result    Flag   Units      Ref Range   Creatinine              34               mg/dL      >=20 ==================================================================== Declared Medications:  The flagging and interpretation on this report are based on the  following declared medications.  Unexpected results may arise from  inaccuracies in the declared medications.   **Note: The testing scope of this panel includes these medications:   Hydrocodone (Norco)   **Note: The testing scope of this panel does not include the  following reported medications:   Acetaminophen (Norco)  Aspirin  Cholecalciferol  Cyclobenzaprine (Flexeril)  Furosemide (Lasix)  Multivitamin  Pantoprazole (Protonix)  Pregabalin (Lyrica)  Sertraline (Zoloft)  Trazodone  (Desyrel) ==================================================================== For clinical consultation, please call [phone]. ====================================================================      ROS  Constitutional: Denies any fever or chills Gastrointestinal: No reported hemesis, hematochezia, vomiting, or acute GI distress Musculoskeletal:  Right trigger finger Neurological: No reported episodes of acute onset apraxia, aphasia, dysarthria, agnosia, amnesia, paralysis, loss of coordination, or loss of consciousness  Medication Review  HYDROcodone-acetaminophen, cyclobenzaprine, furosemide, multivitamin with minerals, pantoprazole, pregabalin, sertraline, simvastatin, and traZODone  History Review  Allergy: Ms. Pizana is allergic to bupropion. Drug: Ms. Navarrete  reports no history of drug use. Alcohol:  reports current alcohol use. Tobacco:  reports that she has been smoking cigarettes. She has a 20.50 pack-year smoking history. She has never used smokeless tobacco. Social: Ms. Midgley  reports that she has been smoking cigarettes. She has  a 20.50 pack-year smoking history. She has never used smokeless tobacco. She reports current alcohol use. She reports that she does not use drugs. Medical:  has a past medical history of Anxiety, Back pain, DDD (degenerative disc disease), lumbar, Depression, GERD (gastroesophageal reflux disease), High cholesterol, Lumbar radiculitis, Pneumothorax (1982), and Sleep apnea. Surgical: Ms. Beauchamp  has a past surgical history that includes Abdominal hysterectomy; Foot surgery; Ectopic pregnancy surgery; Colonoscopy; Laser ablation condolamata (N/A, 12/13/2017); and Esophagogastroduodenoscopy (egd) with propofol (N/A, 03/17/2019). Family: family history includes Breast cancer (age of onset: 29) in her mother.  Laboratory Chemistry Profile   Renal No results found for: BUN, CREATININE, LABCREA, BCR, GFR, GFRAA, GFRNONAA, LABVMA, EPIRU,  EPINEPH24HUR, NOREPRU, NOREPI24HUR, DOPARU, [id number]  Hepatic No results found for: AST, ALT, ALBUMIN, ALKPHOS, HCVAB, AMYLASE, LIPASE, AMMONIA  Electrolytes No results found for: NA, K, CL, CALCIUM, MG, PHOS  Bone No results found for: VD25OH, [id number], FI4332RJ1, OA4166AY3, 25OHVITD1, 25OHVITD2, 25OHVITD3, TESTOFREE, TESTOSTERONE  Inflammation (CRP: Acute Phase) (ESR: Chronic Phase) No results found for: CRP, ESRSEDRATE, LATICACIDVEN       Note: Above Lab results reviewed.  Recent Imaging Review  CT CHEST LUNG CA SCREEN LOW DOSE W/O CM CLINICAL DATA:  Thirty-seven pack-year smoking history. Current smoker.  EXAM: CT CHEST WITHOUT CONTRAST LOW-DOSE FOR LUNG CANCER SCREENING  TECHNIQUE: Multidetector CT imaging of the chest was performed following the standard protocol without IV contrast.  RADIATION DOSE REDUCTION: This exam was performed according to the departmental dose-optimization program which includes automated exposure control, adjustment of the mA and/or kV according to patient size and/or use of iterative reconstruction technique.  COMPARISON:  09/29/2020  FINDINGS: Cardiovascular: Aortic atherosclerosis. Mild cardiomegaly with trace, likely physiologic pericardial thickening or fluid.  Mediastinum/Nodes: No mediastinal or definite hilar adenopathy, given limitations of unenhanced CT.  Lungs/Pleura: No pleural fluid. Mild centrilobular and paraseptal emphysema. Pulmonary nodules of maximally volume derived equivalent diameter 4.6 mm. Similar. There is also a stable non solid pulmonary nodule of volume derived equivalent diameter 7.7 mm in the posterior left upper lobe.  Upper Abdomen: Variant lateral segment left liver lobe extending in the left upper quadrant. Normal imaged portions of the spleen, stomach, pancreas, gallbladder, left adrenal gland, kidneys. Low-density right adrenal 1.9 cm nodule is similar and likely an adenoma.  Musculoskeletal: No  acute osseous abnormality.  IMPRESSION: 1. Lung-RADS 2, benign appearance or behavior. Continue annual screening with low-dose chest CT without contrast in 12 months. 2. Right adrenal adenoma. 3. Aortic Atherosclerosis (ICD10-I70.0) and Emphysema (ICD10-J43.9).  Electronically Signed   By: Abigail Miyamoto M.D.   On: 03/01/2022 17:35  Note: Reviewed        Physical Exam  General appearance: Well nourished, well developed, and well hydrated. In no apparent acute distress Mental status: Alert, oriented x 3 (person, place, & time)       Respiratory: No evidence of acute respiratory distress Eyes: PERLA Vitals: BP 132/69   Pulse 62   Temp (!) 97.5 F (36.4 C)   Ht '5\' 7"'  (1.702 m)   Wt 162 lb (73.5 kg)   SpO2 98%   BMI 25.37 kg/m  BMI: Estimated body mass index is 25.37 kg/m as calculated from the following:   Height as of this encounter: '5\' 7"'  (1.702 m).   Weight as of this encounter: 162 lb (73.5 kg). Ideal: Ideal body weight: 61.6 kg (135 lb 12.9 oz) Adjusted ideal body weight: 66.4 kg (146 lb 4.5 oz)  Right trigger finger, ring finger  +LBP,  pain with facet loading  Assessment   Status Diagnosis  Controlled Controlled Controlled 1. Lumbar spondylosis   2. Lumbar degenerative disc disease   3. Sacroiliac joint pain   4. Lumbar radiculopathy   5. Arthritis of left knee   6. Trigger finger, right ring finger   7. Chronic pain syndrome        Plan of Care   Ms. LASANDRA BATLEY has a current medication list which includes the following long-term medication(s): furosemide, lyrica, sertraline, simvastatin, trazodone, furosemide, [START ON 06/19/2022] hydrocodone-acetaminophen, [START ON 07/19/2022] hydrocodone-acetaminophen, and [START ON 08/18/2022] hydrocodone-acetaminophen.  Pharmacotherapy (Medications Ordered): Meds ordered this encounter  Medications   HYDROcodone-acetaminophen (NORCO) 10-325 MG tablet    Sig: Take 1 tablet by mouth every 8 (eight) hours as  needed for severe pain. Must last 30 days. For chronic pain.    Dispense:  90 tablet    Refill:  0    [place] STOP ACT - Not applicable. Fill one day early if pharmacy is closed on scheduled refill date.   HYDROcodone-acetaminophen (NORCO) 10-325 MG tablet    Sig: Take 1 tablet by mouth every 8 (eight) hours as needed for severe pain. Must last 30 days. For chronic pain.    Dispense:  90 tablet    Refill:  0    [place] STOP ACT - Not applicable. Fill one day early if pharmacy is closed on scheduled refill date.   HYDROcodone-acetaminophen (NORCO) 10-325 MG tablet    Sig: Take 1 tablet by mouth every 8 (eight) hours as needed for severe pain. Must last 30 days. For chronic pain.    Dispense:  90 tablet    Refill:  0    [place] STOP ACT - Not applicable. Fill one day early if pharmacy is closed on scheduled refill date.   Orders Placed This Encounter  Procedures   Injection tendon or ligament    Standing Status:   Future    Standing Expiration Date:   09/05/2022    Scheduling Instructions:     Right trigger finger injection (RING FINGER)     Follow-up plan:   Return in about 13 days (around 06/18/2022) for right ring trigger finger injection, in clinic NS.     status post left L3, L4, L5, S1 RFA on 07/06/2019 and right L3, L4, L5, S1 RFA on 07/22/2019-       Recent Visits Date Type Provider Dept  04/04/22 Procedure visit Gillis Santa, MD [facility]  03/19/22 Office Visit Gillis Santa, MD [facility]  Showing recent visits within past 90 days and meeting all other requirements Today's Visits Date Type Provider Dept  06/05/22 Office Visit Gillis Santa, MD [facility]  Showing today's visits and meeting all other requirements Future Appointments No visits were found meeting these conditions. Showing future appointments within next 90 days and meeting all other requirements  I discussed the assessment and treatment plan with the patient. The patient was provided an  opportunity to ask questions and all were answered. The patient agreed with the plan and demonstrated an understanding of the instructions.  Patient advised to call back or seek an in-person evaluation if the symptoms or condition worsens.  Duration of encounter: 67mnutes.  Note by: BGillis Santa MD Date: 06/05/2022; Time: 8:57 AM

## 2022-06-05 NOTE — Progress Notes (Signed)
Nursing Pain Medication Assessment:  Safety precautions to be maintained throughout the outpatient stay will include: orient to surroundings, keep bed in low position, maintain call bell within reach at all times, provide assistance with transfer out of bed and ambulation.  Medication Inspection Compliance: Pill count conducted under aseptic conditions, in front of the patient. Neither the pills nor the bottle was removed from the patient's sight at any time. Once count was completed pills were immediately returned to the patient in their original bottle.  Medication: Hydrocodone/APAP Pill/Patch Count:  48 of 90 pills remain Pill/Patch Appearance: Markings consistent with prescribed medication Bottle Appearance: Standard pharmacy container. Clearly labeled. Filled Date: 5 / 20 / 2023 Last Medication intake:  TodaySafety precautions to be maintained throughout the outpatient stay will include: orient to surroundings, keep bed in low position, maintain call bell within reach at all times, provide assistance with transfer out of bed and ambulation.

## 2022-07-18 ENCOUNTER — Ambulatory Visit
Payer: BC Managed Care – PPO | Attending: Student in an Organized Health Care Education/Training Program | Admitting: Student in an Organized Health Care Education/Training Program

## 2022-07-18 ENCOUNTER — Encounter: Payer: Self-pay | Admitting: Student in an Organized Health Care Education/Training Program

## 2022-07-18 VITALS — BP 128/76 | HR 75 | Temp 97.4°F | Resp 16 | Ht 66.0 in | Wt 162.0 lb

## 2022-07-18 DIAGNOSIS — M65341 Trigger finger, right ring finger: Secondary | ICD-10-CM | POA: Insufficient documentation

## 2022-07-18 MED ORDER — LIDOCAINE HCL URETHRAL/MUCOSAL 2 % EX GEL
CUTANEOUS | Status: AC
  Filled 2022-07-18: qty 6

## 2022-07-18 MED ORDER — DEXAMETHASONE SODIUM PHOSPHATE 10 MG/ML IJ SOLN
10.0000 mg | Freq: Once | INTRAMUSCULAR | Status: AC
  Administered 2022-07-18: 10 mg

## 2022-07-18 MED ORDER — DEXAMETHASONE SODIUM PHOSPHATE 10 MG/ML IJ SOLN
INTRAMUSCULAR | Status: AC
  Filled 2022-07-18: qty 1

## 2022-07-18 MED ORDER — LIDOCAINE HCL 2 % IJ SOLN
1.0000 mL | Freq: Once | INTRAMUSCULAR | Status: AC
  Administered 2022-07-18: 20 mg

## 2022-07-18 MED ORDER — LIDOCAINE HCL (PF) 2 % IJ SOLN
INTRAMUSCULAR | Status: AC
  Filled 2022-07-18: qty 5

## 2022-07-18 NOTE — Progress Notes (Signed)
PROVIDER NOTE: Interpretation of information contained herein should be left to medically-trained personnel. Specific patient instructions are provided elsewhere under "Patient Instructions" section of medical record. This document was created in part using STT-dictation technology, any transcriptional errors that may result from this process are unintentional.  Patient: Vanessa Greer Type: Established DOB: [birth year] MRN: [id number] PCP: Adin Hector, MD  Service: Procedure DOS: 07/18/2022 Setting: Ambulatory Location: Ambulatory outpatient facility Delivery: Face-to-face Provider: Gillis Santa, MD Specialty: Interventional Pain Management Specialty designation: 09 Location: Outpatient facility Ref. Prov.: Adin Hector, MD    Primary Reason for Visit: Interventional Pain Management Treatment. CC: Hand Pain (Right ring finger)    Procedure:          Anesthesia, Analgesia, Anxiolysis:  Type: Trigger Finger Ligament/Tendon sheath (20550) Injection.  #1  Purpose: Diagnostic/Therapeutric Target Area: Flexor Digitorum Tendon sheath nodule Region: A-2 pulley of the metacarpal area Approach: Percutaneous Digit: No:4(Ring) Finger Laterality: Right-hand  Type: Local Anesthesia Local Anesthetic: Lidocaine 1-2% Sedation: None  Indication(s):  Analgesia Route: Infiltration ([place]/IM) IV Access: N/A   Position: Sitting   1. Trigger finger, right ring finger    NAS-11 Pain score:   Pre-procedure: 4 /10   Post-procedure: 4 /10     Pre-op H&P Assessment:  Vanessa Greer is a 61 y.o. (year old), female patient, seen today for interventional treatment. She  has a past surgical history that includes Abdominal hysterectomy; Foot surgery; Ectopic pregnancy surgery; Colonoscopy; Laser ablation condolamata (N/A, 12/13/2017); and Esophagogastroduodenoscopy (egd) with propofol (N/A, 03/17/2019). Vanessa Greer has a current medication list which includes the following prescription(s):  cyclobenzaprine, furosemide, hydrocodone-acetaminophen, [START ON 07/19/2022] hydrocodone-acetaminophen, [START ON 08/18/2022] hydrocodone-acetaminophen, lyrica, multivitamin with minerals, pantoprazole, sertraline, simvastatin, trazodone, and furosemide. Her primarily concern today is the Hand Pain (Right ring finger)  Initial Vital Signs:  Pulse/HCG Rate: 75  Temp: (!) 97.4 F (36.3 C) Resp: 16 BP: 128/76 SpO2: 98 %  BMI: Estimated body mass index is 26.15 kg/m as calculated from the following:   Height as of this encounter: 5\' 6"  (1.676 m).   Weight as of this encounter: 162 lb (73.5 kg).  Risk Assessment: Allergies: Reviewed. She is allergic to bupropion.  Allergy Precautions: None required Coagulopathies: Reviewed. None identified.  Blood-thinner therapy: None at this time Active Infection(s): Reviewed. None identified. Vanessa Greer is afebrile  Site Confirmation: Vanessa Greer was asked to confirm the procedure and laterality before marking the site Procedure checklist: Completed Consent: Before the procedure and under the influence of no sedative(s), amnesic(s), or anxiolytics, the patient was informed of the treatment options, risks and possible complications. To fulfill our ethical and legal obligations, as recommended by the American Medical Association's Code of Ethics, I have informed the patient of my clinical impression; the nature and purpose of the treatment or procedure; the risks, benefits, and possible complications of the intervention; the alternatives, including doing nothing; the risk(s) and benefit(s) of the alternative treatment(s) or procedure(s); and the risk(s) and benefit(s) of doing nothing. The patient was provided information about the general risks and possible complications associated with the procedure. These may include, but are not limited to: failure to achieve desired goals, infection, bleeding, organ or nerve damage, allergic reactions, paralysis, and  death. In addition, the patient was informed of those risks and complications associated to the procedure, such as failure to decrease pain; infection; bleeding; organ or nerve damage with subsequent damage to sensory, motor, and/or autonomic systems, resulting in permanent pain, numbness, and/or weakness of  one or several areas of the body; allergic reactions; (i.e.: anaphylactic reaction); and/or death. Furthermore, the patient was informed of those risks and complications associated with the medications. These include, but are not limited to: allergic reactions (i.e.: anaphylactic or anaphylactoid reaction(s)); adrenal axis suppression; blood sugar elevation that in diabetics may result in ketoacidosis or comma; water retention that in patients with history of congestive heart failure may result in shortness of breath, pulmonary edema, and decompensation with resultant heart failure; weight gain; swelling or edema; medication-induced neural toxicity; particulate matter embolism and blood vessel occlusion with resultant organ, and/or nervous system infarction; and/or aseptic necrosis of one or more joints. Finally, the patient was informed that Medicine is not an exact science; therefore, there is also the possibility of unforeseen or unpredictable risks and/or possible complications that may result in a catastrophic outcome. The patient indicated having understood very clearly. We have given the patient no guarantees and we have made no promises. Enough time was given to the patient to ask questions, all of which were answered to the patient's satisfaction. Vanessa Greer has indicated that she wanted to continue with the procedure. Attestation: I, the ordering provider, attest that I have discussed with the patient the benefits, risks, side-effects, alternatives, likelihood of achieving goals, and potential problems during recovery for the procedure that I have provided informed consent. Date  Time: 07/18/2022   1:31 PM  Pre-Procedure Preparation:  Monitoring: As per clinic protocol. Respiration, ETCO2, SpO2, BP, heart rate and rhythm monitor placed and checked for adequate function Safety Precautions: Patient was assessed for positional comfort and pressure points before starting the procedure. Time-out: I initiated and conducted the "Time-out" before starting the procedure, as per protocol. The patient was asked to participate by confirming the accuracy of the "Time Out" information. Verification of the correct person, site, and procedure were performed and confirmed by me, the nursing staff, and the patient. "Time-out" conducted as per Joint Commission's Universal Protocol (UP.01.01.01). Time: 1355  Description of Procedure:          Area Prepped: Entire palmar and dorsal aspect of hand, up to forearm area. DuraPrep (Iodine Povacrylex [0.7% available iodine] and Isopropyl Alcohol, 74% w/w) Safety Precautions: Aspiration looking for blood return was conducted prior to all injections. At no point did we inject any substances, as a needle was being advanced. No attempts were made at seeking any paresthesias. Safe injection practices and needle disposal techniques used. Medications properly checked for expiration dates. SDV (single dose vial) medications used. Description of the Procedure: Protocol guidelines were followed. The patient was placed in position. The target area was identified and prepped in the usual manner. Skin & deeper tissues infiltrated with local anesthetic. Appropriate time provided for local anesthetics to take effect. The procedure needle was slowly advanced to target area. Proper needle placement secured. Negative aspiration confirmed. Solution injected in intermittent fashion, asking for systemic symptoms every 0.5cc. Needle(s) removed and area cleaned, making sure to leave some prepping solution back to take advantage of its long term bactericidal properties.  Vitals:   07/18/22 1334   BP: 128/76  Pulse: 75  Resp: 16  Temp: (!) 97.4 F (36.3 C)  SpO2: 98%  Weight: 162 lb (73.5 kg)  Height: 5\' 6"  (1.676 m)    Start Time: 1355 hrs. End Time: 1357 hrs. Materials:   2cc injected for the A2 pulley system    Imaging Guidance:          Type of Imaging Technique: None  used Indication(s): N/A Exposure Time: No patient exposure Contrast: None used. Fluoroscopic Guidance: N/A Ultrasound Guidance: N/A Interpretation: N/A  Post-operative Assessment:  Post-procedure Vital Signs:  Pulse/HCG Rate: 75  Temp:  (!) 97.4 F (36.3 C) Resp: 16 BP: 128/76 SpO2: 98 %  EBL: None  Complications: No immediate post-treatment complications observed by team, or reported by patient.  Note: The patient tolerated the entire procedure well. A repeat set of vitals were taken after the procedure and the patient was kept under observation following institutional policy, for this type of procedure. Post-procedural neurological assessment was performed, showing return to baseline, prior to discharge. The patient was provided with post-procedure discharge instructions, including a section on how to identify potential problems. Should any problems arise concerning this procedure, the patient was given instructions to immediately contact us, at any time, without hesitation. In any case, we plan to contact the patient by telephone for a follow-up status report regarding this interventional procedure.  Comments:  No additional relevant information.  Plan of Care  Orders:  No orders of the defined types were placed in this encounter.  Chronic Opioid Analgesic:  Norco 10 mg TID prn   Medications ordered for procedure: Meds ordered this encounter  Medications   dexamethasone (DECADRON) injection 10 mg   lidocaine (XYLOCAINE) 2 % (with pres) injection 20 mg   Medications administered: We administered dexamethasone and lidocaine.  See the medical record for exact dosing, route, and time  of administration.  Follow-up plan:   Return in about 4 weeks (around 08/15/2022), or PPE- virtual.       status post left L3, L4, L5, S1 RFA on 07/06/2019 and right L3, L4, L5, S1 RFA on 07/22/2019-        Recent Visits Date Type Provider Dept  06/05/22 Office Visit Edward Jolly, MD [facility]  Showing recent visits within past 90 days and meeting all other requirements Today's Visits Date Type Provider Dept  07/18/22 Procedure visit Edward Jolly, MD [facility]  Showing today's visits and meeting all other requirements Future Appointments Date Type Provider Dept  08/13/22 Appointment Edward Jolly, MD [facility]  09/11/22 Appointment Edward Jolly, MD [facility]  Showing future appointments within next 90 days and meeting all other requirements  Disposition: Discharge home  Discharge (Date  Time): 07/18/2022; 1410 hrs.   Primary Care Physician: Lynnea Ferrier, MD Location: [facility] Note by: Edward Jolly, MD Date: 07/18/2022; Time: 3:31 PM  Disclaimer:  Medicine is not an exact science. The only guarantee in medicine is that nothing is guaranteed. It is important to note that the decision to proceed with this intervention was based on the information collected from the patient. The Data and conclusions were drawn from the patient's questionnaire, the interview, and the physical examination. Because the information was provided in large part by the patient, it cannot be guaranteed that it has not been purposely or unconsciously manipulated. Every effort has been made to obtain as much relevant data as possible for this evaluation. It is important to note that the conclusions that lead to this procedure are derived in large part from the available data. Always take into account that the treatment will also be dependent on availability of resources and existing treatment guidelines, considered by other Pain  Management Practitioners as being common knowledge and practice, at the time of the intervention. For Medico-Legal purposes, it is also important to point out that variation in procedural  techniques and pharmacological choices are the acceptable norm. The indications, contraindications, technique, and results of the above procedure should only be interpreted and judged by a Board-Certified Interventional Pain Specialist with extensive familiarity and expertise in the same exact procedure and technique.

## 2022-07-18 NOTE — Patient Instructions (Signed)

## 2022-07-18 NOTE — Progress Notes (Signed)
Safety precautions to be maintained throughout the outpatient stay will include: orient to surroundings, keep bed in low position, maintain call bell within reach at all times, provide assistance with transfer out of bed and ambulation.  

## 2022-07-19 ENCOUNTER — Telehealth: Payer: Self-pay

## 2022-07-19 NOTE — Telephone Encounter (Signed)
Post procedure phone call.  Patient states she is doing well.  

## 2022-08-13 ENCOUNTER — Ambulatory Visit
Payer: BC Managed Care – PPO | Attending: Student in an Organized Health Care Education/Training Program | Admitting: Student in an Organized Health Care Education/Training Program

## 2022-08-13 DIAGNOSIS — G894 Chronic pain syndrome: Secondary | ICD-10-CM

## 2022-08-13 DIAGNOSIS — M65341 Trigger finger, right ring finger: Secondary | ICD-10-CM

## 2022-08-13 NOTE — Progress Notes (Signed)
Patient: Vanessa Greer  Service Category: E/M  Provider: Gillis Santa, MD  DOB: May 13, 1961  DOS: 08/13/2022  Location: Office  MRN: [id number]  Setting: Ambulatory outpatient  Referring Provider: Adin Hector, MD  Type: Established Patient  Specialty: Interventional Pain Management  PCP: Adin Hector, MD  Location: Remote location  Delivery: TeleHealth     Virtual Encounter - Pain Management PROVIDER NOTE: Information contained herein reflects review and annotations entered in association with encounter. Interpretation of such information and data should be left to medically-trained personnel. Information provided to patient can be located elsewhere in the medical record under "Patient Instructions". Document created using STT-dictation technology, any transcriptional errors that may result from process are unintentional.    Contact & Pharmacy Preferred: [phone] Home: [phone] (home) Mobile: [phone] (mobile) E-mail: [email or web address]  [facility], [place] - [address] [place] [address] Phone: [phone] Fax: [id number] [address], [place] - [address] [place] [address] Phone: [phone] Fax: [phone]   Pre-screening  [name] offered "in-person" vs "virtual" encounter. She indicated preferring virtual for this encounter.   Reason COVID-19*  Social distancing based on CDC and AMA recommendations.   I contacted [name] on 08/13/2022 via telephone.      I clearly identified myself as Gillis Santa, MD. I verified that I was speaking with the correct person using two identifiers (Name: MARKA TRELOAR, and date of birth: 01/03/1961).  Consent I sought verbal advanced consent from [name] for virtual visit interactions. I informed [name] of possible security and privacy concerns, risks, and  limitations associated with providing "not-in-person" medical evaluation and management services. I also informed [name] of the availability of "in-person" appointments. Finally, I informed her that there would be a charge for the virtual visit and that she could be  personally, fully or partially, financially responsible for it. Ms. Henriquez expressed understanding and agreed to proceed.   Historic Elements   [name] is a 61 y.o. year old, female patient evaluated today after our last contact on 07/18/2022. [name]  has a past medical history of Anxiety, Back pain, DDD (degenerative disc disease), lumbar, Depression, GERD (gastroesophageal reflux disease), High cholesterol, Lumbar radiculitis, Pneumothorax (1982), and Sleep apnea. She also  has a past surgical history that includes Abdominal hysterectomy; Foot surgery; Ectopic pregnancy surgery; Colonoscopy; Laser ablation condolamata (N/A, 12/13/2017); and Esophagogastroduodenoscopy (egd) with propofol (N/A, 03/17/2019). [name] has a current medication list which includes the following prescription(s): cyclobenzaprine, furosemide, hydrocodone-acetaminophen, hydrocodone-acetaminophen, [START ON 08/18/2022] hydrocodone-acetaminophen, lyrica, multivitamin with minerals, pantoprazole, sertraline, simvastatin, trazodone, and furosemide. She  reports that she has been smoking cigarettes. She has a 20.50 pack-year smoking history. She has never used smokeless tobacco. She reports current alcohol use. She reports that she does not use drugs. [name] is allergic to bupropion.   HPI  Today, she is being contacted for a post-procedure assessment.   Post-procedure evaluation    Procedure:          Anesthesia, Analgesia, Anxiolysis:  Type: Trigger Finger Ligament/Tendon sheath (20550) Injection.  #1  Purpose: Diagnostic/Therapeutric Target Area: Flexor Digitorum Tendon sheath nodule Region: A-2 pulley of the metacarpal area Approach:  Percutaneous Digit: No:4(Ring) Finger Laterality: Right-hand  Type: Local Anesthesia Local Anesthetic: Lidocaine 1-2% Sedation: None  Indication(s):  Analgesia Route: Infiltration ([place]/IM) IV Access: N/A   Position: Sitting   1. Trigger finger, right ring finger    NAS-11 Pain score:   Pre-procedure: 4 /10   Post-procedure: 4 /10      Effectiveness:  Initial hour after procedure: 100 %  Subsequent 4-6 hours post-procedure: 100 %  Analgesia past initial 6 hours: 0 %  Ongoing improvement:  Analgesic:  0%    Pharmacotherapy Assessment   Opioid Analgesic: Norco 10 mg TID prn   Monitoring: [place] PMP: PDMP reviewed during this encounter.       Pharmacotherapy: No side-effects or adverse reactions reported. Compliance: No problems identified. Effectiveness: Clinically acceptable. Plan: Refer to "POC". UDS:  Summary  Date Value Ref Range Status  08/23/2021 Note  Final    Comment:    ==================================================================== [employer] Select [facility] (MW) ==================================================================== Test                             Result       Flag       Units  Drug Present and Declared for Prescription Verification   Hydrocodone                    1603         EXPECTED   ng/mg creat   Hydromorphone                  297          EXPECTED   ng/mg creat   Dihydrocodeine                 176          EXPECTED   ng/mg creat   Norhydrocodone                 5235         EXPECTED   ng/mg creat    Sources of hydrocodone include scheduled prescription medications.    Hydromorphone, dihydrocodeine and norhydrocodone are expected    metabolites of hydrocodone. Hydromorphone and dihydrocodeine are    also available as scheduled prescription medications.  ==================================================================== Test                      Result    Flag   Units      Ref Range   Creatinine              34               mg/dL       >=20 ==================================================================== Declared Medications:  The flagging and interpretation on this report are based on the  following declared medications.  Unexpected results may arise from  inaccuracies in the declared medications.   **Note: The testing scope of this panel includes these medications:   Hydrocodone (Norco)   **Note: The testing scope of this panel does not include the  following reported medications:   Acetaminophen (Norco)  Aspirin  Cholecalciferol  Cyclobenzaprine (Flexeril)  Furosemide (Lasix)  Multivitamin  Pantoprazole (Protonix)  Pregabalin (Lyrica)  Sertraline (Zoloft)  Trazodone (Desyrel) ==================================================================== For clinical consultation, please call [phone]. ====================================================================    No results found for: "CBDTHCR", "D8THCCBX", "D9THCCBX"   Laboratory Chemistry Profile   Renal No results found for: "BUN", "CREATININE", "LABCREA", "BCR", "GFR", "GFRAA", "GFRNONAA", "LABVMA", "EPIRU", "EPINEPH24HUR", "NOREPRU", "NOREPI24HUR", "DOPARU", "DOPAM24HRUR"  Hepatic No results found for: "AST", "ALT", "ALBUMIN", "ALKPHOS", "HCVAB", "AMYLASE", "  LIPASE", "AMMONIA"  Electrolytes No results found for: "NA", "K", "CL", "CALCIUM", "MG", "PHOS"  Bone No results found for: "VD25OH", "VD125OH2TOT", "EE1007HQ1", "FX5883GP4", "25OHVITD1", "25OHVITD2", "25OHVITD3", "TESTOFREE", "TESTOSTERONE"  Inflammation (CRP: Acute Phase) (ESR: Chronic Phase) No results found for: "CRP", "ESRSEDRATE", "LATICACIDVEN"       Note: Above Lab results reviewed.  Imaging  CT CHEST LUNG CA SCREEN LOW DOSE W/O CM CLINICAL DATA:  Thirty-seven pack-year smoking history. Current smoker.  EXAM: CT CHEST WITHOUT CONTRAST LOW-DOSE FOR LUNG CANCER SCREENING  TECHNIQUE: Multidetector CT imaging of the chest was performed following the standard protocol  without IV contrast.  RADIATION DOSE REDUCTION: This exam was performed according to the departmental dose-optimization program which includes automated exposure control, adjustment of the mA and/or kV according to patient size and/or use of iterative reconstruction technique.  COMPARISON:  09/29/2020  FINDINGS: Cardiovascular: Aortic atherosclerosis. Mild cardiomegaly with trace, likely physiologic pericardial thickening or fluid.  Mediastinum/Nodes: No mediastinal or definite hilar adenopathy, given limitations of unenhanced CT.  Lungs/Pleura: No pleural fluid. Mild centrilobular and paraseptal emphysema. Pulmonary nodules of maximally volume derived equivalent diameter 4.6 mm. Similar. There is also a stable non solid pulmonary nodule of volume derived equivalent diameter 7.7 mm in the posterior left upper lobe.  Upper Abdomen: Variant lateral segment left liver lobe extending in the left upper quadrant. Normal imaged portions of the spleen, stomach, pancreas, gallbladder, left adrenal gland, kidneys. Low-density right adrenal 1.9 cm nodule is similar and likely an adenoma.  Musculoskeletal: No acute osseous abnormality.  IMPRESSION: 1. Lung-RADS 2, benign appearance or behavior. Continue annual screening with low-dose chest CT without contrast in 12 months. 2. Right adrenal adenoma. 3. Aortic Atherosclerosis (ICD10-I70.0) and Emphysema (ICD10-J43.9).  Electronically Signed   By: Abigail Miyamoto M.D.   On: 03/01/2022 17:35  Assessment  The primary encounter diagnosis was Trigger finger, right ring finger. A diagnosis of Chronic pain syndrome was also pertinent to this visit.  Plan of Care   Unfortunately no benefit with with trigger finger injection Recommend she follow up with orthopedics for trigger finger release She states that her PCP will place referral Follow up next month as scheduled for MM F2F    Return for Keep sch. appt.     status post left L3, L4,  L5, S1 RFA on 07/06/2019 and right L3, L4, L5, S1 RFA on 07/22/2019-       Recent Visits Date Type Provider Dept  07/18/22 Procedure visit Gillis Santa, MD [facility]  06/05/22 Office Visit Gillis Santa, MD [facility]  Showing recent visits within past 90 days and meeting all other requirements Today's Visits Date Type Provider Dept  08/13/22 Office Visit Gillis Santa, MD [facility]  Showing today's visits and meeting all other requirements Future Appointments Date Type Provider Dept  09/11/22 Appointment Gillis Santa, MD [facility]  Showing future appointments within next 90 days and meeting all other requirements  I discussed the assessment and treatment plan with the patient. The patient was provided an opportunity to ask questions and all were answered. The patient agreed with the plan and demonstrated an understanding of the instructions.  Patient advised to call back or seek an in-person evaluation if the symptoms or condition worsens.  Duration of encounter: 24mnutes.  Note by: BGillis Santa MD Date: 08/13/2022; Time: 2:44 PM

## 2022-09-11 ENCOUNTER — Encounter: Payer: BC Managed Care – PPO | Admitting: Student in an Organized Health Care Education/Training Program

## 2022-09-13 ENCOUNTER — Encounter: Payer: Self-pay | Admitting: Student in an Organized Health Care Education/Training Program

## 2022-09-13 ENCOUNTER — Ambulatory Visit
Payer: BC Managed Care – PPO | Attending: Student in an Organized Health Care Education/Training Program | Admitting: Student in an Organized Health Care Education/Training Program

## 2022-09-13 VITALS — BP 122/66 | HR 64 | Temp 97.3°F | Resp 16 | Ht 67.0 in | Wt 166.0 lb

## 2022-09-13 DIAGNOSIS — M533 Sacrococcygeal disorders, not elsewhere classified: Secondary | ICD-10-CM | POA: Diagnosis present

## 2022-09-13 DIAGNOSIS — M47816 Spondylosis without myelopathy or radiculopathy, lumbar region: Secondary | ICD-10-CM | POA: Diagnosis present

## 2022-09-13 DIAGNOSIS — M5136 Other intervertebral disc degeneration, lumbar region: Secondary | ICD-10-CM | POA: Diagnosis present

## 2022-09-13 DIAGNOSIS — G894 Chronic pain syndrome: Secondary | ICD-10-CM | POA: Diagnosis present

## 2022-09-13 DIAGNOSIS — M5416 Radiculopathy, lumbar region: Secondary | ICD-10-CM | POA: Diagnosis present

## 2022-09-13 MED ORDER — HYDROCODONE-ACETAMINOPHEN 10-325 MG PO TABS: 1.0000 | ORAL_TABLET | Freq: Three times a day (TID) | ORAL | 0 refills | Status: DC | PRN

## 2022-09-13 NOTE — Progress Notes (Signed)
PROVIDER NOTE: Information contained herein reflects review and annotations entered in association with encounter. Interpretation of such information and data should be left to medically-trained personnel. Information provided to patient can be located elsewhere in the medical record under "Patient Instructions". Document created using STT-dictation technology, any transcriptional errors that may result from process are unintentional.    Patient: Vanessa Greer  Service Category: E/M  Provider: Gillis Santa, MD  DOB: 04-12-1961  DOS: 09/13/2022  Specialty: Interventional Pain Management  MRN: [id number]  Setting: Ambulatory outpatient  PCP: Adin Hector, MD  Type: Established Patient    Referring Provider: Adin Hector, MD  Location: Office  Delivery: Face-to-face     HPI  Vanessa Greer, a 61 y.o. year old female, is here today because of Vanessa Greer Lumbar spondylosis [M47.816]. Vanessa Greer primary complain today is Back Pain  Last encounter: My last encounter with Vanessa Greer was on 06/05/22  Pertinent problems: Vanessa Greer has Chronic bilateral low back pain with bilateral sciatica; Lumbar facet arthropathy; Lumbar spondylosis; Lumbar degenerative disc disease; Chronic pain syndrome; and Coccyodynia on their pertinent problem list. Pain Assessment: Severity of Chronic pain is reported as a 0-No pain (previously took medication)/10. Location: Back Lower/right hip. Onset: More than a month ago. Quality: Aching, Discomfort, Nagging. Timing: Constant. Modifying factor(s): medication,heat, rest. Vitals:  height is 5' 7" (1.702 m) and weight is 166 lb (75.3 kg). Vanessa Greer temperature is 97.3 F (36.3 C) (abnormal). Vanessa Greer blood pressure is 122/66 and Vanessa Greer pulse is 64. Vanessa Greer respiration is 16 and oxygen saturation is 100%.   Reason for encounter: both, medication management and post-procedure assessment.    Vanessa Greer follows up today for medication management.  She is status post right ring finger trigger finger  release with Dr. Rudene Christians performed 09/10/2022.  She states that Vanessa Greer hand wrap was taken off about 2 days ago and that she has a small bandage on Vanessa Greer incision site.  She does have postoperative pain in the palmar aspect of Vanessa Greer hand as expected.  She is participating in hand flexion extension movements to help maintain range of motion.  Otherwise no significant changes in Vanessa Greer medical history.  Refill of hydrocodone as below.  We will also renew our annual urine toxicology screen for medication compliance monitoring.  Pharmacotherapy Assessment  Analgesic: Norco 10 mg TID prn   Monitoring: [place] PMP: PDMP reviewed during this encounter.       Pharmacotherapy: No side-effects or adverse reactions reported. Compliance: No problems identified. Effectiveness: Clinically acceptable.  Ignatius Specking, RN  09/13/2022  8:07 AM  Sign when Signing Visit Nursing Pain Medication Assessment:  Safety precautions to be maintained throughout the outpatient stay will include: orient to surroundings, keep bed in low position, maintain call bell within reach at all times, provide assistance with transfer out of bed and ambulation.  Medication Inspection Compliance: Pill count conducted under aseptic conditions, in front of the patient. Neither the pills nor the bottle was removed from the patient's sight at any time. Once count was completed pills were immediately returned to the patient in their original bottle.  Medication: See above Pill/Patch Count:  16 of 90 pills remain Pill/Patch Appearance: Markings consistent with prescribed medication Bottle Appearance: Standard pharmacy container. Clearly labeled. Filled Date: 8 / 20 / 2023 Last Medication intake:  Today  UDS:  Summary  Date Value Ref Range Status  08/23/2021 Note  Final    Comment:    ==================================================================== [employer] Select 13 (MW) ====================================================================  Test                              Result       Flag       Units  Drug Present and Declared for Prescription Verification   Hydrocodone                    1603         EXPECTED   ng/mg creat   Hydromorphone                  297          EXPECTED   ng/mg creat   Dihydrocodeine                 176          EXPECTED   ng/mg creat   Norhydrocodone                 5235         EXPECTED   ng/mg creat    Sources of hydrocodone include scheduled prescription medications.    Hydromorphone, dihydrocodeine and norhydrocodone are expected    metabolites of hydrocodone. Hydromorphone and dihydrocodeine are    also available as scheduled prescription medications.  ==================================================================== Test                      Result    Flag   Units      Ref Range   Creatinine              34               mg/dL      >=20 ==================================================================== Declared Medications:  The flagging and interpretation on this report are based on the  following declared medications.  Unexpected results may arise from  inaccuracies in the declared medications.   **Note: The testing scope of this panel includes these medications:   Hydrocodone (Norco)   **Note: The testing scope of this panel does not include the  following reported medications:   Acetaminophen (Norco)  Aspirin  Cholecalciferol  Cyclobenzaprine (Flexeril)  Furosemide (Lasix)  Multivitamin  Pantoprazole (Protonix)  Pregabalin (Lyrica)  Sertraline (Zoloft)  Trazodone (Desyrel) ==================================================================== For clinical consultation, please call [phone]. ====================================================================      ROS  Constitutional: Denies any fever or chills Gastrointestinal: No reported hemesis, hematochezia, vomiting, or acute GI distress Musculoskeletal:  Right trigger finger release, bandage on Neurological: No  reported episodes of acute onset apraxia, aphasia, dysarthria, agnosia, amnesia, paralysis, loss of coordination, or loss of consciousness  Medication Review  HYDROcodone-acetaminophen, cyclobenzaprine, furosemide, multivitamin with minerals, pantoprazole, pregabalin, sertraline, simvastatin, and traZODone  History Review  Allergy: Vanessa Greer is allergic to bupropion. Drug: Vanessa Greer  reports no history of drug use. Alcohol:  reports current alcohol use. Tobacco:  reports that she has been smoking cigarettes. She has a 20.50 pack-year smoking history. She has never used smokeless tobacco. Social: Vanessa Greer  reports that she has been smoking cigarettes. She has a 20.50 pack-year smoking history. She has never used smokeless tobacco. She reports current alcohol use. She reports that she does not use drugs. Medical:  has a past medical history of Anxiety, Back pain, DDD (degenerative disc disease), lumbar, Depression, GERD (gastroesophageal reflux disease), High cholesterol, Lumbar radiculitis, Pneumothorax (1982), and Sleep apnea. Surgical: Vanessa Greer  has a past surgical history  that includes Abdominal hysterectomy; Foot surgery; Ectopic pregnancy surgery; Colonoscopy; Laser ablation condolamata (N/A, 12/13/2017); Esophagogastroduodenoscopy (egd) with propofol (N/A, 03/17/2019); and Trigger finger release (Right). Family: family history includes Breast cancer (age of onset: 55) in Vanessa Greer mother.  Laboratory Chemistry Profile   Renal No results found for: "BUN", "CREATININE", "LABCREA", "BCR", "GFR", "GFRAA", "GFRNONAA", "LABVMA", "EPIRU", "EPINEPH24HUR", "NOREPRU", "NOREPI24HUR", "DOPARU", "DOPAM24HRUR"  Hepatic No results found for: "AST", "ALT", "ALBUMIN", "ALKPHOS", "HCVAB", "AMYLASE", "LIPASE", "AMMONIA"  Electrolytes No results found for: "NA", "K", "CL", "CALCIUM", "MG", "PHOS"  Bone No results found for: "VD25OH", "VD125OH2TOT", "VD3125OH2", "VD2125OH2", "25OHVITD1", "25OHVITD2",  "25OHVITD3", "TESTOFREE", "TESTOSTERONE"  Inflammation (CRP: Acute Phase) (ESR: Chronic Phase) No results found for: "CRP", "ESRSEDRATE", "LATICACIDVEN"       Note: Above Lab results reviewed.  Recent Imaging Review  CT CHEST LUNG CA SCREEN LOW DOSE W/O CM CLINICAL DATA:  Thirty-seven pack-year smoking history. Current smoker.  EXAM: CT CHEST WITHOUT CONTRAST LOW-DOSE FOR LUNG CANCER SCREENING  TECHNIQUE: Multidetector CT imaging of the chest was performed following the standard protocol without IV contrast.  RADIATION DOSE REDUCTION: This exam was performed according to the departmental dose-optimization program which includes automated exposure control, adjustment of the mA and/or kV according to patient size and/or use of iterative reconstruction technique.  COMPARISON:  09/29/2020  FINDINGS: Cardiovascular: Aortic atherosclerosis. Mild cardiomegaly with trace, likely physiologic pericardial thickening or fluid.  Mediastinum/Nodes: No mediastinal or definite hilar adenopathy, given limitations of unenhanced CT.  Lungs/Pleura: No pleural fluid. Mild centrilobular and paraseptal emphysema. Pulmonary nodules of maximally volume derived equivalent diameter 4.6 mm. Similar. There is also a stable non solid pulmonary nodule of volume derived equivalent diameter 7.7 mm in the posterior left upper lobe.  Upper Abdomen: Variant lateral segment left liver lobe extending in the left upper quadrant. Normal imaged portions of the spleen, stomach, pancreas, gallbladder, left adrenal gland, kidneys. Low-density right adrenal 1.9 cm nodule is similar and likely an adenoma.  Musculoskeletal: No acute osseous abnormality.  IMPRESSION: 1. Lung-RADS 2, benign appearance or behavior. Continue annual screening with low-dose chest CT without contrast in 12 months. 2. Right adrenal adenoma. 3. Aortic Atherosclerosis (ICD10-I70.0) and Emphysema (ICD10-J43.9).  Electronically Signed    By: Kyle  Talbot M.D.   On: 03/01/2022 17:35  Note: Reviewed        Physical Exam  General appearance: Well nourished, well developed, and well hydrated. In no apparent acute distress Mental status: Alert, oriented x 3 (person, place, & time)       Respiratory: No evidence of acute respiratory distress Eyes: PERLA Vitals: BP 122/66   Pulse 64   Temp (!) 97.3 F (36.3 C)   Resp 16   Ht 5' 7" (1.702 m)   Wt 166 lb (75.3 kg)   SpO2 100%   BMI 26.00 kg/m  BMI: Estimated body mass index is 26 kg/m as calculated from the following:   Height as of this encounter: 5' 7" (1.702 m).   Weight as of this encounter: 166 lb (75.3 kg). Ideal: Ideal body weight: 61.6 kg (135 lb 12.9 oz) Adjusted ideal body weight: 67.1 kg (147 lb 14.1 oz)  Right trigger finger, ring finger release with bandage in place  +LBP, pain with facet loading  Assessment   Status Diagnosis  Controlled Controlled Controlled 1. Lumbar spondylosis   2. Chronic pain syndrome   3. Lumbar degenerative disc disease   4. Sacroiliac joint pain   5. Lumbar radiculopathy        Plan of   Care   Vanessa Greer has a current medication list which includes the following long-term medication(s): furosemide, lyrica, sertraline, simvastatin, trazodone, hydrocodone-acetaminophen, hydrocodone-acetaminophen, [START ON 09/18/2022] hydrocodone-acetaminophen, [START ON 10/18/2022] hydrocodone-acetaminophen, and [START ON 11/17/2022] hydrocodone-acetaminophen.  Pharmacotherapy (Medications Ordered): Meds ordered this encounter  Medications   HYDROcodone-acetaminophen (NORCO) 10-325 MG tablet    Sig: Take 1 tablet by mouth every 8 (eight) hours as needed for severe pain. Must last 30 days. For chronic pain.    Dispense:  90 tablet    Refill:  0    [place] STOP ACT - Not applicable. Fill one day early if pharmacy is closed on scheduled refill date.   HYDROcodone-acetaminophen (NORCO) 10-325 MG tablet    Sig: Take 1 tablet by  mouth every 8 (eight) hours as needed for severe pain. Must last 30 days. For chronic pain.    Dispense:  90 tablet    Refill:  0    [place] STOP ACT - Not applicable. Fill one day early if pharmacy is closed on scheduled refill date.   HYDROcodone-acetaminophen (NORCO) 10-325 MG tablet    Sig: Take 1 tablet by mouth every 8 (eight) hours as needed for severe pain. Must last 30 days. For chronic pain.    Dispense:  90 tablet    Refill:  0    [place] STOP ACT - Not applicable. Fill one day early if pharmacy is closed on scheduled refill date.   Orders Placed This Encounter  Procedures   Tox[employer]SU[employer] Select 13 (MW), Urine    Volume: 30 ml(s). Minimum 3 ml of urine is needed. Document temperature of fresh sample. Indications: Long term (current) use of opiate analgesic (Z79.891)    Order Specific Question:   Release to patient    Answer:   Immediate     Follow-up plan:   Return in about 3 months (around 12/17/2022) for Medication Management, in person.     status post left L3, L4, L5, S1 RFA on 07/06/2019 and right L3, L4, L5, S1 RFA on 07/22/2019-       Recent Visits Date Type Provider Dept  09/11/22 Appointment Lateef, Bilal, MD [facility]  08/13/22 Office Visit Lateef, Bilal, MD [facility]  07/18/22 Procedure visit Lateef, Bilal, MD [facility]  Showing recent visits within past 90 days and meeting all other requirements Today's Visits Date Type Provider Dept  09/13/22 Office Visit Lateef, Bilal, MD [facility]  Showing today's visits and meeting all other requirements Future Appointments No visits were found meeting these conditions. Showing future appointments within next 90 days and meeting all other requirements  I discussed the assessment and treatment plan with the patient. The patient was provided an opportunity to ask questions and all were answered. The patient agreed with the plan and demonstrated an understanding of the  instructions.  Patient advised to call back or seek an in-person evaluation if the symptoms or condition worsens.  Duration of encounter: 30minutes.  Note by: Bilal Lateef, MD Date: 09/13/2022; Time: 8:44 AM 

## 2022-09-13 NOTE — Progress Notes (Signed)
Nursing Pain Medication Assessment:  Safety precautions to be maintained throughout the outpatient stay will include: orient to surroundings, keep bed in low position, maintain call bell within reach at all times, provide assistance with transfer out of bed and ambulation.  Medication Inspection Compliance: Pill count conducted under aseptic conditions, in front of the patient. Neither the pills nor the bottle was removed from the patient's sight at any time. Once count was completed pills were immediately returned to the patient in their original bottle.  Medication: See above Pill/Patch Count:  16 of 90 pills remain Pill/Patch Appearance: Markings consistent with prescribed medication Bottle Appearance: Standard pharmacy container. Clearly labeled. Filled Date: 8 / 20 / 2023 Last Medication intake:  Today

## 2022-09-13 NOTE — Patient Instructions (Signed)

## 2022-09-18 LAB — TOXASSURE SELECT 13 (MW), URINE

## 2022-11-30 LAB — COLOGUARD

## 2022-12-18 ENCOUNTER — Encounter: Payer: Self-pay | Admitting: Student in an Organized Health Care Education/Training Program

## 2022-12-18 ENCOUNTER — Ambulatory Visit
Payer: BC Managed Care – PPO | Attending: Student in an Organized Health Care Education/Training Program | Admitting: Student in an Organized Health Care Education/Training Program

## 2022-12-18 VITALS — BP 115/72 | HR 82 | Temp 97.4°F | Ht 64.0 in | Wt 165.0 lb

## 2022-12-18 DIAGNOSIS — M47816 Spondylosis without myelopathy or radiculopathy, lumbar region: Secondary | ICD-10-CM | POA: Diagnosis present

## 2022-12-18 DIAGNOSIS — M5136 Other intervertebral disc degeneration, lumbar region: Secondary | ICD-10-CM | POA: Insufficient documentation

## 2022-12-18 DIAGNOSIS — M170 Bilateral primary osteoarthritis of knee: Secondary | ICD-10-CM | POA: Diagnosis present

## 2022-12-18 DIAGNOSIS — G894 Chronic pain syndrome: Secondary | ICD-10-CM | POA: Insufficient documentation

## 2022-12-18 LAB — COLOGUARD: COLOGUARD: NEGATIVE

## 2022-12-18 MED ORDER — HYDROCODONE-ACETAMINOPHEN 10-325 MG PO TABS: 1.0000 | ORAL_TABLET | Freq: Three times a day (TID) | ORAL | 0 refills | Status: DC | PRN

## 2022-12-18 NOTE — Progress Notes (Signed)
Nursing Pain Medication Assessment:  Safety precautions to be maintained throughout the outpatient stay will include: orient to surroundings, keep bed in low position, maintain call bell within reach at all times, provide assistance with transfer out of bed and ambulation.  Medication Inspection Compliance: Pill count conducted under aseptic conditions, in front of the patient. Neither the pills nor the bottle was removed from the patient's sight at any time. Once count was completed pills were immediately returned to the patient in their original bottle.  Medication: Hydrocodone/APAP Pill/Patch Count:  1 of 90 pills remain Pill/Patch Appearance: Markings consistent with prescribed medication Bottle Appearance: Standard pharmacy container. Clearly labeled. Filled Date: 19 / 17 / 2023 Last Medication intake:  Today

## 2022-12-18 NOTE — Progress Notes (Signed)
PROVIDER NOTE: Information contained herein reflects review and annotations entered in association with encounter. Interpretation of such information and data should be left to medically-trained personnel. Information provided to patient can be located elsewhere in the medical record under "Patient Instructions". Document created using STT-dictation technology, any transcriptional errors that may result from process are unintentional.    Patient: Vanessa Greer  Service Category: E/M  Provider: Gillis Santa, MD  DOB: January 11, 1961  DOS: 12/18/2022  Specialty: Interventional Pain Management  MRN: [id number]  Setting: Ambulatory outpatient  PCP: Adin Hector, MD  Type: Established Patient    Referring Provider: Adin Hector, MD  Location: Office  Delivery: Face-to-face     HPI  Vanessa Greer, a 61 y.o. year old female, is here today because of her Chronic pain syndrome [G89.4]. Ms. Emmer primary complain today is Back Pain (lower)  Last encounter: My last encounter with her was on 09/13/22  Pertinent problems: Ms. Blue has Chronic bilateral low back pain with bilateral sciatica; Lumbar facet arthropathy; Lumbar spondylosis; Lumbar degenerative disc disease; Chronic pain syndrome; and Coccyodynia on their pertinent problem list. Pain Assessment: Severity of Chronic pain is reported as a 3 /10. Location: Back Lower/denies. Onset: More than a month ago. Quality: Aching, Dull, Constant. Timing: Constant. Modifying factor(s): meds, heat. Vitals:  height is _0  (1.626 m) and weight is 165 lb (74.8 kg). Her temporal temperature is 97.4 F (36.3 C) (abnormal). Her blood pressure is 115/72 and her pulse is 82. Her oxygen saturation is 100%.   Reason for encounter: both, medication management and post-procedure assessment.    Ms. Janota follows up today for medication management.  No significant changes in her medical history.  She is endorsing increased lower back pain as well as  bilateral knee pain.  She has had intra-articular knee steroid injections in the past, approximately 1 year ago with limited response.  We discussed genicular nerve block and possible genicular nerve RFA for knee pain treatment related to osteoarthritis.  Patient states that she will think about this further and let us know.  Otherwise we will refill her hydrocodone as below.  She continues Lyrica 150 mg twice a day that is managed by her PCP.  UDS up-to-date and appropriate.  Pharmacotherapy Assessment  Analgesic: Norco 10 mg TID prn   Monitoring: [place] PMP: PDMP reviewed during this encounter.       Pharmacotherapy: No side-effects or adverse reactions reported. Compliance: No problems identified. Effectiveness: Clinically acceptable.  Arlice Colt, RN  12/18/2022 10:45 AM  Sign when Signing Visit Nursing Pain Medication Assessment:  Safety precautions to be maintained throughout the outpatient stay will include: orient to surroundings, keep bed in low position, maintain call bell within reach at all times, provide assistance with transfer out of bed and ambulation.  Medication Inspection Compliance: Pill count conducted under aseptic conditions, in front of the patient. Neither the pills nor the bottle was removed from the patient's sight at any time. Once count was completed pills were immediately returned to the patient in their original bottle.  Medication: Hydrocodone/APAP Pill/Patch Count:  1 of 90 pills remain Pill/Patch Appearance: Markings consistent with prescribed medication Bottle Appearance: Standard pharmacy container. Clearly labeled. Filled Date: 65 / 17 / 2023 Last Medication intake:  Today    UDS:  Summary  Date Value Ref Range Status  09/13/2022 Note  Final    Comment:    ==================================================================== [employer] [facility] 13 (MW) ==================================================================== Specimen Alert Note:  Urinary  creatinine is very low; ability to detect some drugs may be compromised; creatinine-normalized drug concentrations should be interpreted with caution. Suggest recollection. (Creatinine) ==================================================================== Test                             Result       Flag       Units  Drug Present and Declared for Prescription Verification   Hydrocodone                    1971         EXPECTED   ng/mg creat   Norhydrocodone                 5229         EXPECTED   ng/mg creat    Sources of hydrocodone include scheduled prescription medications.    Norhydrocodone is an expected metabolite of hydrocodone.  ==================================================================== Test                      Result    Flag   Units      Ref Range   Creatinine              7         LL     mg/dL      >=20 ==================================================================== Declared Medications:  The flagging and interpretation on this report are based on the  following declared medications.  Unexpected results may arise from  inaccuracies in the declared medications.   **Note: The testing scope of this panel includes these medications:   Hydrocodone (Norco)   **Note: The testing scope of this panel does not include the  following reported medications:   Acetaminophen (Norco)  Cyclobenzaprine (Flexeril)  Furosemide (Lasix)  Multivitamin  Pantoprazole (Protonix)  Pregabalin (Lyrica)  Sertraline (Zoloft)  Simvastatin (Zocor)  Supplement  Trazodone (Desyrel) ==================================================================== For clinical consultation, please call [phone]. ====================================================================      ROS  Constitutional: Denies any fever or chills Gastrointestinal: No reported hemesis, hematochezia, vomiting, or acute GI distress Musculoskeletal:  Bilateral knee pain worse with  weightbearing Neurological: No reported episodes of acute onset apraxia, aphasia, dysarthria, agnosia, amnesia, paralysis, loss of coordination, or loss of consciousness  Medication Review  HYDROcodone-acetaminophen, cyclobenzaprine, furosemide, multivitamin with minerals, pantoprazole, pregabalin, sertraline, simvastatin, and traZODone  History Review  Allergy: Ms. Appleby is allergic to bupropion. Drug: Ms. Turbin  reports no history of drug use. Alcohol:  reports current alcohol use. Tobacco:  reports that she has been smoking cigarettes. She has a 20.50 pack-year smoking history. She has never used smokeless tobacco. Social: Ms. Gardin  reports that she has been smoking cigarettes. She has a 20.50 pack-year smoking history. She has never used smokeless tobacco. She reports current alcohol use. She reports that she does not use drugs. Medical:  has a past medical history of Anxiety, Back pain, DDD (degenerative disc disease), lumbar, Depression, GERD (gastroesophageal reflux disease), High cholesterol, Lumbar radiculitis, Pneumothorax (1982), and Sleep apnea. Surgical: Ms. Quito  has a past surgical history that includes Abdominal hysterectomy; Foot surgery; Ectopic pregnancy surgery; Colonoscopy; Laser ablation condolamata (N/A, 12/13/2017); Esophagogastroduodenoscopy (egd) with propofol (N/A, 03/17/2019); and Trigger finger release (Right). Family: family history includes Breast cancer (age of onset: 76) in her mother.  Laboratory Chemistry Profile   Renal No results found for: "BUN", "CREATININE", "LABCREA", "BCR", "GFR", "GFRAA", "GFRNONAA", "LABVMA", "EPIRU", "[id number]", "NOREPRU", "NOREPI24HUR", "  DOPARU", "[id number]"  Hepatic No results found for: "AST", "ALT", "ALBUMIN", "ALKPHOS", "HCVAB", "AMYLASE", "LIPASE", "AMMONIA"  Electrolytes No results found for: "NA", "K", "CL", "CALCIUM", "MG", "PHOS"  Bone No results found for: "VD25OH", "VD125OH2TOT", "WU1324MW1", "[id number]",  "25OHVITD1", "25OHVITD2", "25OHVITD3", "TESTOFREE", "TESTOSTERONE"  Inflammation (CRP: Acute Phase) (ESR: Chronic Phase) No results found for: "CRP", "ESRSEDRATE", "LATICACIDVEN"       Note: Above Lab results reviewed.  Recent Imaging Review  CT CHEST LUNG CA SCREEN LOW DOSE W/O CM CLINICAL DATA:  Thirty-seven pack-year smoking history. Current smoker.  EXAM: CT CHEST WITHOUT CONTRAST LOW-DOSE FOR LUNG CANCER SCREENING  TECHNIQUE: Multidetector CT imaging of the chest was performed following the standard protocol without IV contrast.  RADIATION DOSE REDUCTION: This exam was performed according to the departmental dose-optimization program which includes automated exposure control, adjustment of the mA and/or kV according to patient size and/or use of iterative reconstruction technique.  COMPARISON:  09/29/2020  FINDINGS: Cardiovascular: Aortic atherosclerosis. Mild cardiomegaly with trace, likely physiologic pericardial thickening or fluid.  Mediastinum/Nodes: No mediastinal or definite hilar adenopathy, given limitations of unenhanced CT.  Lungs/Pleura: No pleural fluid. Mild centrilobular and paraseptal emphysema. Pulmonary nodules of maximally volume derived equivalent diameter 4.6 mm. Similar. There is also a stable non solid pulmonary nodule of volume derived equivalent diameter 7.7 mm in the posterior left upper lobe.  Upper Abdomen: Variant lateral segment left liver lobe extending in the left upper quadrant. Normal imaged portions of the spleen, stomach, pancreas, gallbladder, left adrenal gland, kidneys. Low-density right adrenal 1.9 cm nodule is similar and likely an adenoma.  Musculoskeletal: No acute osseous abnormality.  IMPRESSION: 1. Lung-RADS 2, benign appearance or behavior. Continue annual screening with low-dose chest CT without contrast in 12 months. 2. Right adrenal adenoma. 3. Aortic Atherosclerosis (ICD10-I70.0) and Emphysema  (ICD10-J43.9).  Electronically Signed   By: Abigail Miyamoto M.D.   On: 03/01/2022 17:35  Note: Reviewed        Physical Exam  General appearance: Well nourished, well developed, and well hydrated. In no apparent acute distress Mental status: Alert, oriented x 3 (person, place, & time)       Respiratory: No evidence of acute respiratory distress Eyes: PERLA Vitals: BP 115/72 (BP Location: Right Arm, Patient Position: Sitting, Cuff Size: Normal)   Pulse 82   Temp (!) 97.4 F (36.3 C) (Temporal)   Ht _0  (1.626 m)   Wt 165 lb (74.8 kg)   SpO2 100%   BMI 28.32 kg/m  BMI: Estimated body mass index is 28.32 kg/m as calculated from the following:   Height as of this encounter: _1  (1.626 m).   Weight as of this encounter: 165 lb (74.8 kg). Ideal: Ideal body weight: 54.7 kg (120 lb 9.5 oz) Adjusted ideal body weight: 62.8 kg (138 lb 5.7 oz)   +LBP, pain with facet loading  Bilateral knee pain, worse with weightbearing  Assessment   Status Diagnosis  Controlled Worsening Persistent 1. Chronic pain syndrome   2. Bilateral primary osteoarthritis of knee   3. Lumbar spondylosis   4. Lumbar degenerative disc disease        Plan of Care   Vanessa Greer has a current medication list which includes the following long-term medication(s): furosemide, lyrica, sertraline, simvastatin, trazodone, hydrocodone-acetaminophen, [START ON 01/17/2023] hydrocodone-acetaminophen, and [START ON 02/16/2023] hydrocodone-acetaminophen.  Pharmacotherapy (Medications Ordered): Meds ordered this encounter  Medications   HYDROcodone-acetaminophen (NORCO) 10-325 MG tablet    Sig: Take 1 tablet by mouth every 8 (  eight) hours as needed for severe pain. Must last 30 days. For chronic pain.    Dispense:  90 tablet    Refill:  0    [place] STOP ACT - Not applicable. Fill one day early if pharmacy is closed on scheduled refill date.   HYDROcodone-acetaminophen (NORCO) 10-325 MG tablet    Sig: Take 1  tablet by mouth every 8 (eight) hours as needed for severe pain. Must last 30 days. For chronic pain.    Dispense:  90 tablet    Refill:  0    [place] STOP ACT - Not applicable. Fill one day early if pharmacy is closed on scheduled refill date.   HYDROcodone-acetaminophen (NORCO) 10-325 MG tablet    Sig: Take 1 tablet by mouth every 8 (eight) hours as needed for severe pain. Must last 30 days. For chronic pain.    Dispense:  90 tablet    Refill:  0    [place] STOP ACT - Not applicable. Fill one day early if pharmacy is closed on scheduled refill date.  Encourage patient to continue with lumbar stretching and strengthening exercises that she has learned in the past. Discussed bilateral genicular nerve block and possible RFA for knee pain related to knee osteoarthritis.   Follow-up plan:   Return in about 3 months (around 03/19/2023) for Medication Management, in person.     status post left L3, L4, L5, S1 RFA on 07/06/2019 and right L3, L4, L5, S1 RFA on 07/22/2019-       Recent Visits No visits were found meeting these conditions. Showing recent visits within past 90 days and meeting all other requirements Today's Visits Date Type Provider Dept  12/18/22 Office Visit Gillis Santa, MD [facility]  Showing today's visits and meeting all other requirements Future Appointments Date Type Provider Dept  03/12/23 Appointment Gillis Santa, MD [facility]  Showing future appointments within next 90 days and meeting all other requirements  I discussed the assessment and treatment plan with the patient. The patient was provided an opportunity to ask questions and all were answered. The patient agreed with the plan and demonstrated an understanding of the instructions.  Patient advised to call back or seek an in-person evaluation if the symptoms or condition worsens.  Duration of encounter: 45mnutes.  Note by: BGillis Santa MD Date: 12/18/2022; Time: 11:21 AM

## 2023-02-11 ENCOUNTER — Other Ambulatory Visit: Payer: Self-pay | Admitting: Internal Medicine

## 2023-02-11 DIAGNOSIS — Z1231 Encounter for screening mammogram for malignant neoplasm of breast: Secondary | ICD-10-CM

## 2023-02-22 ENCOUNTER — Ambulatory Visit
Admission: RE | Admit: 2023-02-22 | Discharge: 2023-02-22 | Disposition: A | Discharge status: Home / Self Care | Payer: BC Managed Care – PPO | Source: Ambulatory Visit | Attending: Internal Medicine | Admitting: Internal Medicine

## 2023-02-22 DIAGNOSIS — Z1231 Encounter for screening mammogram for malignant neoplasm of breast: Secondary | ICD-10-CM | POA: Insufficient documentation

## 2023-03-01 ENCOUNTER — Ambulatory Visit
Admission: RE | Admit: 2023-03-01 | Discharge: 2023-03-01 | Disposition: A | Discharge status: Home / Self Care | Payer: BC Managed Care – PPO | Source: Ambulatory Visit | Attending: Internal Medicine | Admitting: Internal Medicine

## 2023-03-01 DIAGNOSIS — Z87891 Personal history of nicotine dependence: Secondary | ICD-10-CM | POA: Diagnosis present

## 2023-03-01 DIAGNOSIS — I2510 Atherosclerotic heart disease of native coronary artery without angina pectoris: Secondary | ICD-10-CM | POA: Insufficient documentation

## 2023-03-01 DIAGNOSIS — J432 Centrilobular emphysema: Secondary | ICD-10-CM | POA: Diagnosis not present

## 2023-03-01 DIAGNOSIS — F17210 Nicotine dependence, cigarettes, uncomplicated: Secondary | ICD-10-CM | POA: Diagnosis present

## 2023-03-01 DIAGNOSIS — I700 Atherosclerosis of aorta: Secondary | ICD-10-CM | POA: Insufficient documentation

## 2023-03-01 DIAGNOSIS — Z122 Encounter for screening for malignant neoplasm of respiratory organs: Secondary | ICD-10-CM | POA: Insufficient documentation

## 2023-03-07 ENCOUNTER — Telehealth: Payer: Self-pay | Admitting: Acute Care

## 2023-03-07 DIAGNOSIS — R911 Solitary pulmonary nodule: Secondary | ICD-10-CM

## 2023-03-07 DIAGNOSIS — Z87891 Personal history of nicotine dependence: Secondary | ICD-10-CM

## 2023-03-07 NOTE — Telephone Encounter (Signed)
I have called the patient with the results of her low-dose screening CT.  I explained that her scan was read as a lung RADS 4A, suspicious. There are multiple new pulmonary nodules scattered throughout the lungs bilaterally the largest of which is 7.9 mm in the left upper lobe.  I explained that the nodule is too small for additional imaging at this time but that we want to take another look in 3 months to reevaluate for stability. We also discussed the finding of aortic atherosclerosis and coronary artery disease.  She is currently on statin therapy.  She does have a family history of cardiac disease on her father side.  He had a CABG in his late 82s. I have encouraged the patient to speak with her primary care doctor regarding potential for cardiology consult with annual visits to continue to monitor this. She is in agreement with this plan. Denise please order scan for 79-monthfollow-up, let PCP know plan is for 355-monthollow-up to reevaluate 7.9 mm left upper lobe nodule first debility.  Thanks so much

## 2023-03-07 NOTE — Telephone Encounter (Signed)
I have attempted to call the patient with the results of their  Low Dose CT Chest Lung cancer screening scan. There was no answer. I have left a HIPPA compliant VM requesting the patient call the office for the scan results. I included the office contact information in the message. We will await his return call. If no return call we will continue to call until patient is contacted.

## 2023-03-07 NOTE — Telephone Encounter (Signed)
See other telephone note from 03/07/23

## 2023-03-07 NOTE — Telephone Encounter (Signed)
Results/plans faxed to PCP. Order placed fpr 3 mth nodule f/u CT.

## 2023-03-07 NOTE — Telephone Encounter (Signed)
PT ret Ms. Gladstone Pih call. Pls call back.

## 2023-03-12 ENCOUNTER — Encounter: Payer: Self-pay | Admitting: Student in an Organized Health Care Education/Training Program

## 2023-03-12 ENCOUNTER — Ambulatory Visit
Payer: BC Managed Care – PPO | Attending: Student in an Organized Health Care Education/Training Program | Admitting: Student in an Organized Health Care Education/Training Program

## 2023-03-12 VITALS — BP 108/60 | HR 60 | Temp 97.5°F | Resp 16 | Ht 65.0 in | Wt 168.0 lb

## 2023-03-12 DIAGNOSIS — M47816 Spondylosis without myelopathy or radiculopathy, lumbar region: Secondary | ICD-10-CM | POA: Insufficient documentation

## 2023-03-12 DIAGNOSIS — M170 Bilateral primary osteoarthritis of knee: Secondary | ICD-10-CM | POA: Insufficient documentation

## 2023-03-12 DIAGNOSIS — G894 Chronic pain syndrome: Secondary | ICD-10-CM | POA: Insufficient documentation

## 2023-03-12 DIAGNOSIS — M5136 Other intervertebral disc degeneration, lumbar region: Secondary | ICD-10-CM | POA: Insufficient documentation

## 2023-03-12 DIAGNOSIS — M5416 Radiculopathy, lumbar region: Secondary | ICD-10-CM | POA: Insufficient documentation

## 2023-03-12 DIAGNOSIS — M533 Sacrococcygeal disorders, not elsewhere classified: Secondary | ICD-10-CM | POA: Diagnosis present

## 2023-03-12 MED ORDER — HYDROCODONE-ACETAMINOPHEN 10-325 MG PO TABS: 1.0000 | ORAL_TABLET | Freq: Three times a day (TID) | ORAL | 0 refills | Status: DC | PRN

## 2023-03-12 NOTE — Progress Notes (Signed)
Nursing Pain Medication Assessment:  Safety precautions to be maintained throughout the outpatient stay will include: orient to surroundings, keep bed in low position, maintain call bell within reach at all times, provide assistance with transfer out of bed and ambulation.  Medication Inspection Compliance: Pill count conducted under aseptic conditions, in front of the patient. Neither the pills nor the bottle was removed from the patient's sight at any time. Once count was completed pills were immediately returned to the patient in their original bottle.  Medication: Hydrocodone/APAP Pill/Patch Count:  26 of 90 pills remain Pill/Patch Appearance: Markings consistent with prescribed medication Bottle Appearance: Standard pharmacy container. Clearly labeled. Filled Date: 2 / 20 / 2024 Last Medication intake:  Today

## 2023-03-12 NOTE — Progress Notes (Signed)
PROVIDER NOTE: Information contained herein reflects review and annotations entered in association with encounter. Interpretation of such information and data should be left to medically-trained personnel. Information provided to patient can be located elsewhere in the medical record under "Patient Instructions". Document created using STT-dictation technology, any transcriptional errors that may result from process are unintentional.    Patient: Vanessa Greer  Service Category: E/M  Provider: Gillis Santa, MD  DOB: August 28, 1961  DOS: 03/12/2023  Specialty: Interventional Pain Management  MRN: [id number]  Setting: Ambulatory outpatient  PCP: Adin Hector, MD  Type: Established Patient    Referring Provider: Adin Hector, MD  Location: Office  Delivery: Face-to-face     HPI  [name], a 62 y.o. year old female, is here today because of her Chronic pain syndrome [G89.4]. [name] primary complain today is Back Pain (lower)  Last encounter: My last encounter with her was on 12/18/22  Pertinent problems: [name] has Chronic bilateral low back pain with bilateral sciatica; Lumbar facet arthropathy; Lumbar spondylosis; Lumbar degenerative disc disease; Chronic pain syndrome; and Coccyodynia on their pertinent problem list. Pain Assessment: Severity of Chronic pain is reported as a 0-No pain (no pain "I took my medication this morning")/10. Location: Back Lower/denies today. Onset: More than a month ago. Quality: Aching, Burning, Nagging ("When I have pain"). Timing: Intermittent. Modifying factor(s): medications. Vitals:  height is '5\' 5"'$  (1.651 m) and weight is 168 lb (76.2 kg). Her temperature is 97.5 F (36.4 C) (abnormal). Her blood pressure is 108/60 and her pulse is 60. Her respiration is 16 and oxygen saturation is 100%.   Reason for encounter: both, medication management and post-procedure assessment.    [name] follows up today for medication management.  Had a CT  scan revealing a large pulmonary nodule (left upper lobe). Repeat in 3 months. Today we will refill her hydrocodone as below.  She continues Lyrica 150 mg twice a day that is managed by her PCP.  UDS up-to-date and appropriate.  Pharmacotherapy Assessment  Analgesic: Norco 10 mg TID prn   Monitoring: [place] PMP: PDMP reviewed during this encounter.       Pharmacotherapy: No side-effects or adverse reactions reported. Compliance: No problems identified. Effectiveness: Clinically acceptable.  Vanessa Specking, RN  03/12/2023  8:11 AM  Sign when Signing Visit Nursing Pain Medication Assessment:  Safety precautions to be maintained throughout the outpatient stay will include: orient to surroundings, keep bed in low position, maintain call bell within reach at all times, provide assistance with transfer out of bed and ambulation.  Medication Inspection Compliance: Pill count conducted under aseptic conditions, in front of the patient. Neither the pills nor the bottle was removed from the patient's sight at any time. Once count was completed pills were immediately returned to the patient in their original bottle.  Medication: Hydrocodone/APAP Pill/Patch Count:  26 of 90 pills remain Pill/Patch Appearance: Markings consistent with prescribed medication Bottle Appearance: Standard pharmacy container. Clearly labeled. Filled Date: 2 / 2 / 2024 Last Medication intake:  Today    UDS:  Summary  Date Value Ref Range Status  09/13/2022 Note  Final    Comment:    ==================================================================== [employer] (MW) ==================================================================== Specimen Alert Note: Urinary creatinine is very low; ability to detect some drugs may be compromised; creatinine-normalized drug concentrations should be interpreted with caution. Suggest recollection.  (Creatinine) ==================================================================== Test  Result       Flag       Units  Drug Present and Declared for Prescription Verification   Hydrocodone                    1971         EXPECTED   ng/mg creat   Norhydrocodone                 5229         EXPECTED   ng/mg creat    Sources of hydrocodone include scheduled prescription medications.    Norhydrocodone is an expected metabolite of hydrocodone.  ==================================================================== Test                      Result    Flag   Units      Ref Range   Creatinine              7         LL     mg/dL      >=20 ==================================================================== Declared Medications:  The flagging and interpretation on this report are based on the  following declared medications.  Unexpected results may arise from  inaccuracies in the declared medications.   **Note: The testing scope of this panel includes these medications:   Hydrocodone (Norco)   **Note: The testing scope of this panel does not include the  following reported medications:   Acetaminophen (Norco)  Cyclobenzaprine (Flexeril)  Furosemide (Lasix)  Multivitamin  Pantoprazole (Protonix)  Pregabalin (Lyrica)  Sertraline (Zoloft)  Simvastatin (Zocor)  Supplement  Trazodone (Desyrel) ==================================================================== For clinical consultation, please call [phone]. ====================================================================      ROS  Constitutional: Denies any fever or chills Gastrointestinal: No reported hemesis, hematochezia, vomiting, or acute GI distress Musculoskeletal:  Bilateral knee pain worse with weightbearing Neurological: No reported episodes of acute onset apraxia, aphasia, dysarthria, agnosia, amnesia, paralysis, loss of coordination, or loss of consciousness  Medication Review   HYDROcodone-acetaminophen, cyclobenzaprine, furosemide, multivitamin with minerals, pantoprazole, pregabalin, sertraline, simvastatin, and traZODone  History Review  Allergy: Ms. [name] is allergic to bupropion. Drug: Ms. [name]  reports no history of drug use. Alcohol:  reports current alcohol use. Tobacco:  reports that she has been smoking cigarettes. She has a 20.50 pack-year smoking history. She has never used smokeless tobacco. Social: Ms. Cure  reports that she has been smoking cigarettes. She has a 20.50 pack-year smoking history. She has never used smokeless tobacco. She reports current alcohol use. She reports that she does not use drugs. Medical:  has a past medical history of Anxiety, Back pain, DDD (degenerative disc disease), lumbar, Depression, GERD (gastroesophageal reflux disease), High cholesterol, Lumbar radiculitis, Pneumothorax (1982), and Sleep apnea. Surgical: Ms. Emanuel  has a past surgical history that includes Abdominal hysterectomy; Foot surgery; Ectopic pregnancy surgery; Colonoscopy; Laser ablation condolamata (N/A, 12/13/2017); Esophagogastroduodenoscopy (egd) with propofol (N/A, 03/17/2019); and Trigger finger release (Right). Family: family history includes Breast cancer (age of onset: 22) in her mother.  Laboratory Chemistry Profile   Renal No results found for: "BUN", "CREATININE", "LABCREA", "BCR", "GFR", "GFRAA", "GFRNONAA", "LABVMA", "EPIRU", "EPINEPH24HUR", "NOREPRU", "NOREPI24HUR", "DOPARU", "DOPAM24HRUR"  Hepatic No results found for: "AST", "ALT", "ALBUMIN", "ALKPHOS", "HCVAB", "AMYLASE", "LIPASE", "AMMONIA"  Electrolytes No results found for: "NA", "K", "CL", "CALCIUM", "MG", "PHOS"  Bone No results found for: "VD25OH", "VD125OH2TOT", "[id number]", "[id number]", "25OHVITD1", "25OHVITD2", "25OHVITD3", "TESTOFREE", "TESTOSTERONE"  Inflammation (CRP: Acute Phase) (ESR: Chronic Phase) No results found for: "  CRP", "ESRSEDRATE", "LATICACIDVEN"        Note: Above Lab results reviewed.  Recent Imaging Review  CT CHEST LUNG CA SCREEN LOW DOSE W/O CM CLINICAL DATA:  62 year old female current smoker with 38 pack-year history of smoking. Lung cancer screening examination.  EXAM: CT CHEST WITHOUT CONTRAST LOW-DOSE FOR LUNG CANCER SCREENING  TECHNIQUE: Multidetector CT imaging of the chest was performed following the standard protocol without IV contrast.  RADIATION DOSE REDUCTION: This exam was performed according to the departmental dose-optimization program which includes automated exposure control, adjustment of the mA and/or kV according to patient size and/or use of iterative reconstruction technique.  COMPARISON:  Low-dose lung cancer screening chest CT 02/28/2022.  FINDINGS: Cardiovascular: Heart size is normal. Small amount of pericardial fluid and/or thickening, unlikely to be of hemodynamic significance, similar to the prior study. No pericardial calcification. Aortic atherosclerosis. No definite coronary artery calcifications.  Mediastinum/Nodes: No pathologically enlarged mediastinal or hilar lymph nodes. Please note that accurate exclusion of hilar adenopathy is limited on noncontrast CT scans. Esophagus is unremarkable in appearance. No axillary lymphadenopathy.  Lungs/Pleura: There are numerous new pulmonary nodules scattered throughout the lungs bilaterally, largest of which is in the left upper lobe (axial image 44) with a volume derived mean diameter of 7.9 mm. No acute consolidative airspace disease. No pleural effusions. Diffuse bronchial wall thickening with mild centrilobular and paraseptal emphysema.  Upper Abdomen: Aortic atherosclerosis.  Musculoskeletal: There are no aggressive appearing lytic or blastic lesions noted in the visualized portions of the skeleton.  IMPRESSION: 1. Multiple new pulmonary nodules scattered throughout the lungs bilaterally, largesta of which are categorized Lung-RADS  4AS, suspicious. Follow up low-dose chest CT without contrast in 3 months (please use the following order, "CT CHEST LCS NODULE FOLLOW-UP W/O CM") is recommended. Alternatively, PET may be considered when there is a solid component 32m or larger. 2. The "S" modifier above refers to potentially clinically significant non lung cancer related findings. Specifically, there is aortic atherosclerosis, in addition to aortic atherosclerosis coronary artery disease. Please note that although the presence of coronary artery calcium documents the presence of coronary artery disease, the severity of this disease and any potential stenosis cannot be assessed on this non-gated CT examination. Assessment for potential risk factor modification, dietary therapy or pharmacologic therapy may be warranted, if clinically indicated. 3. Mild diffuse bronchial wall thickening with mild centrilobular and paraseptal emphysema; imaging findings suggestive of underlying COPD.  Aortic Atherosclerosis (ICD10-I70.0) and Emphysema (ICD10-J43.9).  Electronically Signed   By: DVinnie LangtonM.D.   On: 03/04/2023 09:40  Note: Reviewed        Physical Exam  General appearance: Well nourished, well developed, and well hydrated. In no apparent acute distress Mental status: Alert, oriented x 3 (person, place, & time)       Respiratory: No evidence of acute respiratory distress Eyes: PERLA Vitals: BP 108/60   Pulse 60   Temp (!) 97.5 F (36.4 C)   Resp 16   Ht '5\' 5"'$  (1.651 m)   Wt 168 lb (76.2 kg)   SpO2 100%   BMI 27.96 kg/m  BMI: Estimated body mass index is 27.96 kg/m as calculated from the following:   Height as of this encounter: '5\' 5"'$  (1.651 m).   Weight as of this encounter: 168 lb (76.2 kg). Ideal: Ideal body weight: 57 kg (125 lb 10.6 oz) Adjusted ideal body weight: 64.7 kg (142 lb 9.6 oz)   +LBP, pain with facet loading  Bilateral knee  pain, worse with weightbearing  Assessment   Status  Diagnosis  Controlled Controlled Controlled 1. Chronic pain syndrome   2. Bilateral primary osteoarthritis of knee   3. Lumbar spondylosis   4. Lumbar degenerative disc disease   5. Sacroiliac joint pain   6. Lumbar radiculopathy   7. [place]   Ms. KEISI HOVEN has a current medication list which includes the following long-term medication(s): furosemide, lyrica, sertraline, simvastatin, trazodone, [START ON 03/19/2023] hydrocodone-acetaminophen, [START ON 04/18/2023] hydrocodone-acetaminophen, and [START ON 05/18/2023] hydrocodone-acetaminophen.  Pharmacotherapy (Medications Ordered): Meds ordered this encounter  Medications   HYDROcodone-acetaminophen (NORCO) 10-325 MG tablet    Sig: Take 1 tablet by mouth every 8 (eight) hours as needed for severe pain. Must last 30 days. For chronic pain.    Dispense:  90 tablet    Refill:  0    [place] STOP ACT - Not applicable. Fill one day early if pharmacy is closed on scheduled refill date.   HYDROcodone-acetaminophen (NORCO) 10-325 MG tablet    Sig: Take 1 tablet by mouth every 8 (eight) hours as needed for severe pain. Must last 30 days. For chronic pain.    Dispense:  90 tablet    Refill:  0    [place] STOP ACT - Not applicable. Fill one day early if pharmacy is closed on scheduled refill date.   HYDROcodone-acetaminophen (NORCO) 10-325 MG tablet    Sig: Take 1 tablet by mouth every 8 (eight) hours as needed for severe pain. Must last 30 days. For chronic pain.    Dispense:  90 tablet    Refill:  0    [place] STOP ACT - Not applicable. Fill one day early if pharmacy is closed on scheduled refill date.  Encourage patient to continue with lumbar stretching and strengthening exercises that she has learned in the past. Discussed bilateral genicular nerve block and possible RFA for knee pain related to knee osteoarthritis.   Follow-up plan:   Return in about 3 months (around 06/12/2023) for Medication Management, in person.      status post left L3, L4, L5, S1 RFA on 07/06/2019 and right L3, L4, L5, S1 RFA on 07/22/2019-       Recent Visits Date Type Provider Dept  12/18/22 Office Visit Gillis Santa, MD [facility]  Showing recent visits within past 90 days and meeting all other requirements Today's Visits Date Type Provider Dept  03/12/23 Office Visit Gillis Santa, MD [facility]  Showing today's visits and meeting all other requirements Future Appointments No visits were found meeting these conditions. Showing future appointments within next 90 days and meeting all other requirements  I discussed the assessment and treatment plan with the patient. The patient was provided an opportunity to ask questions and all were answered. The patient agreed with the plan and demonstrated an understanding of the instructions.  Patient advised to call back or seek an in-person evaluation if the symptoms or condition worsens.  Duration of encounter: 65mnutes.  Note by: BGillis Santa MD Date: 03/12/2023; Time: 8:22 AM

## 2023-06-03 ENCOUNTER — Ambulatory Visit
Admission: RE | Admit: 2023-06-03 | Discharge: 2023-06-03 | Disposition: A | Discharge status: Home / Self Care | Payer: BC Managed Care – PPO | Source: Ambulatory Visit | Attending: Acute Care | Admitting: Acute Care

## 2023-06-03 DIAGNOSIS — R911 Solitary pulmonary nodule: Secondary | ICD-10-CM | POA: Diagnosis present

## 2023-06-03 DIAGNOSIS — Z87891 Personal history of nicotine dependence: Secondary | ICD-10-CM | POA: Diagnosis present

## 2023-06-11 ENCOUNTER — Encounter: Payer: Self-pay | Admitting: Student in an Organized Health Care Education/Training Program

## 2023-06-11 ENCOUNTER — Ambulatory Visit
Payer: BC Managed Care – PPO | Attending: Student in an Organized Health Care Education/Training Program | Admitting: Student in an Organized Health Care Education/Training Program

## 2023-06-11 VITALS — BP 137/69 | HR 58 | Temp 97.1°F | Ht 64.0 in | Wt 161.0 lb

## 2023-06-11 DIAGNOSIS — G8929 Other chronic pain: Secondary | ICD-10-CM | POA: Diagnosis present

## 2023-06-11 DIAGNOSIS — M542 Cervicalgia: Secondary | ICD-10-CM | POA: Insufficient documentation

## 2023-06-11 DIAGNOSIS — G894 Chronic pain syndrome: Secondary | ICD-10-CM | POA: Diagnosis present

## 2023-06-11 DIAGNOSIS — M546 Pain in thoracic spine: Secondary | ICD-10-CM | POA: Diagnosis not present

## 2023-06-11 DIAGNOSIS — M5136 Other intervertebral disc degeneration, lumbar region: Secondary | ICD-10-CM | POA: Insufficient documentation

## 2023-06-11 MED ORDER — DICLOFENAC SODIUM 50 MG PO TBEC: 50.0000 mg | DELAYED_RELEASE_TABLET | Freq: Two times a day (BID) | ORAL | 0 refills | Status: DC

## 2023-06-11 MED ORDER — HYDROCODONE-ACETAMINOPHEN 10-325 MG PO TABS: 1.0000 | ORAL_TABLET | Freq: Three times a day (TID) | ORAL | 0 refills | Status: DC | PRN

## 2023-06-11 NOTE — Progress Notes (Signed)
PROVIDER NOTE: Information contained herein reflects review and annotations entered in association with encounter. Interpretation of such information and data should be left to medically-trained personnel. Information provided to patient can be located elsewhere in the medical record under "Patient Instructions". Document created using STT-dictation technology, any transcriptional errors that may result from process are unintentional.    Patient: Vanessa Greer  Service Category: E/M  Provider: Edward Jolly, MD  DOB: 22-Jan-1961  DOS: 06/11/2023  Specialty: Interventional Pain Management  MRN: [id number]  Setting: Ambulatory outpatient  PCP: Lynnea Ferrier, MD  Type: Established Patient    Referring Provider: Lynnea Ferrier, MD  Location: Office  Delivery: Face-to-face     HPI  Vanessa Greer, a 62 y.o. year old female, is here today because of her Chronic bilateral thoracic back pain [M54.6, G89.29]. Vanessa Greer primary complain today is Back Pain (Pt states no pain today)  Last encounter: My last encounter with her was on 12/18/22  Pertinent problems: Vanessa Greer has Chronic bilateral low back pain with bilateral sciatica; Lumbar facet arthropathy; Lumbar spondylosis; Lumbar degenerative disc disease; Chronic pain syndrome; and Coccyodynia on their pertinent problem list. Pain Assessment: Severity of Chronic pain is reported as a 0-No pain/10. Location: Back Lower/denies. Onset: More than a month ago. Quality: Aching, Constant, Burning. Timing: Constant. Modifying factor(s): meds, heat. Vitals:  height is 5\' 4"  (1.626 m) and weight is 161 lb (73 kg). Her temporal temperature is 97.1 F (36.2 C) (abnormal). Her blood pressure is 137/69 and her pulse is 58 (abnormal). Her oxygen saturation is 100%.   Reason for encounter: medication management.    Vanessa Greer follows up today for medication management.  Increased c-spine pain and thoracic back pain. No inciting or traumatic event. Pain  worse when she is standing, sitting up, better when she lays down. No hx of shingles or URI in the last 3 months States that stress will make it worse No imaging studies done for workup yet Discussed short course of NSAID trial, Cr wnl Today we will refill her hydrocodone as below.  She continues Lyrica 150 mg twice a day that is managed by her PCP.  UDS up-to-date and appropriate.  Pharmacotherapy Assessment  Analgesic: Norco 10 mg TID prn   Monitoring: [place] PMP: PDMP reviewed during this encounter.       Pharmacotherapy: No side-effects or adverse reactions reported. Compliance: No problems identified. Effectiveness: Clinically acceptable.  Florina Ou, RN  06/11/2023  8:25 AM  Sign when Signing Visit Nursing Pain Medication Assessment:  Safety precautions to be maintained throughout the outpatient stay will include: orient to surroundings, keep bed in low position, maintain call bell within reach at all times, provide assistance with transfer out of bed and ambulation.  Medication Inspection Compliance: Pill count conducted under aseptic conditions, in front of the patient. Neither the pills nor the bottle was removed from the patient's sight at any time. Once count was completed pills were immediately returned to the patient in their original bottle.  Medication: Hydrocodone/APAP Pill/Patch Count:  21 of 90 pills remain Pill/Patch Appearance: Markings consistent with prescribed medication Bottle Appearance: Standard pharmacy container. Clearly labeled. Filled Date: 5 / 17 / 2024 Last Medication intake:  TodaySafety precautions to be maintained throughout the outpatient stay will include: orient to surroundings, keep bed in low position, maintain call bell within reach at all times, provide assistance with transfer out of bed and ambulation.     UDS:  Summary  Date Value Ref Range Status  09/13/2022 Note  Final    Comment:     ==================================================================== [employer] Select 13 (MW) ==================================================================== Specimen Alert Note: Urinary creatinine is very low; ability to detect some drugs may be compromised; creatinine-normalized drug concentrations should be interpreted with caution. Suggest recollection. (Creatinine) ==================================================================== Test                             Result       Flag       Units  Drug Present and Declared for Prescription Verification   Hydrocodone                    1971         EXPECTED   ng/mg creat   Norhydrocodone                 5229         EXPECTED   ng/mg creat    Sources of hydrocodone include scheduled prescription medications.    Norhydrocodone is an expected metabolite of hydrocodone.  ==================================================================== Test                      Result    Flag   Units      Ref Range   Creatinine              7         LL     mg/dL      >=08 ==================================================================== Declared Medications:  The flagging and interpretation on this report are based on the  following declared medications.  Unexpected results may arise from  inaccuracies in the declared medications.   **Note: The testing scope of this panel includes these medications:   Hydrocodone (Norco)   **Note: The testing scope of this panel does not include the  following reported medications:   Acetaminophen (Norco)  Cyclobenzaprine (Flexeril)  Furosemide (Lasix)  Multivitamin  Pantoprazole (Protonix)  Pregabalin (Lyrica)  Sertraline (Zoloft)  Simvastatin (Zocor)  Supplement  Trazodone (Desyrel) ==================================================================== For clinical consultation, please call [phone]. ====================================================================      ROS   Constitutional: Denies any fever or chills Gastrointestinal: No reported hemesis, hematochezia, vomiting, or acute GI distress Musculoskeletal:  cervical and thoracic pain Neurological: No reported episodes of acute onset apraxia, aphasia, dysarthria, agnosia, amnesia, paralysis, loss of coordination, or loss of consciousness  Medication Review  HYDROcodone-acetaminophen, cyclobenzaprine, diclofenac, furosemide, multivitamin with minerals, pantoprazole, pregabalin, sertraline, simvastatin, and traZODone  History Review  Allergy: Vanessa Greer is allergic to bupropion. Drug: Vanessa Greer  reports no history of drug use. Alcohol:  reports current alcohol use. Tobacco:  reports that she has been smoking cigarettes. She has a 20.50 pack-year smoking history. She has never used smokeless tobacco. Social: Vanessa Greer  reports that she has been smoking cigarettes. She has a 20.50 pack-year smoking history. She has never used smokeless tobacco. She reports current alcohol use. She reports that she does not use drugs. Medical:  has a past medical history of Anxiety, Back pain, DDD (degenerative disc disease), lumbar, Depression, GERD (gastroesophageal reflux disease), High cholesterol, Lumbar radiculitis, Pneumothorax (1982), and Sleep apnea. Surgical: Vanessa Greer  has a past surgical history that includes Abdominal hysterectomy; Foot surgery; Ectopic pregnancy surgery; Colonoscopy; Laser ablation condolamata (N/A, 12/13/2017); Esophagogastroduodenoscopy (egd) with propofol (N/A, 03/17/2019); and Trigger finger release (Right). Family: family history includes Breast cancer (age of onset: 2) in  her mother.  Laboratory Chemistry Profile   Renal No results found for: "BUN", "CREATININE", "LABCREA", "BCR", "GFR", "GFRAA", "GFRNONAA", "LABVMA", "EPIRU", "EPINEPH24HUR", "NOREPRU", "NOREPI24HUR", "DOPARU", "DOPAM24HRUR"  Hepatic No results found for: "AST", "ALT", "ALBUMIN", "ALKPHOS", "HCVAB", "AMYLASE",  "LIPASE", "AMMONIA"  Electrolytes No results found for: "NA", "K", "CL", "CALCIUM", "MG", "PHOS"  Bone No results found for: "VD25OH", "VD125OH2TOT", "ZO1096EA5", "WU9811BJ4", "25OHVITD1", "25OHVITD2", "25OHVITD3", "TESTOFREE", "TESTOSTERONE"  Inflammation (CRP: Acute Phase) (ESR: Chronic Phase) No results found for: "CRP", "ESRSEDRATE", "LATICACIDVEN"       Note: Above Lab results reviewed.  Recent Imaging Review  CT CHEST LCS NODULE F/U LOW DOSE WO CONTRAST CLINICAL DATA:  Abnormal lung cancer screening. Lung nodule. Current smoker, 38 pack-year history.  EXAM: CT CHEST WITHOUT CONTRAST FOR LUNG CANCER SCREENING NODULE FOLLOW-UP  TECHNIQUE: Multidetector CT imaging of the chest was performed following the standard protocol without IV contrast.  RADIATION DOSE REDUCTION: This exam was performed according to the departmental dose-optimization program which includes automated exposure control, adjustment of the mA and/or kV according to patient size and/or use of iterative reconstruction technique.  COMPARISON:  03/01/2023 and 02/28/2022.  FINDINGS: Cardiovascular: Atherosclerotic calcification of the aorta. Heart is enlarged. Small amount of pericardial fluid is unchanged and likely physiologic.  Mediastinum/Nodes: No pathologically enlarged mediastinal or axillary lymph nodes. Hilar regions are difficult to definitively evaluate without IV contrast. Esophagus is grossly unremarkable.  Lungs/Pleura: Centrilobular and paraseptal emphysema. Smoking related respiratory bronchiolitis. Waxing and waning centrilobular nodularity in the upper and midlung zones with overall progression from 03/01/2023. Findings are largely new from 02/28/2022. Largest new pulmonary nodule in the interval from 03/01/2023 measures 7.3 mm in the right lower lobe (4/137). No pleural fluid. Airway is unremarkable.  Upper Abdomen: Visualized portions of the liver and gallbladder are unremarkable. 2.8  x 2.7 cm right adrenal nodule measures 8 Hounsfield units. No follow-up necessary. Visualized portions of the left adrenal gland, kidneys, spleen, pancreas, stomach and bowel are grossly unremarkable. No upper abdominal adenopathy.  Musculoskeletal: Degenerative changes in the spine. No worrisome lytic or sclerotic lesions.  IMPRESSION: 1. Waxing and waning upper and midlung zone predominant centrilobular nodularity, overall progressive from 03/01/2023 and largely new from 02/28/2022. The possibility of an infectious/inflammatory etiology, such as sarcoid, is entertained. Malignancy cannot be excluded. Unfortunately, patient is considered a poor candidate for lung cancer screening in the current setting. Strictly speaking, exam is categorized as Lung-RADS 4A, suspicious. Follow up low-dose chest CT without contrast in 3 months (please use the following order, "CT CHEST LCS NODULE FOLLOW-UP W/O CM") is recommended. Alternatively, PET may be considered when there is a solid component 8mm or larger. 2. Right adrenal adenoma. 3.  Aortic atherosclerosis (ICD10-I70.0). 4.  Emphysema (ICD10-J43.9).  Electronically Signed   By: Leanna Battles M.D.   On: 06/10/2023 09:26  Note: Reviewed        Physical Exam  General appearance: Well nourished, well developed, and well hydrated. In no apparent acute distress Mental status: Alert, oriented x 3 (person, place, & time)       Respiratory: No evidence of acute respiratory distress Eyes: PERLA Vitals: BP 137/69   Pulse (!) 58   Temp (!) 97.1 F (36.2 C) (Temporal)   Ht 5\' 4"  (1.626 m)   Wt 161 lb (73 kg)   SpO2 100%   BMI 27.64 kg/m  BMI: Estimated body mass index is 27.64 kg/m as calculated from the following:   Height as of this encounter: 5\' 4"  (1.626 m).  Weight as of this encounter: 161 lb (73 kg). Ideal: Ideal body weight: 54.7 kg (120 lb 9.5 oz) Adjusted ideal body weight: 62 kg (136 lb 12.1 oz)  Cervical and thoracic  pain   +LBP, pain with facet loading  Bilateral knee pain, worse with weightbearing  Assessment   Status Diagnosis  Having a Flare-up Having a Flare-up Controlled 1. Chronic bilateral thoracic back pain   2. Cervicalgia   3. Lumbar degenerative disc disease   4. Chronic pain syndrome          Plan of Care   Vanessa Greer has a current medication list which includes the following long-term medication(s): furosemide, lyrica, sertraline, simvastatin, trazodone, [START ON 06/18/2023] hydrocodone-acetaminophen, [START ON 07/18/2023] hydrocodone-acetaminophen, and [START ON 08/17/2023] hydrocodone-acetaminophen.  Pharmacotherapy (Medications Ordered): Meds ordered this encounter  Medications   HYDROcodone-acetaminophen (NORCO) 10-325 MG tablet    Sig: Take 1 tablet by mouth every 8 (eight) hours as needed for severe pain. Must last 30 days. For chronic pain.    Dispense:  90 tablet    Refill:  0    [place] STOP ACT - Not applicable. Fill one day early if pharmacy is closed on scheduled refill date.   HYDROcodone-acetaminophen (NORCO) 10-325 MG tablet    Sig: Take 1 tablet by mouth every 8 (eight) hours as needed for severe pain. Must last 30 days. For chronic pain.    Dispense:  90 tablet    Refill:  0    [place] STOP ACT - Not applicable. Fill one day early if pharmacy is closed on scheduled refill date.   HYDROcodone-acetaminophen (NORCO) 10-325 MG tablet    Sig: Take 1 tablet by mouth every 8 (eight) hours as needed for severe pain. Must last 30 days. For chronic pain.    Dispense:  90 tablet    Refill:  0    [place] STOP ACT - Not applicable. Fill one day early if pharmacy is closed on scheduled refill date.   diclofenac (VOLTAREN) 50 MG EC tablet    Sig: Take 1 tablet (50 mg total) by mouth 2 (two) times daily after a meal.    Dispense:  60 tablet    Refill:  0   Orders Placed This Encounter  Procedures   DG Cervical Spine Complete    Patient presents with axial pain with  possible radicular component. Please assist [place] in identifying specific level(s) and laterality of any additional findings such as: 1. Facet (Zygapophyseal) joint DJD (Hypertrophy, space narrowing, subchondral sclerosis, and/or osteophyte formation) 2. DDD and/or IVDD (Loss of disc height, desiccation, gas patterns, osteophytes, endplate sclerosis, or "Black disc disease") 3. Pars defects 4. Spondylolisthesis, spondylosis, and/or spondyloarthropathies (include Degree/Grade of displacement in mm) (stability) 5. Vertebral body Fractures (acute/chronic) (state percentage of collapse) 6. Demineralization (osteopenia/osteoporotic) 7. Bone pathology 8. Foraminal narrowing  9. Surgical changes    Standing Status:   Future    Standing Expiration Date:   09/11/2023    Scheduling Instructions:     Please make sure that the patient understands that this needs to be done as soon as possible. Never have the patient do the imaging "just before the next appointment". Inform patient that having the imaging done within the [facility] will expedite the availability of the results and will provide      imaging availability to the requesting physician. In addition inform the patient that the imaging order has an expiration date and will not be renewed if not done within  the active period.    Order Specific Question:   Reason for Exam (SYMPTOM  OR DIAGNOSIS REQUIRED)    Answer:   Cervicalgia    Order Specific Question:   Is patient pregnant?    Answer:   No    Order Specific Question:   Preferred imaging location?    Answer:   [facility]    Order Specific Question:   Call Results- Best Contact Number?    Answer:   [phone]   DG Thoracic Spine 4V    Patient presents with axial pain with possible radicular component. Please assist [place] in identifying specific level(s) and laterality of any additional findings such as: 1. Facet (Zygapophyseal) joint DJD (Hypertrophy, space narrowing, subchondral sclerosis,  and/or osteophyte formation) 2. DDD and/or IVDD (Loss of disc height, desiccation, gas patterns, osteophytes, endplate sclerosis, or "Black disc disease") 3. Pars defects 4. Spondylolisthesis, spondylosis, and/or spondyloarthropathies (include Degree/Grade of displacement in mm) (stability) 5. Vertebral body Fractures (acute/chronic) (state percentage of collapse) 6. Demineralization (osteopenia/osteoporotic) 7. Bone pathology 8. Foraminal narrowing  9. Surgical changes    Standing Status:   Future    Standing Expiration Date:   09/11/2023    Scheduling Instructions:     Please make sure that the patient understands that this needs to be done as soon as possible. Never have the patient do the imaging "just before the next appointment". Inform patient that having the imaging done within the [facility] will expedite the availability of the results and will provide      imaging availability to the requesting physician. In addition inform the patient that the imaging order has an expiration date and will not be renewed if not done within the active period.    Order Specific Question:   Reason for Exam (SYMPTOM  OR DIAGNOSIS REQUIRED)    Answer:   Upper back pain and/or thoracic spine pain.    Order Specific Question:   Is patient pregnant?    Answer:   No    Order Specific Question:   Preferred imaging location?    Answer:   [facility]    Order Specific Question:   Call Results- Best Contact Number?    Answer:   [phone] [facility])      Follow-up plan:   Return in about 3 months (around 09/12/2023) for Medication Management, in person.     status post left L3, L4, L5, S1 RFA on 07/06/2019 and right L3, L4, L5, S1 RFA on 07/22/2019-       Recent Visits No visits were found meeting these conditions. Showing recent visits within past 90 days and meeting all other requirements Today's Visits Date Type Provider Dept  06/11/23 Office Visit Edward Jolly, MD [facility]  Showing today's visits and meeting all other requirements Future Appointments No visits were found meeting these conditions. Showing future appointments within next 90 days and meeting all other requirements  I discussed the assessment and treatment plan with the patient. The patient was provided an opportunity to ask questions and all were answered. The patient agreed with the plan and demonstrated an understanding of the instructions.  Patient advised to call back or seek an in-person evaluation if the symptoms or condition worsens.  Duration of encounter: .  Note by: Edward Jolly, MD Date: 06/11/2023; Time: 8:51 AM

## 2023-06-11 NOTE — Progress Notes (Signed)
Nursing Pain Medication Assessment:  Safety precautions to be maintained throughout the outpatient stay will include: orient to surroundings, keep bed in low position, maintain call bell within reach at all times, provide assistance with transfer out of bed and ambulation.  Medication Inspection Compliance: Pill count conducted under aseptic conditions, in front of the patient. Neither the pills nor the bottle was removed from the patient's sight at any time. Once count was completed pills were immediately returned to the patient in their original bottle.  Medication: Hydrocodone/APAP Pill/Patch Count:  21 of 90 pills remain Pill/Patch Appearance: Markings consistent with prescribed medication Bottle Appearance: Standard pharmacy container. Clearly labeled. Filled Date: 5 / 17 / 2024 Last Medication intake:  TodaySafety precautions to be maintained throughout the outpatient stay will include: orient to surroundings, keep bed in low position, maintain call bell within reach at all times, provide assistance with transfer out of bed and ambulation.

## 2023-06-12 ENCOUNTER — Ambulatory Visit
Admission: RE | Admit: 2023-06-12 | Discharge: 2023-06-12 | Disposition: A | Discharge status: Home / Self Care | Payer: BC Managed Care – PPO | Attending: Student in an Organized Health Care Education/Training Program | Admitting: Student in an Organized Health Care Education/Training Program

## 2023-06-12 ENCOUNTER — Ambulatory Visit
Admission: RE | Admit: 2023-06-12 | Discharge: 2023-06-12 | Disposition: A | Discharge status: Home / Self Care | Payer: BC Managed Care – PPO | Source: Ambulatory Visit | Attending: Student in an Organized Health Care Education/Training Program | Admitting: Student in an Organized Health Care Education/Training Program

## 2023-06-12 DIAGNOSIS — M546 Pain in thoracic spine: Secondary | ICD-10-CM | POA: Insufficient documentation

## 2023-06-12 DIAGNOSIS — G8929 Other chronic pain: Secondary | ICD-10-CM | POA: Insufficient documentation

## 2023-06-12 DIAGNOSIS — M542 Cervicalgia: Secondary | ICD-10-CM

## 2023-06-13 ENCOUNTER — Telehealth: Payer: Self-pay | Admitting: Acute Care

## 2023-06-13 NOTE — Telephone Encounter (Signed)
I have called the patient with the results of her low-dose screening CT.  I explained that her scan was read as a lung RADS 4A, 70-month follow-up. This was a 72-month follow-up low-dose CT based on a previous lung RADS 4A reading.  The scan shows Waxing and waning upper and midlung zone predominant centrilobular nodularity, overall progressive from 03/01/2023 and largely new from 02/28/2022. The possibility of an infectious/inflammatory etiology, such as sarcoid, is entertained. Malignancy cannot be excluded. I reviewed the scan with Dr. Jayme Cloud in the [facility] office, and plan will be for her to follow-up with Dr. Jayme Cloud to determine best plan of care moving forward. Patient verbalized understanding and had no further questions at completion of the call. She understands and will would be calling her to get her scheduled Angelique Blonder please fax results to PCP and let them know plan is for follow-up with Dr. Jayme Cloud pulmonary in [place]. Dr. Jayme Cloud please let us know the plan and if we need to return the patient to the screening population.  Thank you

## 2023-06-13 NOTE — Telephone Encounter (Signed)
Results have been sent to PCP with plan. Will keep encounter in nodule pool to assure follow up has been made with Dr. Jayme Cloud.

## 2023-06-13 NOTE — Telephone Encounter (Signed)
Pt has an appt on 6/25 with Dr. Jayme Cloud.

## 2023-06-19 ENCOUNTER — Telehealth: Payer: Self-pay | Admitting: *Deleted

## 2023-06-19 NOTE — Telephone Encounter (Signed)
Results of c-spine x-ray read to patient. She is asking if there are any additional interventions that may be done.

## 2023-06-19 NOTE — Telephone Encounter (Signed)
-----   Message from Edward Jolly, MD sent at 06/19/2023 11:52 AM EDT ----- Regarding: please call pt with xray results  ----- Message ----- From: Interface, Rad Results In Sent: 06/19/2023  11:35 AM EDT To: Edward Jolly, MD

## 2023-06-25 ENCOUNTER — Institutional Professional Consult (permissible substitution): Payer: BC Managed Care – PPO | Admitting: Pulmonary Disease

## 2023-06-26 ENCOUNTER — Ambulatory Visit: Payer: BC Managed Care – PPO | Admitting: Pulmonary Disease

## 2023-06-26 ENCOUNTER — Encounter: Payer: Self-pay | Admitting: Pulmonary Disease

## 2023-06-26 VITALS — BP 126/82 | HR 69 | Temp 97.9°F | Ht 64.0 in | Wt 163.0 lb

## 2023-06-26 DIAGNOSIS — J84115 Respiratory bronchiolitis interstitial lung disease: Secondary | ICD-10-CM | POA: Diagnosis not present

## 2023-06-26 DIAGNOSIS — I517 Cardiomegaly: Secondary | ICD-10-CM | POA: Diagnosis not present

## 2023-06-26 DIAGNOSIS — J449 Chronic obstructive pulmonary disease, unspecified: Secondary | ICD-10-CM | POA: Diagnosis not present

## 2023-06-26 DIAGNOSIS — R918 Other nonspecific abnormal finding of lung field: Secondary | ICD-10-CM

## 2023-06-26 MED ORDER — BUDESONIDE-FORMOTEROL FUMARATE 160-4.5 MCG/ACT IN AERO: 2.0000 | INHALATION_SPRAY | Freq: Two times a day (BID) | RESPIRATORY_TRACT | 11 refills | Status: DC

## 2023-06-26 NOTE — Progress Notes (Unsigned)
Subjective:    Patient ID: Vanessa Greer, female    DOB: [birth year], 62 y.o.   MRN: [id number]  Patient Care Team: Lynnea Ferrier, MD as PCP - General (Internal Medicine) Karie Soda, MD as Consulting Physician (Colon and Rectal Surgery) Christena Deem, MD (Inactive) as Consulting Physician (Gastroenterology) Sharyn Lull, Elvin So, MD as Referring Physician (Dermatology) Schermerhorn, Ihor Austin, MD as Referring Physician (Obstetrics and Gynecology)  Chief Complaint  Patient presents with   Consult    Nodule. CT results. No SOB, wheezing or cough.    HPI Vanessa Greer. Vanessa Greer is a 62 year old with a 41-pack-year history of smoking and a medical history as noted below who presents for evaluation of a mole lung cancer screening CT.The patient is kindly referred by Dr. Curtis Sites III.  The patient had low-dose lung cancer screening CT on 03 June 2023.  This was classified as a lung RADS 4A, suspicious.  It notes centrilobular and paraseptal emphysema, smoking-related respiratory bronchiolitis, waxing and waning centrilobular nodularity in the upper and mid lung zones with overall progression from 01 March 2023.  Some of the findings are largely new from the March CT.  Though the airways were noted to be unremarkable on the formal reading there is actually evidence of retained secretions in the trachea on my independent review.  She also was noted to have cardiomegaly on CT.  I discussed the findings with the patient and showed her the films.  The patient currently does not meet criteria for lung cancer screening due to the myriad findings.  These findings are mostly related to infectious/inflammatory etiology malignancy cannot be excluded.  Given the fact that respiratory bronchiolitis is a consideration I suspect that this is more consistent with RB ILD.  I discussed all of this with the patient.  Not had any weight loss or anorexia, no wheezing or cough.  She does note some shortness of  breath on exertion and fatigue that has been of longstanding.  She is also does note "chest congestion" with occasional rattle in the chest.  No orthopnea or paroxysmal nocturnal dyspnea.  No chest pain.  No lower extremity edema nor calf tenderness.  She has sedentary habits.  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  Has no occupational exposures.  No exotic pets or hobbies.  As noted she has a 41-pack-year history of smoking.  She did smoke up to a pack of cigarettes per day for many years however has cut back to half a pack a day.  Review of Systems A 10 point review of systems was performed and it is as noted above otherwise negative.   Past Medical History:  Diagnosis Date   Anxiety    Back pain    DDD (degenerative disc disease), lumbar    Depression    GERD (gastroesophageal reflux disease)    High cholesterol    Lumbar radiculitis    Pneumothorax 1982   Sleep apnea     Past Surgical History:  Procedure Laterality Date   ABDOMINAL HYSTERECTOMY     COLONOSCOPY     ECTOPIC PREGNANCY SURGERY     ESOPHAGOGASTRODUODENOSCOPY (EGD) WITH PROPOFOL N/A 03/17/2019   Procedure: ESOPHAGOGASTRODUODENOSCOPY (EGD) WITH PROPOFOL;  Surgeon: Christena Deem, MD;  Location: [facility] ENDOSCOPY;  Service: Endoscopy;  Laterality: N/A;   FOOT SURGERY     LASER ABLATION CONDOLAMATA N/A 12/13/2017   Procedure: LASER REMOVAL ABLATION OF CONDYLOMATA, EXAMINATION UNDER ANESTHESIA, HEMORRIDAL LIGATION AND PEXY;  Surgeon:  Karie Soda, MD;  Location: [facility];  Service: General;  Laterality: N/A;  GENERAL AND LOCAL   TRIGGER FINGER RELEASE Right     Patient Active Problem List   Diagnosis Date Noted   Chronic bilateral thoracic back pain 06/11/2023   Cervicalgia 06/11/2023   Bilateral primary osteoarthritis of knee 12/18/2022   Coccyodynia 02/25/2020   Lumbar facet arthropathy 10/02/2018   Lumbar spondylosis 10/02/2018   Lumbar degenerative disc disease 10/02/2018    Chronic pain syndrome 10/02/2018   Anal condylomata s/p laser ablation 12/13/2017 12/13/2017   Prolapsed internal hemorrhoids, grade 3, s/p ligation/pexy 12/13/2017 12/13/2017   Chronic bilateral low back pain with bilateral sciatica 01/29/2017   GERD without esophagitis 01/28/2017    Family History  Problem Relation Age of Onset   Breast cancer Mother 38       recurrance at 20    Social History   Tobacco Use   Smoking status: Every Day    Packs/day: 1.00    Years: 41.00    Additional pack years: 0.00    Total pack years: 41.00    Types: Cigarettes   Smokeless tobacco: Never   Tobacco comments:    0.5 PPD khj 06/26/2023  Substance Use Topics   Alcohol use: Yes    Alcohol/week: 0.0 standard drinks of alcohol    Comment: 3 oz red wine daily    Allergies  Allergen Reactions   Bupropion Anxiety    Current Meds  Medication Sig   budesonide-formoterol (SYMBICORT) 160-4.5 MCG/ACT inhaler Inhale 2 puffs into the lungs 2 (two) times daily.   cyclobenzaprine (FLEXERIL) 5 MG tablet Take 1-2 tablets (5-10 mg total) by mouth daily as needed for muscle spasms.   diclofenac (VOLTAREN) 50 MG EC tablet Take 1 tablet (50 mg total) by mouth 2 (two) times daily after a meal.   furosemide (LASIX) 20 MG tablet Take 20 mg by mouth daily.   HYDROcodone-acetaminophen (NORCO) 10-325 MG tablet Take 1 tablet by mouth every 8 (eight) hours as needed for severe pain. Must last 30 days. For chronic pain.   [START ON 07/18/2023] HYDROcodone-acetaminophen (NORCO) 10-325 MG tablet Take 1 tablet by mouth every 8 (eight) hours as needed for severe pain. Must last 30 days. For chronic pain.   [START ON 08/17/2023] HYDROcodone-acetaminophen (NORCO) 10-325 MG tablet Take 1 tablet by mouth every 8 (eight) hours as needed for severe pain. Must last 30 days. For chronic pain.   LYRICA 150 MG capsule 150 mg 2 (two) times daily.    Multiple Vitamin (MULTIVITAMIN WITH MINERALS) TABS tablet Take 1 tablet by mouth  daily.   pantoprazole (PROTONIX) 40 MG tablet Take 40 mg by mouth 2 (two) times daily.    sertraline (ZOLOFT) 50 MG tablet 100 mg daily.    simvastatin (ZOCOR) 40 MG tablet 40 mg daily at 6 PM.    traZODone (DESYREL) 50 MG tablet Take 100 mg by mouth at bedtime as needed for sleep.     Immunization History  Administered Date(s) Administered   Influenza Inj Mdck Quad Pf 09/23/2019, 11/01/2021   Influenza Split 09/30/2016, 10/29/2017   Influenza,inj,Quad PF,6+ Mos 09/26/2020   Influenza-Unspecified 10/17/2022   Moderna Sars-Covid-2 Vaccination 04/28/2020, 05/19/2020   Pneumococcal Polysaccharide-23 09/04/2018   Td 09/23/2019        Objective:     BP 126/82 (BP Location: Left Arm, Cuff Size: Normal)   Pulse 69   Temp 97.9 F (36.6 C)   Ht 5\' 4"  (1.626 m)  Wt 163 lb (73.9 kg)   SpO2 97%   BMI 27.98 kg/m   SpO2: 97 % O2 Device: None (Room air)  GENERAL: Well-developed, well-nourished woman, no acute distress. HEAD: Normocephalic, atraumatic.  EYES: Pupils equal, round, reactive to light.  No scleral icterus.  MOUTH: Dentition intact, oral mucosa moist.  No thrush.   NECK: Supple. No thyromegaly. Trachea midline. No JVD.  No adenopathy. PULMONARY: Good air entry bilaterally.  Coarse otherwise, no adventitious sounds.  Increased AP diameter CARDIOVASCULAR: S1 and S2. Regular rate and rhythm.  No rubs, murmurs or gallops heard. ABDOMEN: Benign. MUSCULOSKELETAL: No joint deformity, no clubbing, no edema.  NEUROLOGIC: No overt focal deficit, no gait disturbance, speech is fluent. SKIN: Intact,warm,dry. PSYCH: Mood and behavior normal.  CT chest performed 03 June 2023 independently reviewed.   Assessment & Plan:     ICD-10-CM   1. COPD suggested by initial evaluation (HCC)  J44.9 Pulmonary Function Test [facility] Only   Will obtain PFTs Significant emphysema on CT Retained secretions on CT Trial of Symbicort 160/4.5, 2 inhalations twice a day    2. Multiple lung nodules  on CT  R91.8 CT CHEST WO CONTRAST   Suspect respiratory bronchiolitis Suspect patient developing ILD associated with this Repeat CT chest 3 months    3. Cardiomegaly  I51.7 ECHOCARDIOGRAM COMPLETE   Will obtain echocardiogram to further evaluate    4. Respiratory bronchiolitis interstitial lung disease (HCC)  J84.115 Pulmonary Function Test [facility] Only   Suspect this is etiology of patient's current findings on CT      Orders Placed This Encounter  Procedures   CT CHEST WO CONTRAST    In 3 months    Standing Status:   Future    Standing Expiration Date:   06/25/2024    Order Specific Question:   Preferred imaging location?    Answer:   [facility]    Order Specific Question:   Is patient pregnant?    Answer:   No   Pulmonary Function Test [facility] Only    Standing Status:   Future    Standing Expiration Date:   06/25/2024    Order Specific Question:   Full PFT: includes the following: basic spirometry, spirometry pre & post bronchodilator, diffusion capacity (DLCO), lung volumes    Answer:   Full PFT    Order Specific Question:   This test can only be performed at    Answer:   [facility]   ECHOCARDIOGRAM COMPLETE    Standing Status:   Future    Standing Expiration Date:   06/25/2024    Order Specific Question:   Where should this test be performed    Answer:   [facility]    Order Specific Question:   Perflutren DEFINITY (image enhancing agent) should be administered unless hypersensitivity or allergy exist    Answer:   Administer Perflutren    Order Specific Question:   Reason for exam-Echo    Answer:   Cardiomegaly  I51.7    Meds ordered this encounter  Medications   budesonide-formoterol (SYMBICORT) 160-4.5 MCG/ACT inhaler    Sig: Inhale 2 puffs into the lungs 2 (two) times daily.    Dispense:  10.2 g    Refill:  11   Will see the patient in follow-up in 6 to 8 weeks time.  She is to call sooner should any new problems arise.Gailen Shelter,  MD Advanced Bronchoscopy PCC[facility]    *This note  was dictated using voice recognition software/Dragon.  Despite best efforts to proofread, errors can occur which can change the meaning. Any transcriptional errors that result from this process are unintentional and may not be fully corrected at the time of dictation.

## 2023-06-26 NOTE — Patient Instructions (Signed)
We are ordering some tests to include a breathing test and a heart test.  We will repeat your chest CT in 3 months.  I am giving you a trial of an inhaler called Symbicort that is 2 puffs twice a day to see if that can help with your congestion in the chest.  We will see him in follow-up in 6 to 8 weeks time call sooner should any new problems arise.

## 2023-06-27 ENCOUNTER — Encounter: Payer: Self-pay | Admitting: Pulmonary Disease

## 2023-07-17 ENCOUNTER — Ambulatory Visit
Admission: RE | Admit: 2023-07-17 | Discharge: 2023-07-17 | Disposition: A | Discharge status: Home / Self Care | Payer: BC Managed Care – PPO | Source: Ambulatory Visit | Attending: Pulmonary Disease | Admitting: Pulmonary Disease

## 2023-07-17 DIAGNOSIS — I083 Combined rheumatic disorders of mitral, aortic and tricuspid valves: Secondary | ICD-10-CM | POA: Insufficient documentation

## 2023-07-17 DIAGNOSIS — I3139 Other pericardial effusion (noninflammatory): Secondary | ICD-10-CM | POA: Insufficient documentation

## 2023-07-17 DIAGNOSIS — I517 Cardiomegaly: Secondary | ICD-10-CM | POA: Diagnosis not present

## 2023-07-17 DIAGNOSIS — I5030 Unspecified diastolic (congestive) heart failure: Secondary | ICD-10-CM

## 2023-07-17 LAB — ECHOCARDIOGRAM COMPLETE
AR max vel: 2.28 cm2
AV Area VTI: 2.45 cm2
AV Area mean vel: 2.48 cm2
AV Mean grad: 5 mmHg
AV Peak grad: 10.2 mmHg
Ao pk vel: 1.6 m/s
Area-P 1/2: 3.95 cm2
Calc EF: 58.9 %
MV VTI: 2.48 cm2
S' Lateral: 4.2 cm
Single Plane A2C EF: 61 %
Single Plane A4C EF: 53.9 %

## 2023-07-17 NOTE — Progress Notes (Signed)
*  PRELIMINARY RESULTS* Echocardiogram 2D Echocardiogram has been performed.  Vanessa Greer 07/17/2023, 9:55 AM

## 2023-07-18 ENCOUNTER — Other Ambulatory Visit: Payer: Self-pay

## 2023-07-18 DIAGNOSIS — I3139 Other pericardial effusion (noninflammatory): Secondary | ICD-10-CM

## 2023-07-19 ENCOUNTER — Telehealth: Payer: Self-pay | Admitting: Pulmonary Disease

## 2023-07-19 NOTE — Telephone Encounter (Signed)
Synetta Fail called patient to schedule PFT. Called transferred to [place].

## 2023-07-19 NOTE — Telephone Encounter (Signed)
Patient is returning phone call. Patient phone number is [phone].

## 2023-08-05 ENCOUNTER — Other Ambulatory Visit: Payer: Self-pay | Admitting: Student in an Organized Health Care Education/Training Program

## 2023-08-06 ENCOUNTER — Ambulatory Visit: Payer: BC Managed Care – PPO | Attending: Pulmonary Disease

## 2023-08-06 DIAGNOSIS — Z79899 Other long term (current) drug therapy: Secondary | ICD-10-CM | POA: Diagnosis present

## 2023-08-06 DIAGNOSIS — J84115 Respiratory bronchiolitis interstitial lung disease: Secondary | ICD-10-CM | POA: Insufficient documentation

## 2023-08-06 DIAGNOSIS — J449 Chronic obstructive pulmonary disease, unspecified: Secondary | ICD-10-CM | POA: Diagnosis not present

## 2023-08-06 LAB — PULMONARY FUNCTION TEST ARMC ONLY
DL/VA % pred: 85 %
DL/VA: 3.59 ml/min/mmHg/L
DLCO unc % pred: 86 %
DLCO unc: 17.37 ml/min/mmHg
FEF 25-75 Post: 2.46 L/sec
FEF 25-75 Pre: 2.05 L/sec
FEF2575-%Change-Post: 19 %
FEF2575-%Pred-Post: 106 %
FEF2575-%Pred-Pre: 89 %
FEV1-%Change-Post: 2 %
FEV1-%Pred-Post: 91 %
FEV1-%Pred-Pre: 89 %
FEV1-Post: 2.32 L
FEV1-Pre: 2.27 L
FEV1FVC-%Change-Post: 2 %
FEV1FVC-%Pred-Pre: 103 %
FEV6-%Change-Post: 0 %
FEV6-%Pred-Post: 88 %
FEV6-%Pred-Pre: 88 %
FEV6-Post: 2.8 L
FEV6-Pre: 2.81 L
FEV6FVC-%Pred-Post: 103 %
FEV6FVC-%Pred-Pre: 103 %
FVC-%Change-Post: 0 %
FVC-%Pred-Post: 85 %
FVC-%Pred-Pre: 85 %
FVC-Post: 2.8 L
FVC-Pre: 2.81 L
Post FEV1/FVC ratio: 83 %
Post FEV6/FVC ratio: 100 %
Pre FEV1/FVC ratio: 81 %
Pre FEV6/FVC Ratio: 100 %
RV % pred: 88 %
RV: 1.78 L
TLC % pred: 93 %
TLC: 4.74 L

## 2023-08-06 MED ORDER — ALBUTEROL SULFATE (2.5 MG/3ML) 0.083% IN NEBU
2.5000 mg | INHALATION_SOLUTION | Freq: Once | RESPIRATORY_TRACT | Status: AC
  Administered 2023-08-06: 2.5 mg via RESPIRATORY_TRACT
  Filled 2023-08-06: qty 3

## 2023-08-20 ENCOUNTER — Ambulatory Visit: Payer: BC Managed Care – PPO | Admitting: Pulmonary Disease

## 2023-09-03 ENCOUNTER — Ambulatory Visit
Payer: BC Managed Care – PPO | Attending: Student in an Organized Health Care Education/Training Program | Admitting: Student in an Organized Health Care Education/Training Program

## 2023-09-03 ENCOUNTER — Encounter: Payer: Self-pay | Admitting: Student in an Organized Health Care Education/Training Program

## 2023-09-03 ENCOUNTER — Ambulatory Visit: Payer: BC Managed Care – PPO | Admitting: Pulmonary Disease

## 2023-09-03 VITALS — BP 117/67 | HR 61 | Temp 97.2°F | Resp 16 | Ht 64.0 in | Wt 155.0 lb

## 2023-09-03 DIAGNOSIS — G8929 Other chronic pain: Secondary | ICD-10-CM | POA: Insufficient documentation

## 2023-09-03 DIAGNOSIS — G894 Chronic pain syndrome: Secondary | ICD-10-CM | POA: Insufficient documentation

## 2023-09-03 DIAGNOSIS — M5136 Other intervertebral disc degeneration, lumbar region: Secondary | ICD-10-CM | POA: Insufficient documentation

## 2023-09-03 DIAGNOSIS — M542 Cervicalgia: Secondary | ICD-10-CM | POA: Diagnosis not present

## 2023-09-03 DIAGNOSIS — M546 Pain in thoracic spine: Secondary | ICD-10-CM | POA: Diagnosis present

## 2023-09-03 MED ORDER — BELBUCA 300 MCG BU FILM: 1.0000 | ORAL_FILM | Freq: Two times a day (BID) | BUCCAL | 0 refills | Status: DC

## 2023-09-03 MED ORDER — BELBUCA 450 MCG BU FILM: 1.0000 | ORAL_FILM | Freq: Two times a day (BID) | BUCCAL | 0 refills | Status: DC

## 2023-09-03 MED ORDER — HYDROCODONE-ACETAMINOPHEN 10-325 MG PO TABS: 1.0000 | ORAL_TABLET | Freq: Three times a day (TID) | ORAL | 0 refills | Status: DC | PRN

## 2023-09-03 NOTE — Progress Notes (Signed)
PROVIDER NOTE: Information contained herein reflects review and annotations entered in association with encounter. Interpretation of such information and data should be left to medically-trained personnel. Information provided to patient can be located elsewhere in the medical record under "Patient Instructions". Document created using STT-dictation technology, any transcriptional errors that may result from process are unintentional.    Patient: Vanessa Greer  Service Category: E/M  Provider: Edward Jolly, MD  DOB: Dec 07, 1961  DOS: 09/03/2023  Specialty: Interventional Pain Management  MRN: [id number]  Setting: Ambulatory outpatient  PCP: Lynnea Ferrier, MD  Type: Established Patient    Referring Provider: Lynnea Ferrier, MD  Location: Office  Delivery: Face-to-face     HPI  [name], a 62 y.o. year old female, is here today because of her Chronic bilateral thoracic back pain [M54.6, G89.29]. [name] primary complain today is Back Pain  Last encounter: My last encounter with her was on 06/11/23  Pertinent problems: [name] has Chronic bilateral low back pain with bilateral sciatica; Lumbar facet arthropathy; Lumbar spondylosis; Lumbar degenerative disc disease; Chronic pain syndrome; and Coccyodynia on their pertinent problem list. Pain Assessment: Severity of Chronic pain is reported as a 0-No pain/10. Location: Back Lower, Right, Left/Radiates from lower back into hips bilateral. Onset: More than a month ago. Quality: Aching, Burning. Timing: Constant. Modifying factor(s): Medication and heat. Vitals:  height is 5\' 4"  (1.626 m) and weight is 155 lb (70.3 kg). Her temporal temperature is 97.2 F (36.2 C) (abnormal). Her blood pressure is 117/67 and her pulse is 61. Her respiration is 16 and oxygen saturation is 99%.   Reason for encounter: medication management.    [name] follows up today for medication management.   At her last visit, patient was endorsing  increased cervical and thoracic spine pain.  It was musculoskeletal in nature.  Her thoracic spine x-ray does reveal mild thoracic degenerative disc disease which could certainly be the source of her pain.  We discussed heat and massage to this region. She states that her hydrocodone has become less effective.  We discussed buprenorphine for pain management.  She has tried [employer] patch in the past which was effective for her pain but she was dealing with side effects of skin irritation and rash.  We discussed belbuca as an alternative.   Pharmacotherapy Assessment  Analgesic: Norco 10 mg TID prn--> wean down as we start Belbuca 300 mcg bid then 450 mcg bid as potential titration schedule   Monitoring: [place] PMP: PDMP not reviewed this encounter.       Pharmacotherapy: No side-effects or adverse reactions reported. Compliance: No problems identified. Effectiveness: Clinically acceptable.  Vanessa Iba, RN  09/03/2023  8:16 AM  Sign when Signing Visit Nursing Pain Medication Assessment:  Safety precautions to be maintained throughout the outpatient stay will include: orient to surroundings, keep bed in low position, maintain call bell within reach at all times, provide assistance with transfer out of bed and ambulation.  Medication Inspection Compliance: Pill count conducted under aseptic conditions, in front of the patient. Neither the pills nor the bottle was removed from the patient's sight at any time. Once count was completed pills were immediately returned to the patient in their original bottle.  Medication: Hydrocodone/APAP Pill/Patch Count:  38.5 of 90 pills remain Pill/Patch Appearance: Markings consistent with prescribed medication Bottle Appearance: Standard pharmacy container. Clearly labeled. Filled Date: 08 / 17 / 2024 Last Medication intake:  Today    UDS:  Summary  Date Value Ref Range Status  09/13/2022 Note  Final    Comment:     ==================================================================== ToxASSURE Select 13 (MW) ==================================================================== Specimen Alert Note: Urinary creatinine is very low; ability to detect some drugs may be compromised; creatinine-normalized drug concentrations should be interpreted with caution. Suggest recollection. (Creatinine) ==================================================================== Test                             Result       Flag       Units  Drug Present and Declared for Prescription Verification   Hydrocodone                    1971         EXPECTED   ng/mg creat   Norhydrocodone                 5229         EXPECTED   ng/mg creat    Sources of hydrocodone include scheduled prescription medications.    Norhydrocodone is an expected metabolite of hydrocodone.  ==================================================================== Test                      Result    Flag   Units      Ref Range   Creatinine              7         LL     mg/dL      >=16 ==================================================================== Declared Medications:  The flagging and interpretation on this report are based on the  following declared medications.  Unexpected results may arise from  inaccuracies in the declared medications.   **Note: The testing scope of this panel includes these medications:   Hydrocodone (Norco)   **Note: The testing scope of this panel does not include the  following reported medications:   Acetaminophen (Norco)  Cyclobenzaprine (Flexeril)  Furosemide (Lasix)  Multivitamin  Pantoprazole (Protonix)  Pregabalin (Lyrica)  Sertraline (Zoloft)  Simvastatin (Zocor)  Supplement  Trazodone (Desyrel) ==================================================================== For clinical consultation, please call [phone]. ====================================================================      ROS   Constitutional: Denies any fever or chills Gastrointestinal: No reported hemesis, hematochezia, vomiting, or acute GI distress Musculoskeletal:  cervical and thoracic pain Neurological: No reported episodes of acute onset apraxia, aphasia, dysarthria, agnosia, amnesia, paralysis, loss of coordination, or loss of consciousness  Medication Review  Buprenorphine HCl, HYDROcodone-acetaminophen, budesonide-formoterol, cyclobenzaprine, diclofenac, furosemide, multivitamin with minerals, pantoprazole, pregabalin, sertraline, simvastatin, and traZODone  History Review  Allergy: Ms. Adwell is allergic to bupropion. Drug: Ms. Sharp  reports no history of drug use. Alcohol:  reports current alcohol use. Tobacco:  reports that she has been smoking cigarettes. She has a 41 pack-year smoking history. She has never used smokeless tobacco. Social: Ms. Sura  reports that she has been smoking cigarettes. She has a 41 pack-year smoking history. She has never used smokeless tobacco. She reports current alcohol use. She reports that she does not use drugs. Medical:  has a past medical history of Anxiety, Back pain, DDD (degenerative disc disease), lumbar, Depression, GERD (gastroesophageal reflux disease), High cholesterol, Lumbar radiculitis, Pneumothorax (1982), and Sleep apnea. Surgical: Ms. Resch  has a past surgical history that includes Abdominal hysterectomy; Foot surgery; Ectopic pregnancy surgery; Colonoscopy; Laser ablation condolamata (N/A, 12/13/2017); Esophagogastroduodenoscopy (egd) with propofol (N/A, 03/17/2019); and Trigger finger release (Right). Family: family history includes Breast cancer (  age of onset: 13) in her mother.  Laboratory Chemistry Profile   Renal No results found for: "BUN", "CREATININE", "LABCREA", "BCR", "GFR", "GFRAA", "GFRNONAA", "LABVMA", "EPIRU", "EPINEPH24HUR", "NOREPRU", "NOREPI24HUR", "DOPARU", "DOPAM24HRUR"  Hepatic No results found for: "AST", "ALT", "ALBUMIN",  "ALKPHOS", "HCVAB", "AMYLASE", "LIPASE", "AMMONIA"  Electrolytes No results found for: "NA", "K", "CL", "CALCIUM", "MG", "PHOS"  Bone No results found for: "VD25OH", "VD125OH2TOT", "ZO1096EA5", "WU9811BJ4", "25OHVITD1", "25OHVITD2", "25OHVITD3", "TESTOFREE", "TESTOSTERONE"  Inflammation (CRP: Acute Phase) (ESR: Chronic Phase) No results found for: "CRP", "ESRSEDRATE", "LATICACIDVEN"       Note: Above Lab results reviewed.  Recent Imaging Review  ECHOCARDIOGRAM COMPLETE    ECHOCARDIOGRAM REPORT       Patient Name:   SYLIVA ROSSON Date of Exam: 07/17/2023 Medical Rec #:  [id number]       Height:       64.0 in Accession #:    [id number]      Weight:       163.0 lb Date of Birth:  1961/08/25        BSA:          1.793 m Patient Age:    62 years        BP:           126/82 mmHg Patient Gender: F               HR:           61 bpm. Exam Location:  [facility]  Procedure: 2D Echo, Cardiac Doppler, Color Doppler and Strain Analysis  Indications:     Cardiomegaly   History:         Patient has no prior history of Echocardiogram examinations.                  Cardiomegaly.   Sonographer:     Mikki Harbor Referring Phys:  [id number] CARMEN Knox Saliva Diagnosing Phys: Debbe Odea MD    Sonographer Comments: Global longitudinal strain was attempted. IMPRESSIONS   1. Left ventricular ejection fraction, by estimation, is 55 to 60%. The left ventricle has normal function. The left ventricle has no regional wall motion abnormalities. Left ventricular diastolic parameters are consistent with Grade I diastolic  dysfunction (impaired relaxation). The average left ventricular global longitudinal strain is -16.5 %. The global longitudinal strain is normal.  2. Right ventricular systolic function is normal. The right ventricular size is normal. There is normal pulmonary artery systolic pressure.  3. Small to moderate pericardial effusion.. The pericardial effusion is anterior to the right ventricle.  There is no evidence of cardiac tamponade.  4. The mitral valve is normal in structure. Mild mitral valve regurgitation.  5. The aortic valve is tricuspid. Aortic valve regurgitation is not visualized.  6. The inferior vena cava is normal in size with greater than 50% respiratory variability, suggesting right atrial pressure of 3 mmHg.  FINDINGS  Left Ventricle: Left ventricular ejection fraction, by estimation, is 55 to 60%. The left ventricle has normal function. The left ventricle has no regional wall motion abnormalities. The average left ventricular global longitudinal strain is -16.5 %.  The global longitudinal strain is normal. The left ventricular internal cavity size was normal in size. There is no left ventricular hypertrophy. Left ventricular diastolic parameters are consistent with Grade I diastolic dysfunction (impaired  relaxation).  Right Ventricle: The right ventricular size is normal. No increase in right ventricular wall thickness. Right ventricular systolic function is normal. There is normal pulmonary artery systolic pressure. The  tricuspid regurgitant velocity is 1.76 m/s, and  with an assumed right atrial pressure of 3 mmHg, the estimated right ventricular systolic pressure is 15.4 mmHg.  Left Atrium: Left atrial size was normal in size.  Right Atrium: Right atrial size was normal in size.  Pericardium: Small to moderate pericardial effusion. The pericardial effusion is anterior to the right ventricle. There is no evidence of cardiac tamponade.  Mitral Valve: The mitral valve is normal in structure. Mild mitral valve regurgitation. MV peak gradient, 7.0 mmHg. The mean mitral valve gradient is 2.0 mmHg.  Tricuspid Valve: The tricuspid valve is normal in structure. Tricuspid valve regurgitation is mild.  Aortic Valve: The aortic valve is tricuspid. Aortic valve regurgitation is not visualized. Aortic valve mean gradient measures 5.0 mmHg. Aortic valve peak gradient measures  10.2 mmHg. Aortic valve area, by VTI measures 2.45 cm.  Pulmonic Valve: The pulmonic valve was normal in structure. Pulmonic valve regurgitation is not visualized.  Aorta: The aortic root and ascending aorta are structurally normal, with no evidence of dilitation.  Venous: The inferior vena cava is normal in size with greater than 50% respiratory variability, suggesting right atrial pressure of 3 mmHg.  IAS/Shunts: No atrial level shunt detected by color flow Doppler.    LEFT VENTRICLE PLAX 2D LVIDd:         5.70 cm     Diastology LVIDs:         4.20 cm     LV e' medial:    7.40 cm/s LV PW:         1.00 cm     LV E/e' medial:  12.4 LV IVS:        1.00 cm     LV e' lateral:   7.51 cm/s LVOT diam:     2.00 cm     LV E/e' lateral: 12.2 LV SV:         97 LV SV Index:   54          2D Longitudinal Strain LVOT Area:     3.14 cm    2D Strain GLS Avg:     -16.5 %   LV Volumes (MOD) LV vol d, MOD A2C: 78.3 ml LV vol d, MOD A4C: 57.0 ml LV vol s, MOD A2C: 30.5 ml LV vol s, MOD A4C: 26.3 ml LV SV MOD A2C:     47.8 ml LV SV MOD A4C:     57.0 ml LV SV MOD BP:      40.8 ml  RIGHT VENTRICLE RV Basal diam:  3.10 cm RV Mid diam:    2.80 cm RV S prime:     9.90 cm/s TAPSE (M-mode): 2.2 cm  LEFT ATRIUM             Index        RIGHT ATRIUM           Index LA diam:        4.00 cm 2.23 cm/m   RA Area:     15.60 cm LA Vol (A2C):   55.4 ml 30.89 ml/m  RA Volume:   40.30 ml  22.47 ml/m LA Vol (A4C):   50.7 ml 28.27 ml/m LA Biplane Vol: 53.0 ml 29.55 ml/m  AORTIC VALVE                     PULMONIC VALVE AV Area (Vmax):    2.28 cm      PV Vmax:  1.04 m/s AV Area (Vmean):   2.48 cm      PV Peak grad:  4.3 mmHg AV Area (VTI):     2.45 cm AV Vmax:           160.00 cm/s AV Vmean:          102.000 cm/s AV VTI:            0.396 m AV Peak Grad:      10.2 mmHg AV Mean Grad:      5.0 mmHg LVOT Vmax:         116.00 cm/s LVOT Vmean:        80.600 cm/s LVOT VTI:          0.309  m LVOT/AV VTI ratio: 0.78   AORTA Ao Root diam: 3.20 cm Ao Asc diam:  2.70 cm  MITRAL VALVE                TRICUSPID VALVE MV Area (PHT): 3.95 cm     TR Peak grad:   12.4 mmHg MV Area VTI:   2.48 cm     TR Vmax:        176.00 cm/s MV Peak grad:  7.0 mmHg MV Mean grad:  2.0 mmHg     SHUNTS MV Vmax:       1.32 m/s     Systemic VTI:  0.31 m MV Vmean:      66.8 cm/s    Systemic Diam: 2.00 cm MV Decel Time: 192 msec MV E velocity: 91.70 cm/s MV A velocity: 108.00 cm/s MV E/A ratio:  0.85  Debbe Odea MD Electronically signed by Debbe Odea MD Signature Date/Time: 07/17/2023/12:40:54 PM      Final    Note: Reviewed        Physical Exam  General appearance: Well nourished, well developed, and well hydrated. In no apparent acute distress Mental status: Alert, oriented x 3 (person, place, & time)       Respiratory: No evidence of acute respiratory distress Eyes: PERLA Vitals: BP 117/67   Pulse 61   Temp (!) 97.2 F (36.2 C) (Temporal)   Resp 16   Ht 5\' 4"  (1.626 m)   Wt 155 lb (70.3 kg)   SpO2 99%   BMI 26.61 kg/m  BMI: Estimated body mass index is 26.61 kg/m as calculated from the following:   Height as of this encounter: 5\' 4"  (1.626 m).   Weight as of this encounter: 155 lb (70.3 kg). Ideal: Ideal body weight: 54.7 kg (120 lb 9.5 oz) Adjusted ideal body weight: 60.9 kg (134 lb 5.7 oz)  Cervical and thoracic pain   Bilateral knee pain, worse with weightbearing  Assessment   Status Diagnosis  Controlled Controlled Controlled 1. Chronic bilateral thoracic back pain   2. Cervicalgia   3. Lumbar degenerative disc disease   4. Chronic pain syndrome          Plan of Care   Ms. JEWELL AMBRIZ has a current medication list which includes the following long-term medication(s): budesonide-formoterol, furosemide, lyrica, sertraline, simvastatin, trazodone, and hydrocodone-acetaminophen.  Pharmacotherapy (Medications Ordered): Meds ordered this  encounter  Medications   HYDROcodone-acetaminophen (NORCO) 10-325 MG tablet    Sig: Take 1 tablet by mouth every 8 (eight) hours as needed for severe pain. Must last 30 days. For chronic pain.    Dispense:  90 tablet    Refill:  0    [place] STOP ACT - Not applicable. Fill one day  early if pharmacy is closed on scheduled refill date.   Buprenorphine HCl (BELBUCA) 300 MCG FILM    Sig: Place 1 Film inside cheek every 12 (twelve) hours.    Dispense:  60 each    Refill:  0   Buprenorphine HCl (BELBUCA) 450 MCG FILM    Sig: Place 1 Film (450 mcg total) inside cheek every 12 (twelve) hours.    Dispense:  60 each    Refill:  0   Orders Placed This Encounter  Procedures   ToxASSURE Select 13 (MW), Urine    Volume: 30 ml(s). Minimum 3 ml of urine is needed. Document temperature of fresh sample. Indications: Long term (current) use of opiate analgesic [phone])    Order Specific Question:   Release to patient    Answer:   Immediate    Follow-up plan:   Return in about 6 weeks (around 10/15/2023) for Medication Management, in person.     status post left L3, L4, L5, S1 RFA on 07/06/2019 and right L3, L4, L5, S1 RFA on 07/22/2019-       Recent Visits Date Type Provider Dept  06/11/23 Office Visit Edward Jolly, MD [facility]  Showing recent visits within past 90 days and meeting all other requirements Today's Visits Date Type Provider Dept  09/03/23 Office Visit Edward Jolly, MD [facility]  Showing today's visits and meeting all other requirements Future Appointments Date Type Provider Dept  10/15/23 Appointment Edward Jolly, MD [facility]  Showing future appointments within next 90 days and meeting all other requirements  I discussed the assessment and treatment plan with the patient. The patient was provided an opportunity to ask questions and all were answered. The patient agreed with the plan and demonstrated an understanding of the  instructions.  Patient advised to call back or seek an in-person evaluation if the symptoms or condition worsens.  Duration of encounter: .  Note by: Edward Jolly, MD Date: 09/03/2023; Time: 9:27 AM

## 2023-09-03 NOTE — Patient Instructions (Signed)
Medication Rules  Applies to: All patients receiving prescriptions (written or electronic).  Pharmacy of record: Pharmacy where electronic prescriptions will be sent. If written prescriptions are taken to a different pharmacy, please inform the nursing staff. The pharmacy listed in the electronic medical record should be the one where you would like electronic prescriptions to be sent.  Prescription refills: Only during scheduled appointments. Applies to both, written and electronic prescriptions.  NOTE: The following applies primarily to controlled substances (Opioid* Pain Medications).   Patient's responsibilities: Pain Pills: Bring all pain pills to every appointment (except for procedure appointments). Pill Bottles: Bring pills in original pharmacy bottle. Always bring newest bottle. Bring bottle, even if empty. Medication refills: You are responsible for knowing and keeping track of what medications you need refilled. The day before your appointment, write a list of all prescriptions that need to be refilled. Bring that list to your appointment and give it to the admitting nurse. Prescriptions will be written only during appointments. If you forget a medication, it will not be "Called in", "Faxed", or "electronically sent". You will need to get another appointment to get these prescribed. Prescription Accuracy: You are responsible for carefully inspecting your prescriptions before leaving our office. Have the discharge nurse carefully go over each prescription with you, before taking them home. Make sure that your name is accurately spelled, that your address is correct. Check the name and dose of your medication to make sure it is accurate. Check the number of pills, and the written instructions to make sure they are clear and accurate. Make sure that you are given enough medication to last until your next medication refill appointment. Taking Medication: Take medication as prescribed. Never  take more pills than instructed. Never take medication more frequently than prescribed. Taking less pills or less frequently is permitted and encouraged, when it comes to controlled substances (written prescriptions).  Inform other Doctors: Always inform, all of your healthcare providers, of all the medications you take. Pain Medication from other Providers: You are not allowed to accept any additional pain medication from any other Doctor or Healthcare provider. There are two exceptions to this rule. (see below) In the event that you require additional pain medication, you are responsible for notifying us, as stated below. Medication Agreement: You are responsible for carefully reading and following our Medication Agreement. This must be signed before receiving any prescriptions from our practice. Safely store a copy of your signed Agreement. Violations to the Agreement will result in no further prescriptions. (Additional copies of our Medication Agreement are available upon request.) Laws, Rules, & Regulations: All patients are expected to follow all Federal and State Laws, Statutes, Rules, & Regulations. Ignorance of the Laws does not constitute a valid excuse. The use of any illegal substances is prohibited. Adopted CDC guidelines & recommendations: Target dosing levels will be at or below 60 MME/day. Use of benzodiazepines** is not recommended.  Exceptions: There are only two exceptions to the rule of not receiving pain medications from other Healthcare Providers. Exception #1 (Emergencies): In the event of an emergency (i.e.: accident requiring emergency care), you are allowed to receive additional pain medication. However, you are responsible for: As soon as you are able, call our office [phone], at any time of the day or night, and leave a message stating your name, the date and nature of the emergency, and the name and dose of the medication prescribed. In the event that your call is answered  by a member of   our staff, make sure to document and save the date, time, and the name of the person that took your information.  Exception #2 (Planned Surgery): In the event that you are scheduled by another doctor or dentist to have any type of surgery or procedure, you are allowed (for a period no longer than 30 days), to receive additional pain medication, for the acute post-op pain. However, in this case, you are responsible for picking up a copy of our "Post-op Pain Management for Surgeons" handout, and giving it to your surgeon or dentist. This document is available at our office, and does not require an appointment to obtain it. Simply go to our office during business hours (Monday-Thursday from 8:00 AM to 4:00 PM) (Friday 8:00 AM to 12:00 Noon) or if you have a scheduled appointment with us, prior to your surgery, and ask for it by name. In addition, you will need to provide us with your name, name of your surgeon, type of surgery, and date of procedure or surgery.  *Opioid medications include: morphine, codeine, oxycodone, oxymorphone, hydrocodone, hydromorphone, meperidine, tramadol, tapentadol, buprenorphine, fentanyl, methadone. **Benzodiazepine medications include: diazepam (Valium), alprazolam (Xanax), clonazepam (Klonopine), lorazepam (Ativan), clorazepate (Tranxene), chlordiazepoxide (Librium), estazolam (Prosom), oxazepam (Serax), temazepam (Restoril), triazolam (Halcion) (Last updated: 02/27/2018)  

## 2023-09-03 NOTE — Progress Notes (Signed)
Nursing Pain Medication Assessment:  Safety precautions to be maintained throughout the outpatient stay will include: orient to surroundings, keep bed in low position, maintain call bell within reach at all times, provide assistance with transfer out of bed and ambulation.  Medication Inspection Compliance: Pill count conducted under aseptic conditions, in front of the patient. Neither the pills nor the bottle was removed from the patient's sight at any time. Once count was completed pills were immediately returned to the patient in their original bottle.  Medication: Hydrocodone/APAP Pill/Patch Count:  38.5 of 90 pills remain Pill/Patch Appearance: Markings consistent with prescribed medication Bottle Appearance: Standard pharmacy container. Clearly labeled. Filled Date: 08 / 17 / 2024 Last Medication intake:  Today

## 2023-09-07 LAB — TOXASSURE SELECT 13 (MW), URINE

## 2023-09-13 ENCOUNTER — Other Ambulatory Visit: Payer: Self-pay | Admitting: Pulmonary Disease

## 2023-09-13 ENCOUNTER — Ambulatory Visit
Admission: RE | Admit: 2023-09-13 | Discharge: 2023-09-13 | Disposition: A | Discharge status: Home / Self Care | Payer: BC Managed Care – PPO | Source: Ambulatory Visit | Attending: Internal Medicine | Admitting: Internal Medicine

## 2023-09-13 DIAGNOSIS — R918 Other nonspecific abnormal finding of lung field: Secondary | ICD-10-CM | POA: Diagnosis present

## 2023-09-13 DIAGNOSIS — J84115 Respiratory bronchiolitis interstitial lung disease: Secondary | ICD-10-CM

## 2023-09-13 DIAGNOSIS — J449 Chronic obstructive pulmonary disease, unspecified: Secondary | ICD-10-CM

## 2023-09-13 DIAGNOSIS — I517 Cardiomegaly: Secondary | ICD-10-CM

## 2023-09-19 ENCOUNTER — Ambulatory Visit: Payer: BC Managed Care – PPO | Admitting: Pulmonary Disease

## 2023-09-19 ENCOUNTER — Encounter: Payer: Self-pay | Admitting: Student in an Organized Health Care Education/Training Program

## 2023-09-30 ENCOUNTER — Other Ambulatory Visit: Payer: Self-pay

## 2023-09-30 DIAGNOSIS — R918 Other nonspecific abnormal finding of lung field: Secondary | ICD-10-CM

## 2023-10-04 ENCOUNTER — Ambulatory Visit: Payer: BC Managed Care – PPO | Attending: Cardiology | Admitting: Cardiology

## 2023-10-04 ENCOUNTER — Encounter: Payer: Self-pay | Admitting: Cardiology

## 2023-10-04 VITALS — BP 112/68 | HR 62 | Ht 64.0 in | Wt 160.0 lb

## 2023-10-04 DIAGNOSIS — I3139 Other pericardial effusion (noninflammatory): Secondary | ICD-10-CM | POA: Diagnosis not present

## 2023-10-04 DIAGNOSIS — F17200 Nicotine dependence, unspecified, uncomplicated: Secondary | ICD-10-CM

## 2023-10-04 NOTE — Progress Notes (Signed)
Cardiology Office Note:    Date:  10/04/2023   ID:  [name], DOB [birth year], MRN [id number]  PCP:  Lynnea Ferrier, MD   [facility] Providers Cardiologist:  Debbe Odea, MD     Referring MD: Lynnea Ferrier, MD   Chief Complaint  Patient presents with   New Patient (Initial Visit)    Referred for cardiac evaluation of pericardium effusion on recent echo.  Cardiac history with [facility] with last visit in 2020.     Vanessa Greer is a 62 y.o. female who is being seen today for the evaluation of pericardial effusion at the request of Lynnea Ferrier, MD.   History of Present Illness:    Vanessa Greer is a 62 y.o. female with a hx of COPD, current smoker x 40+ years presenting due to pericardial effusion.  Patient has a pulmonary nodule, had a follow-up chest CT 06/03/2023 showing small pericardial effusion.  Also complains of leg edema, followed up with PCP, started on Lasix 20 mg daily with improvement.  Echocardiogram was obtained on 7/24 showing mild to moderate pericardial effusion, no evidence for tamponade, normal EF 55-60.  She still smokes.  Denies chest pain, breathing is okay, no concerns at this time.  Past Medical History:  Diagnosis Date   Anxiety    Back pain    DDD (degenerative disc disease), lumbar    Depression    GERD (gastroesophageal reflux disease)    High cholesterol    Lumbar radiculitis    Pneumothorax 1982   Sleep apnea     Past Surgical History:  Procedure Laterality Date   ABDOMINAL HYSTERECTOMY     COLONOSCOPY     ECTOPIC PREGNANCY SURGERY     ESOPHAGOGASTRODUODENOSCOPY (EGD) WITH PROPOFOL N/A 03/17/2019   Procedure: ESOPHAGOGASTRODUODENOSCOPY (EGD) WITH PROPOFOL;  Surgeon: Christena Deem, MD;  Location: [facility] ENDOSCOPY;  Service: Endoscopy;  Laterality: N/A;   FOOT SURGERY     LASER ABLATION CONDOLAMATA N/A 12/13/2017   Procedure: LASER REMOVAL ABLATION OF CONDYLOMATA, EXAMINATION UNDER  ANESTHESIA, HEMORRIDAL LIGATION AND PEXY;  Surgeon: Karie Soda, MD;  Location: [facility];  Service: General;  Laterality: N/A;  GENERAL AND LOCAL   TRIGGER FINGER RELEASE Right     Current Medications: Current Meds  Medication Sig   budesonide-formoterol (SYMBICORT) 160-4.5 MCG/ACT inhaler Inhale 2 puffs into the lungs 2 (two) times daily.   cyclobenzaprine (FLEXERIL) 5 MG tablet Take 1-2 tablets (5-10 mg total) by mouth daily as needed for muscle spasms.   furosemide (LASIX) 20 MG tablet Take 20 mg by mouth daily.   HYDROcodone-acetaminophen (NORCO) 10-325 MG tablet Take 1 tablet by mouth every 8 (eight) hours as needed for severe pain. Must last 30 days. For chronic pain.     Allergies:   Bupropion   Social History   Socioeconomic History   Marital status: Married    Spouse name: Not on file   Number of children: Not on file   Years of education: Not on file   Highest education level: Not on file  Occupational History   Not on file  Tobacco Use   Smoking status: Every Day    Current packs/day: 1.00    Average packs/day: 1 pack/day for 41.0 years (41.0 ttl pk-yrs)    Types: Cigarettes   Smokeless tobacco: Never   Tobacco comments:    0.5 PPD khj 06/26/2023  Vaping Use   Vaping status: Never Used  Substance and Sexual Activity   Alcohol use: Yes    Alcohol/week: 0.0 standard drinks of alcohol    Comment: 3 oz red wine daily   Drug use: No   Sexual activity: Not on file  Other Topics Concern   Not on file  Social History Narrative   Not on file   Social Determinants of Health   Financial Resource Strain: Not on file  Food Insecurity: Not on file  Transportation Needs: Not on file  Physical Activity: Not on file  Stress: Not on file  Social Connections: Not on file     Family History: The patient's family history includes Breast cancer (age of onset: 60) in her mother; Heart disease in her father.  ROS:   Please see the history of present  illness.     All other systems reviewed and are negative.  EKGs/Labs/Other Studies Reviewed:    The following studies were reviewed today:  EKG Interpretation Date/Time:  Friday October 04 2023 09:27:54 EDT Ventricular Rate:  62 PR Interval:  186 QRS Duration:  84 QT Interval:  420 QTC Calculation: 426 R Axis:   33  Text Interpretation: Normal sinus rhythm Low voltage QRS Confirmed by Debbe Odea ([id number]) on 10/04/2023 9:35:08 AM    Recent Labs: No results found for requested labs within last 365 days.  Recent Lipid Panel No results found for: "CHOL", "TRIG", "HDL", "CHOLHDL", "VLDL", "LDLCALC", "LDLDIRECT"   Risk Assessment/Calculations:             Physical Exam:    VS:  BP 112/68 (BP Location: Left Arm, Patient Position: Sitting, Cuff Size: Normal)   Pulse 62   Ht 5\' 4"  (1.626 m)   Wt 160 lb (72.6 kg)   SpO2 98%   BMI 27.46 kg/m     Wt Readings from Last 3 Encounters:  10/04/23 160 lb (72.6 kg)  09/03/23 155 lb (70.3 kg)  06/26/23 163 lb (73.9 kg)     GEN:  Well nourished, well developed in no acute distress HEENT: Normal NECK: No JVD; No carotid bruits CARDIAC: RRR, no murmurs, rubs, gallops RESPIRATORY:  Clear to auscultation without rales, wheezing or rhonchi  ABDOMEN: Soft, non-tender, non-distended MUSCULOSKELETAL:  No edema; No deformity  SKIN: Warm and dry NEUROLOGIC:  Alert and oriented x 3 PSYCHIATRIC:  Normal affect   ASSESSMENT:    1. Pericardial effusion   2. Smoking    PLAN:    In order of problems listed above:  Mild to moderate pericardial effusion.  No evidence for tamponade.  Lasix 20 mg daily okay.  Echo with normal EF.  Clinically asymptomatic.  Repeat echo in 5 months. Current smoker, smoking cessation advised.  Follow-up in 6 months.     Medication Adjustments/Labs and Tests Ordered: Current medicines are reviewed at length with the patient today.  Concerns regarding medicines are outlined above.  Orders Placed This  Encounter  Procedures   EKG 12-Lead   ECHOCARDIOGRAM COMPLETE   No orders of the defined types were placed in this encounter.   Patient Instructions  Medication Instructions:  Your physician recommends that you continue on your current medications as directed. Please refer to the Current Medication list given to you today.  *If you need a refill on your cardiac medications before your next appointment, please call your pharmacy*  Lab Work: -None ordered  Testing/Procedures: Your physician has requested that you have an echocardiogram. Echocardiography is a painless test that uses sound waves to create images of your  heart. It provides your doctor with information about the size and shape of your heart and how well your heart's chambers and valves are working. This procedure takes approximately one hour. There are no restrictions for this procedure. Please do NOT wear cologne, perfume, aftershave, or lotions (deodorant is allowed). Please arrive 15 minutes prior to your appointment time.   Follow-Up: At [facility], you and your health needs are our priority.  As part of our continuing mission to provide you with exceptional heart care, we have created designated Provider Care Teams.  These Care Teams include your primary Cardiologist (physician) and Advanced Practice Providers (APPs -  Physician Assistants and Nurse Practitioners) who all work together to provide you with the care you need, when you need it.  Your next appointment:   6 month(s)  Provider:   You may see Debbe Odea, MD or one of the following Advanced Practice Providers on your designated Care Team:   Nicolasa Ducking, NP Eula Listen, PA-C Cadence Fransico Michael, PA-C Charlsie Quest, NP    Other Instructions -Schedule echocardiogram for 4 months out    Signed, Debbe Odea, MD  10/04/2023 11:07 AM    [facility]

## 2023-10-04 NOTE — Patient Instructions (Addendum)
Medication Instructions:  Your physician recommends that you continue on your current medications as directed. Please refer to the Current Medication list given to you today.  *If you need a refill on your cardiac medications before your next appointment, please call your pharmacy*  Lab Work: -None ordered  Testing/Procedures: Your physician has requested that you have an echocardiogram. Echocardiography is a painless test that uses sound waves to create images of your heart. It provides your doctor with information about the size and shape of your heart and how well your heart's chambers and valves are working. This procedure takes approximately one hour. There are no restrictions for this procedure. Please do NOT wear cologne, perfume, aftershave, or lotions (deodorant is allowed). Please arrive 15 minutes prior to your appointment time.   Follow-Up: At [facility], you and your health needs are our priority.  As part of our continuing mission to provide you with exceptional heart care, we have created designated Provider Care Teams.  These Care Teams include your primary Cardiologist (physician) and Advanced Practice Providers (APPs -  Physician Assistants and Nurse Practitioners) who all work together to provide you with the care you need, when you need it.  Your next appointment:   6 month(s)  Provider:   You may see Debbe Odea, MD or one of the following Advanced Practice Providers on your designated Care Team:   Nicolasa Ducking, NP Eula Listen, PA-C Cadence Fransico Michael, PA-C Charlsie Quest, NP    Other Instructions -Schedule echocardiogram for 4 months out

## 2023-10-08 ENCOUNTER — Ambulatory Visit: Payer: BC Managed Care – PPO | Admitting: Pulmonary Disease

## 2023-10-08 ENCOUNTER — Encounter: Payer: Self-pay | Admitting: Pulmonary Disease

## 2023-10-08 VITALS — BP 110/78 | HR 71 | Temp 98.4°F | Ht 64.0 in | Wt 159.0 lb

## 2023-10-08 DIAGNOSIS — R918 Other nonspecific abnormal finding of lung field: Secondary | ICD-10-CM

## 2023-10-08 DIAGNOSIS — J84115 Respiratory bronchiolitis interstitial lung disease: Secondary | ICD-10-CM

## 2023-10-08 DIAGNOSIS — Z23 Encounter for immunization: Secondary | ICD-10-CM | POA: Diagnosis not present

## 2023-10-08 DIAGNOSIS — J449 Chronic obstructive pulmonary disease, unspecified: Secondary | ICD-10-CM

## 2023-10-08 DIAGNOSIS — F17210 Nicotine dependence, cigarettes, uncomplicated: Secondary | ICD-10-CM

## 2023-10-08 NOTE — Progress Notes (Signed)
Subjective:    Patient ID: Vanessa Greer, female    DOB: Sep 13, 1961, 62 y.o.   MRN: [id number]  Patient Care Team: Lynnea Ferrier, MD as PCP - General (Internal Medicine) Debbe Odea, MD as PCP - Cardiology (Cardiology) Karie Soda, MD as Consulting Physician (Colon and Rectal Surgery) Christena Deem, MD (Inactive) as Consulting Physician (Gastroenterology) Sharyn Lull, Elvin So, MD as Referring Physician (Dermatology) Schermerhorn, Ihor Austin, MD as Referring Physician (Obstetrics and Gynecology)  Chief Complaint  Patient presents with   Follow-up    Doing good. No SOB, wheezing or cough.     HPI Vanessa Greer is a 62 year old with a 41-pack-year history of smoking and a medical history as noted below who presents for follow-up of normal lung cancer screening CT. I initially saw the patient on 26 June 2023 for the details of that visit please refer to that note.  Recall that the patient had low-dose lung cancer screening CT on 03 June 2023.  This was classified as a lung RADS 4A, suspicious.  It notes centrilobular and paraseptal emphysema, smoking-related respiratory bronchiolitis, waxing and waning centrilobular nodularity in the upper and mid lung zones with overall progression from 01 March 2023.  Some of the findings are largely new from the March CT. she had follow-up CT on 13 September 2023 and this showed significant waxing and waning of pulmonary nodularity with the largest new nodule being 5.5 mm in the left upper lobe.  The findings are more likely due to "smokers bronchiolitis"/RB ILD.  The new scan has been categorized as lung RADS 3.  The patient will need follow-up in 6 months time.  It is also noted that as long as she quit smoking she may need to be pulled from the lung cancer screening program due to the myriad of findings.  She has not had any weight loss or anorexia, no wheezing or cough.  She does note some shortness of breath on exertion and fatigue that  has been of longstanding.  She is also does note "chest congestion" with occasional rattle in the chest.  No orthopnea or paroxysmal nocturnal dyspnea.  No chest pain.  No lower extremity edema nor calf tenderness.  She is on Symbicort twice a day which was started during her initial visit, she notes that this helps her.  She continues to smoke half a pack of cigarettes per day.  Notes that is very difficult for her to quit due to having failed many methods before.  These included Chantix, nicotine gum, nicotine lozenges.  She did fairly well with nicotine cartridges previously but these are no longer made.  She does not want patches because of her brother having a prior bad experience with the patches.  She does not want to "risk it".  She may try decreasing gradually.   DATA 06/03/2023 LDCT chest: Waxing and waning upper and midlung zone predominant centrilobular nodularity progressive from 01 March 2023 and largely new from 28 February 2022.  Possibility of infectious/inflammatory etiology such as sarcoid is entertained.  Malignancy cannot be excluded.  Exam is categorized as lung RADS 4A 07/17/2023 echocardiogram: LVEF 55 to 60%, LV normal function.  Grade 1 DD.  RV systolic function normal.  Normal pulmonary artery systolic pressure.  Small to moderate pericardial effusion, no evidence of cardiac tamponade.  Mild mitral regurgitation. 08/06/2023 PFTs: FEV1 2.27 L or 89% predicted, FVC 2.81 L or 85% predicted, FEV1 FVC 81%, lung volumes normal, diffusion capacity normal, overall, normal  study. 09/13/2023 LDCT chest: Significant waxing and waning pulmonary nodularity with the largest nodule measuring 5.5 mm in the upper lobe.  Findings likely due to smoking-related respiratory bronchiolitis.  Examination is categorized as lung RADS 3, short-term follow-up in 6 months recommended.  Emphysema also noted.  Review of Systems A 10 point review of systems was performed and it is as noted above otherwise  negative.   Patient Active Problem List   Diagnosis Date Noted   Chronic bilateral thoracic back pain 06/11/2023   Cervicalgia 06/11/2023   Bilateral primary osteoarthritis of knee 12/18/2022   Coccyodynia 02/25/2020   Lumbar facet arthropathy 10/02/2018   Lumbar spondylosis 10/02/2018   Lumbar degenerative disc disease 10/02/2018   Chronic pain syndrome 10/02/2018   Anal condylomata s/p laser ablation 12/13/2017 12/13/2017   Prolapsed internal hemorrhoids, grade 3, s/p ligation/pexy 12/13/2017 12/13/2017   Chronic bilateral low back pain with bilateral sciatica 01/29/2017   GERD without esophagitis 01/28/2017    Social History   Tobacco Use   Smoking status: Every Day    Current packs/day: 1.00    Average packs/day: 1 pack/day for 41.0 years (41.0 ttl pk-yrs)    Types: Cigarettes   Smokeless tobacco: Never   Tobacco comments:    0.5 PPD 10/08/2023 khj  Substance Use Topics   Alcohol use: Not Currently    Allergies  Allergen Reactions   Bupropion Anxiety    Current Meds  Medication Sig   budesonide-formoterol (SYMBICORT) 160-4.5 MCG/ACT inhaler Inhale 2 puffs into the lungs 2 (two) times daily.   cyclobenzaprine (FLEXERIL) 5 MG tablet Take 1-2 tablets (5-10 mg total) by mouth daily as needed for muscle spasms.   furosemide (LASIX) 20 MG tablet Take 20 mg by mouth daily.   LYRICA 150 MG capsule 150 mg 2 (two) times daily.    Multiple Vitamin (MULTIVITAMIN WITH MINERALS) TABS tablet Take 1 tablet by mouth daily.   pantoprazole (PROTONIX) 40 MG tablet Take 40 mg by mouth 2 (two) times daily.    sertraline (ZOLOFT) 50 MG tablet 100 mg daily.    simvastatin (ZOCOR) 40 MG tablet 40 mg daily at 6 PM.    traZODone (DESYREL) 50 MG tablet Take 100 mg by mouth at bedtime as needed for sleep.     Immunization History  Administered Date(s) Administered   Influenza Inj Mdck Quad Pf 09/23/2019, 11/01/2021   Influenza Split 09/30/2016, 10/29/2017   Influenza,inj,Quad PF,6+ Mos  09/26/2020   Influenza-Unspecified 10/17/2022   Moderna Sars-Covid-2 Vaccination 04/28/2020, 05/19/2020   Pneumococcal Polysaccharide-23 09/04/2018   Td 09/23/2019        Objective:   BP 110/78 (BP Location: Right Arm, Cuff Size: Normal)   Pulse 71   Temp 98.4 F (36.9 C)   Ht 5\' 4"  (1.626 m)   Wt 159 lb (72.1 kg)   SpO2 97%   BMI 27.29 kg/m   SpO2: 97 % O2 Device: None (Room air)  GENERAL: Well-developed, well-nourished woman, no acute distress. HEAD: Normocephalic, atraumatic.  EYES: Pupils equal, round, reactive to light.  No scleral icterus.  MOUTH: Dentition intact, oral mucosa moist.  No thrush.   NECK: Supple. No thyromegaly. Trachea midline. No JVD.  No adenopathy. PULMONARY: Good air entry bilaterally.  Coarse otherwise, no adventitious sounds.  Increased AP diameter CARDIOVASCULAR: S1 and S2. Regular rate and rhythm.  No rubs, murmurs or gallops heard. ABDOMEN: Benign. MUSCULOSKELETAL: No joint deformity, no clubbing, no edema.  NEUROLOGIC: No overt focal deficit, no gait disturbance, speech is fluent.  SKIN: Intact,warm,dry. PSYCH: Mood and behavior normal.  CT chest from 13th September was reviewed independently, also reviewed with the patient.  Assessment & Plan:     ICD-10-CM   1. Stage 1 mild COPD by GOLD classification (HCC)  J44.9    Continue Symbicort Encouraged smoking cessation    2. Respiratory bronchiolitis interstitial lung disease (HCC)  J84.115    Management as with smoking cessation This was made clear to the patient    3. Lung nodules  R91.8    These are related to respiratory bronchiolitis Repeat CT chest 6 months    4. Tobacco dependence due to cigarettes  F17.210    Patient counseled with regards to discontinuation of smoking Total counseling time 3 to 5 minutes    5. Need for influenza vaccination  Z23 Flu vaccine trivalent PF, 6mos and older(Flulaval,Afluria,Fluarix,Fluzone)   Patient received flu vaccine today      Orders  Placed This Encounter  Procedures   Flu vaccine trivalent PF, 6mos and older(Flulaval,Afluria,Fluarix,Fluzone)    Will see the patient in follow-up in 4 months time she is to call sooner should any new problems arise.  Gailen Shelter, MD Advanced Bronchoscopy PCCM [facility]    *This note was dictated using voice recognition software/Dragon.  Despite best efforts to proofread, errors can occur which can change the meaning. Any transcriptional errors that result from this process are unintentional and may not be fully corrected at the time of dictation.

## 2023-10-08 NOTE — Patient Instructions (Signed)
We discussed alternatives to quit smoking today.  We discussed that the issues in the lung are managed by quitting smoking.  You have what is call "smokers bronchiolitis".  You received your flu vaccine today.  You will have a follow-up CT through the lung cancer screening program in approximately 6 months.  They will call you closer to that time of the test.  We will see you in follow-up in 4 months time call sooner should any problems arise.

## 2023-10-15 ENCOUNTER — Ambulatory Visit
Payer: BC Managed Care – PPO | Attending: Student in an Organized Health Care Education/Training Program | Admitting: Student in an Organized Health Care Education/Training Program

## 2023-10-15 ENCOUNTER — Encounter: Payer: Self-pay | Admitting: Student in an Organized Health Care Education/Training Program

## 2023-10-15 VITALS — BP 142/74 | HR 62 | Temp 97.5°F | Resp 16 | Ht 64.0 in | Wt 158.0 lb

## 2023-10-15 DIAGNOSIS — M542 Cervicalgia: Secondary | ICD-10-CM | POA: Diagnosis not present

## 2023-10-15 DIAGNOSIS — M546 Pain in thoracic spine: Secondary | ICD-10-CM | POA: Insufficient documentation

## 2023-10-15 DIAGNOSIS — M5136 Other intervertebral disc degeneration, lumbar region with discogenic back pain only: Secondary | ICD-10-CM | POA: Diagnosis not present

## 2023-10-15 DIAGNOSIS — G8929 Other chronic pain: Secondary | ICD-10-CM | POA: Diagnosis present

## 2023-10-15 DIAGNOSIS — G894 Chronic pain syndrome: Secondary | ICD-10-CM | POA: Diagnosis present

## 2023-10-15 MED ORDER — HYDROCODONE-ACETAMINOPHEN 10-325 MG PO TABS: 1.0000 | ORAL_TABLET | Freq: Three times a day (TID) | ORAL | 0 refills | Status: DC | PRN

## 2023-10-15 NOTE — Progress Notes (Signed)
Nursing Pain Medication Assessment:  Safety precautions to be maintained throughout the outpatient stay will include: orient to surroundings, keep bed in low position, maintain call bell within reach at all times, provide assistance with transfer out of bed and ambulation.  Medication Inspection Compliance: Pill count conducted under aseptic conditions, in front of the patient. Neither the pills nor the bottle was removed from the patient's sight at any time. Once count was completed pills were immediately returned to the patient in their original bottle.  Medication: Hydrocodone/APAP Pill/Patch Count:  8 of 90 pills remain Pill/Patch Appearance: Markings consistent with prescribed medication Bottle Appearance: Standard pharmacy container. Clearly labeled. Filled Date: 7 / 17 / 2024 Last Medication intake:  Today

## 2023-10-15 NOTE — Progress Notes (Signed)
PROVIDER NOTE: Information contained herein reflects review and annotations entered in association with encounter. Interpretation of such information and data should be left to medically-trained personnel. Information provided to patient can be located elsewhere in the medical record under "Patient Instructions". Document created using STT-dictation technology, any transcriptional errors that may result from process are unintentional.    Patient: Vanessa Greer  Service Category: E/M  Provider: Edward Jolly, MD  DOB: Sep 13, 1961  DOS: 10/15/2023  Specialty: Interventional Pain Management  MRN: [id number]  Setting: Ambulatory outpatient  PCP: Lynnea Ferrier, MD  Type: Established Patient    Referring Provider: Lynnea Ferrier, MD  Location: Office  Delivery: Face-to-face     HPI  [name], a 62 y.o. year old female, is here today because of her Chronic bilateral thoracic back pain [M54.6, G89.29]. [name] primary complain today is Back Pain  Last encounter: My last encounter with her was on 09/03/23  Pertinent problems: [name] has Chronic bilateral low back pain with bilateral sciatica; Lumbar facet arthropathy; Lumbar spondylosis; Lumbar degenerative disc disease; Chronic pain syndrome; and Coccyodynia on their pertinent problem list. Pain Assessment: Severity of Chronic pain is reported as a 0-No pain/10. Location: Back Lower/denies. Onset: More than a month ago. Quality: Aching, Burning. Timing: Constant (unless taking meds). Modifying factor(s): meds, heat. Vitals:  height is 5\' 4"  (1.626 m) and weight is 158 lb (71.7 kg). Her temperature is 97.5 F (36.4 C) (abnormal). Her blood pressure is 142/74 (abnormal) and her pulse is 62. Her respiration is 16 and oxygen saturation is 100%.   Reason for encounter: medication management.    No change in medical history since last visit.  Patient's pain is at baseline.  Patient continues multimodal pain regimen as prescribed.   States that it provides pain relief and improvement in functional status. Decided not to start Belbuca after reviews that she read Previous RFA did help somewhat but patient not interested in repeating, states that she has many out of pocket healthcare costs related to pulmonary and cardiology appts    Pharmacotherapy Assessment  Analgesic:Norco 10 mg TID prn  Monitoring: [place] PMP: PDMP reviewed during this encounter.       Pharmacotherapy: No side-effects or adverse reactions reported. Compliance: No problems identified. Effectiveness: Clinically acceptable.  Nonah Mattes, RN  10/15/2023  8:04 AM  Sign when Signing Visit Nursing Pain Medication Assessment:  Safety precautions to be maintained throughout the outpatient stay will include: orient to surroundings, keep bed in low position, maintain call bell within reach at all times, provide assistance with transfer out of bed and ambulation.  Medication Inspection Compliance: Pill count conducted under aseptic conditions, in front of the patient. Neither the pills nor the bottle was removed from the patient's sight at any time. Once count was completed pills were immediately returned to the patient in their original bottle.  Medication: Hydrocodone/APAP Pill/Patch Count:  8 of 90 pills remain Pill/Patch Appearance: Markings consistent with prescribed medication Bottle Appearance: Standard pharmacy container. Clearly labeled. Filled Date: 73 / 17 / 2024 Last Medication intake:  Today    UDS:  Summary  Date Value Ref Range Status  09/03/2023 Note  Final    Comment:    ==================================================================== [facility] (MW) ==================================================================== Specimen Alert Note: Urinary creatinine is low; ability to detect some drugs may be compromised. Interpret results with caution.  (Creatinine) ==================================================================== Test  Result       Flag       Units  Drug Present and Declared for Prescription Verification   Hydrocodone                    2605         EXPECTED   ng/mg creat   Hydromorphone                  695          EXPECTED   ng/mg creat   Dihydrocodeine                 400          EXPECTED   ng/mg creat   Norhydrocodone                 8000         EXPECTED   ng/mg creat    Sources of hydrocodone include scheduled prescription medications.    Hydromorphone, dihydrocodeine and norhydrocodone are expected    metabolites of hydrocodone. Hydromorphone and dihydrocodeine are    also available as scheduled prescription medications.  Drug Absent but Declared for Prescription Verification   Buprenorphine                  Not Detected UNEXPECTED ng/mg creat    Low dose buprenorphine is not always detected even when used as    directed.  ==================================================================== Test                      Result    Flag   Units      Ref Range   Creatinine              19        LL     mg/dL      >=53 ==================================================================== Declared Medications:  The flagging and interpretation on this report are based on the  following declared medications.  Unexpected results may arise from  inaccuracies in the declared medications.   **Note: The testing scope of this panel includes these medications:   Hydrocodone (Norco)   **Note: The testing scope of this panel does not include small to  moderate amounts of these reported medications:   Buprenorphine (Belbuca)   **Note: The testing scope of this panel does not include the  following reported medications:   Acetaminophen (Norco)  Budesonide (Symbicort)  Cyclobenzaprine (Flexeril)  Diclofenac (Voltaren)  Formoterol (Symbicort)  Furosemide (Lasix)  Multivitamin   Pantoprazole (Protonix)  Pregabalin (Lyrica)  Sertraline (Zoloft)  Simvastatin (Zocor)  Trazodone (Desyrel) ==================================================================== For clinical consultation, please call [phone]. ====================================================================      ROS  Constitutional: Denies any fever or chills Gastrointestinal: No reported hemesis, hematochezia, vomiting, or acute GI distress Musculoskeletal:  cervical and thoracic pain Neurological: No reported episodes of acute onset apraxia, aphasia, dysarthria, agnosia, amnesia, paralysis, loss of coordination, or loss of consciousness  Medication Review  HYDROcodone-acetaminophen, budesonide-formoterol, cyclobenzaprine, furosemide, multivitamin with minerals, pantoprazole, pregabalin, sertraline, simvastatin, and traZODone  History Review  Allergy: [name] is allergic to bupropion. Drug: [name]  reports no history of drug use. Alcohol:  reports that she does not currently use alcohol. Tobacco:  reports that she has been smoking cigarettes. She has a 41 pack-year smoking history. She has never used smokeless tobacco. Social: [name]  reports that she has been smoking cigarettes. She has a 41 pack-year smoking history. She has never  used smokeless tobacco. She reports that she does not currently use alcohol. She reports that she does not use drugs. Medical:  has a past medical history of Anxiety, Back pain, DDD (degenerative disc disease), lumbar, Depression, GERD (gastroesophageal reflux disease), High cholesterol, Lumbar radiculitis, Pneumothorax (1982), and Sleep apnea. Surgical: [name]  has a past surgical history that includes Abdominal hysterectomy; Foot surgery; Ectopic pregnancy surgery; Colonoscopy; Laser ablation condolamata (N/A, 12/13/2017); Esophagogastroduodenoscopy (egd) with propofol (N/A, 03/17/2019); and Trigger finger release (Right). Family: family history  includes Breast cancer (age of onset: 57) in her mother; Heart disease in her father.  Laboratory Chemistry Profile   Renal No results found for: "BUN", "CREATININE", "LABCREA", "BCR", "GFR", "GFRAA", "GFRNONAA", "LABVMA", "EPIRU", "EPINEPH24HUR", "NOREPRU", "NOREPI24HUR", "DOPARU", "DOPAM24HRUR"  Hepatic No results found for: "AST", "ALT", "ALBUMIN", "ALKPHOS", "HCVAB", "AMYLASE", "LIPASE", "AMMONIA"  Electrolytes No results found for: "NA", "K", "CL", "CALCIUM", "MG", "PHOS"  Bone No results found for: "VD25OH", "VD125OH2TOT", "ZO1096EA5", "[id number]", "25OHVITD1", "25OHVITD2", "25OHVITD3", "TESTOFREE", "TESTOSTERONE"  Inflammation (CRP: Acute Phase) (ESR: Chronic Phase) No results found for: "CRP", "ESRSEDRATE", "LATICACIDVEN"       Note: Above Lab results reviewed.  Recent Imaging Review  CT CHEST LCS NODULE F/U LOW DOSE WO CONTRAST CLINICAL DATA:  Abnormal lung cancer screening CT. Current smoker, 38 pack-year history.  EXAM: CT CHEST WITHOUT CONTRAST FOR LUNG CANCER SCREENING NODULE FOLLOW-UP  TECHNIQUE: Multidetector CT imaging of the chest was performed following the standard protocol without IV contrast.  RADIATION DOSE REDUCTION: This exam was performed according to the departmental dose-optimization program which includes automated exposure control, adjustment of the mA and/or kV according to patient size and/or use of iterative reconstruction technique.  COMPARISON:  06/03/2023, 03/01/2023 and 02/28/2022.  FINDINGS: Cardiovascular: Atherosclerotic calcification of the aorta. Heart is enlarged. No pericardial effusion.  Mediastinum/Nodes: No pathologically enlarged mediastinal or axillary lymph nodes. Hilar regions are difficult to definitively evaluate without IV contrast. Esophagus is grossly unremarkable.  Lungs/Pleura: Centrilobular and paraseptal emphysema. Smoking related respiratory bronchiolitis. Significant waxing and waning pulmonary nodularity with  multiple resolved pulmonary nodules and several new pulmonary nodules, measuring up to 2 5.5 mm in the apical left upper lobe (3/65). No pleural fluid. Airway is unremarkable.  Upper Abdomen: Visualized portions of the liver and gallbladder are unremarkable. 2.1 cm right adrenal nodule measures 0 Hounsfield units. No follow-up necessary. Visualized portions of the left adrenal gland, kidneys, spleen, pancreas, stomach and bowel are grossly unremarkable. No upper abdominal adenopathy.  Musculoskeletal: Mild degenerative changes in the spine.  IMPRESSION: 1. Significant waxing and waning pulmonary nodularity with the largest new nodule measuring 5.5 mm in the left upper lobe. Findings are likely due to smoking related respiratory bronchiolitis. Patient may be a poor candidate for lung cancer screening while smoking. By size criteria, examination is categorized as Lung-RADS 3, probably benign findings. Short-term follow-up in 6 months is recommended with repeat low-dose chest CT without contrast (please use the following order, "CT CHEST LCS NODULE FOLLOW-UP W/O CM"). 2. Right adrenal adenoma. 3.  Emphysema (ICD10-J43.9). 4.  Aortic atherosclerosis (ICD10-I70.0).  Electronically Signed   By: Leanna Battles M.D.   On: 09/30/2023 08:58  Note: Reviewed        Physical Exam  General appearance: Well nourished, well developed, and well hydrated. In no apparent acute distress Mental status: Alert, oriented x 3 (person, place, & time)       Respiratory: No evidence of acute respiratory distress Eyes: PERLA Vitals: BP (!) 142/74   Pulse 62  Temp (!) 97.5 F (36.4 C)   Resp 16   Ht 5\' 4"  (1.626 m)   Wt 158 lb (71.7 kg)   SpO2 100%   BMI 27.12 kg/m  BMI: Estimated body mass index is 27.12 kg/m as calculated from the following:   Height as of this encounter: 5\' 4"  (1.626 m).   Weight as of this encounter: 158 lb (71.7 kg). Ideal: Ideal body weight: 54.7 kg (120 lb 9.5  oz) Adjusted ideal body weight: 61.5 kg (135 lb 8.9 oz)  Cervical and thoracic pain   Bilateral knee pain, worse with weightbearing  Assessment   Status Diagnosis  Controlled Controlled Controlled 1. Chronic bilateral thoracic back pain   2. Cervicalgia   3. Degeneration of intervertebral disc of lumbar region with discogenic back pain   4. Chronic pain syndrome           Plan of Care   [name] has a current medication list which includes the following long-term medication(s): budesonide-formoterol, furosemide, lyrica, sertraline, simvastatin, trazodone, [START ON 10/17/2023] hydrocodone-acetaminophen, [START ON 11/16/2023] hydrocodone-acetaminophen, and [START ON 12/16/2023] hydrocodone-acetaminophen.  Pharmacotherapy (Medications Ordered): Meds ordered this encounter  Medications   HYDROcodone-acetaminophen (NORCO) 10-325 MG tablet    Sig: Take 1 tablet by mouth every 8 (eight) hours as needed for severe pain (pain score 7-10). Must last 30 days. For chronic pain.    Dispense:  90 tablet    Refill:  0    [facility] STOP ACT - Not applicable. Fill one day early if pharmacy is closed on scheduled refill date.   HYDROcodone-acetaminophen (NORCO) 10-325 MG tablet    Sig: Take 1 tablet by mouth every 8 (eight) hours as needed for severe pain (pain score 7-10). Must last 30 days. For chronic pain.    Dispense:  90 tablet    Refill:  0    [facility] STOP ACT - Not applicable. Fill one day early if pharmacy is closed on scheduled refill date.   HYDROcodone-acetaminophen (NORCO) 10-325 MG tablet    Sig: Take 1 tablet by mouth every 8 (eight) hours as needed for severe pain (pain score 7-10). Must last 30 days. For chronic pain.    Dispense:  90 tablet    Refill:  0    [place] STOP ACT - Not applicable. Fill one day early if pharmacy is closed on scheduled refill date.   Continue Lyrica as prescribed  No orders of the defined types were placed in this encounter.   Follow-up plan:    Return in about 3 months (around 01/15/2024) for MM, F2F.     status post left L3, L4, L5, S1 RFA on 07/06/2019 and right L3, L4, L5, S1 RFA on 07/22/2019-       Recent Visits Date Type Provider Dept  09/03/23 Office Visit Edward Jolly, MD [facility]  Showing recent visits within past 90 days and meeting all other requirements Today's Visits Date Type Provider Dept  10/15/23 Office Visit Edward Jolly, MD [facility]  Showing today's visits and meeting all other requirements Future Appointments No visits were found meeting these conditions. Showing future appointments within next 90 days and meeting all other requirements  I discussed the assessment and treatment plan with the patient. The patient was provided an opportunity to ask questions and all were answered. The patient agreed with the plan and demonstrated an understanding of the instructions.  Patient advised to call back or seek an in-person evaluation if the symptoms or  condition worsens.  Duration of encounter: .  Note by: Edward Jolly, MD Date: 10/15/2023; Time: 8:24 AM

## 2023-10-29 ENCOUNTER — Ambulatory Visit: Payer: BC Managed Care – PPO | Admitting: Pulmonary Disease

## 2023-12-18 DIAGNOSIS — I3139 Other pericardial effusion (noninflammatory): Secondary | ICD-10-CM | POA: Insufficient documentation

## 2024-01-14 ENCOUNTER — Encounter: Payer: Self-pay | Admitting: Student in an Organized Health Care Education/Training Program

## 2024-01-14 ENCOUNTER — Ambulatory Visit
Payer: 59 | Attending: Student in an Organized Health Care Education/Training Program | Admitting: Student in an Organized Health Care Education/Training Program

## 2024-01-14 VITALS — BP 108/60 | HR 63 | Temp 97.4°F | Ht 64.0 in | Wt 158.0 lb

## 2024-01-14 DIAGNOSIS — G8929 Other chronic pain: Secondary | ICD-10-CM | POA: Insufficient documentation

## 2024-01-14 DIAGNOSIS — M5136 Other intervertebral disc degeneration, lumbar region with discogenic back pain only: Secondary | ICD-10-CM | POA: Insufficient documentation

## 2024-01-14 DIAGNOSIS — M542 Cervicalgia: Secondary | ICD-10-CM | POA: Insufficient documentation

## 2024-01-14 DIAGNOSIS — M51362 Other intervertebral disc degeneration, lumbar region with discogenic back pain and lower extremity pain: Secondary | ICD-10-CM | POA: Diagnosis present

## 2024-01-14 DIAGNOSIS — G894 Chronic pain syndrome: Secondary | ICD-10-CM | POA: Diagnosis present

## 2024-01-14 DIAGNOSIS — M546 Pain in thoracic spine: Secondary | ICD-10-CM | POA: Insufficient documentation

## 2024-01-14 MED ORDER — HYDROCODONE-ACETAMINOPHEN 10-325 MG PO TABS: 1.0000 | ORAL_TABLET | Freq: Three times a day (TID) | ORAL | 0 refills | Status: DC | PRN

## 2024-01-14 MED ORDER — CELECOXIB 100 MG PO CAPS
100.0000 mg | ORAL_CAPSULE | Freq: Two times a day (BID) | ORAL | 2 refills | Status: AC
Start: 2024-01-14 — End: 2024-02-28

## 2024-01-14 NOTE — Progress Notes (Signed)
 PROVIDER NOTE: Information contained herein reflects review and annotations entered in association with encounter. Interpretation of such information and data should be left to medically-trained personnel. Information provided to patient can be located elsewhere in the medical record under Patient Instructions. Document created using STT-dictation technology, any transcriptional errors that may result from process are unintentional.    Patient: Vanessa Greer  Service Category: E/M  Provider: Wallie Sherry, MD  DOB: 1961/01/12  DOS: 01/14/2024  Referring Provider: Fernande Ophelia JINNY DOUGLAS, MD  MRN: [id number]  Specialty: Interventional Pain Management  PCP: Fernande Ophelia JINNY DOUGLAS, MD  Type: Established Patient  Setting: Ambulatory outpatient    Location: Office  Delivery: Face-to-face     HPI  Ms. MAIANA HENNIGAN, a 63 y.o. year old female, is here today because of her Chronic bilateral thoracic back pain [M54.6, G89.29]. Ms. Hirschi primary complain today is Chest Pain and Pain (Right sided rib pain)  Pertinent problems: Ms. Lampe has Chronic bilateral low back pain with bilateral sciatica; Lumbar facet arthropathy; Lumbar spondylosis; Lumbar degenerative disc disease; Chronic pain syndrome; and Coccyodynia on their pertinent problem list. Pain Assessment: Severity of Chronic pain is reported as a 3 /10. Location: Rib cage Right/denies. Onset: More than a month ago. Quality: Sharp. Timing: Constant. Modifying factor(s): meds. Vitals:  height is 5' 4 (1.626 m) and weight is 158 lb (71.7 kg). Her temperature is 97.4 F (36.3 C) (abnormal). Her blood pressure is 108/60 and her pulse is 63. Her oxygen saturation is 100%.  BMI: Estimated body mass index is 27.12 kg/m as calculated from the following:   Height as of this encounter: 5' 4 (1.626 m).   Weight as of this encounter: 158 lb (71.7 kg). Last encounter: 10/15/2023. Last procedure: Visit date not found.  Reason for encounter: medication management.    Discussed the use of AI scribe software for clinical note transcription with the patient, who gave verbal consent to proceed.  History of Present Illness   The patient, with a history of chronic pain managed with Lyrica 150mg  twice daily, presents with a new onset of sharp, localized pain in the posterior lateral thoracic region. The pain began suddenly upon waking and has persisted for two days. The patient notes a possible association with physical exertion from shoveling snow and prolonged standing the day prior to symptom onset. The pain was temporarily alleviated but was exacerbated by a specific movement while drying hair. The patient has a history of arthritis in the same region, previously managed with diclofenac , which was discontinued due to gastrointestinal side effects.  In addition to the physical symptoms, the patient is experiencing significant situational stress related to her spouse's declining health due to cirrhosis and associated complications. The spouse's return to alcohol consumption and resultant behavioral changes have increased the patient's stress levels. The patient is also dealing with the pressures of work, although she has applied for retirement. This combination of physical pain and emotional stress is contributing to an overall sense of being overwhelmed.       Pharmacotherapy Assessment  Analgesic: Norco 10 mg TID prn  Monitoring: [place] PMP: PDMP reviewed during this encounter.       Pharmacotherapy: No side-effects or adverse reactions reported. Compliance: No problems identified. Effectiveness: Clinically acceptable.  Margrette Nathanel JINNY, RN  01/14/2024  8:11 AM  Sign when Signing Visit Safety precautions to be maintained throughout the outpatient stay will include: orient to surroundings, keep bed in low position, maintain call bell within reach at  all times, provide assistance with transfer out of bed and ambulation.   Nursing Pain Medication Assessment:   Safety precautions to be maintained throughout the outpatient stay will include: orient to surroundings, keep bed in low position, maintain call bell within reach at all times, provide assistance with transfer out of bed and ambulation.  Medication Inspection Compliance: Pill count conducted under aseptic conditions, in front of the patient. Neither the pills nor the bottle was removed from the patient's sight at any time. Once count was completed pills were immediately returned to the patient in their original bottle.  Medication: Hydrocodone /APAP Pill/Patch Count:  23 of 90 pills remain Pill/Patch Appearance: Markings consistent with prescribed medication Bottle Appearance: Standard pharmacy container. Clearly labeled. Filled Date: 23 / 57 / 2024 Last Medication intake:  Today    No results found for: CBDTHCR No results found for: D8THCCBX No results found for: D9THCCBX  UDS:  Summary  Date Value Ref Range Status  09/03/2023 Note  Final    Comment:    ==================================================================== [employer] [facility] (MW) ==================================================================== Specimen Alert Note: Urinary creatinine is low; ability to detect some drugs may be compromised. Interpret results with caution. (Creatinine) ==================================================================== Test                             Result       Flag       Units  Drug Present and Declared for Prescription Verification   Hydrocodone                     2605         EXPECTED   ng/mg creat   Hydromorphone                  695          EXPECTED   ng/mg creat   Dihydrocodeine                 400          EXPECTED   ng/mg creat   Norhydrocodone                 8000         EXPECTED   ng/mg creat    Sources of hydrocodone  include scheduled prescription medications.    Hydromorphone, dihydrocodeine and norhydrocodone are expected    metabolites of hydrocodone .  Hydromorphone and dihydrocodeine are    also available as scheduled prescription medications.  Drug Absent but Declared for Prescription Verification   Buprenorphine                   Not Detected UNEXPECTED ng/mg creat    Low dose buprenorphine  is not always detected even when used as    directed.  ==================================================================== Test                      Result    Flag   Units      Ref Range   Creatinine              19        LL     mg/dL      >=79 ==================================================================== Declared Medications:  The flagging and interpretation on this report are based on the  following declared medications.  Unexpected results may arise from  inaccuracies in the declared medications.   **Note: The testing scope of this panel includes  these medications:   Hydrocodone  (Norco)   **Note: The testing scope of this panel does not include small to  moderate amounts of these reported medications:   Buprenorphine  (Belbuca )   **Note: The testing scope of this panel does not include the  following reported medications:   Acetaminophen  (Norco)  Budesonide  (Symbicort )  Cyclobenzaprine  (Flexeril )  Diclofenac  (Voltaren )  Formoterol  (Symbicort )  Furosemide (Lasix)  Multivitamin  Pantoprazole (Protonix)  Pregabalin (Lyrica)  Sertraline (Zoloft)  Simvastatin (Zocor)  Trazodone (Desyrel) ==================================================================== For clinical consultation, please call [phone]. ====================================================================       ROS  Constitutional: Denies any fever or chills Gastrointestinal: No reported hemesis, hematochezia, vomiting, or acute GI distress Musculoskeletal:  right posterior lateral thoracic pain Neurological: No reported episodes of acute onset apraxia, aphasia, dysarthria, agnosia, amnesia, paralysis, loss of coordination, or loss of  consciousness  Medication Review  HYDROcodone -acetaminophen , budesonide -formoterol , celecoxib , cyclobenzaprine , furosemide, multivitamin with minerals, pantoprazole, pregabalin, sertraline, simvastatin, and traZODone  History Review  Allergy: Ms. Monds is allergic to bupropion. Drug: Ms. Freedman  reports no history of drug use. Alcohol:  reports that she does not currently use alcohol. Tobacco:  reports that she has been smoking cigarettes. She has a 41 pack-year smoking history. She has never used smokeless tobacco. Social: Ms. Bobe  reports that she has been smoking cigarettes. She has a 41 pack-year smoking history. She has never used smokeless tobacco. She reports that she does not currently use alcohol. She reports that she does not use drugs. Medical:  has a past medical history of Anxiety, Back pain, DDD (degenerative disc disease), lumbar, Depression, GERD (gastroesophageal reflux disease), High cholesterol, Lumbar radiculitis, Pneumothorax (1982), and Sleep apnea. Surgical: Ms. Trigueros  has a past surgical history that includes Abdominal hysterectomy; Foot surgery; Ectopic pregnancy surgery; Colonoscopy; Laser ablation condolamata (N/A, 12/13/2017); Esophagogastroduodenoscopy (egd) with propofol  (N/A, 03/17/2019); and Trigger finger release (Right). Family: family history includes Breast cancer (age of onset: 64) in her mother; Heart disease in her father.  Laboratory Chemistry Profile   Renal No results found for: BUN, CREATININE, LABCREA, BCR, GFR, GFRAA, GFRNONAA, LABVMA, EPIRU, EPINEPH24HUR, NOREPRU, NOREPI24HUR, DOPARU, [id number]  Hepatic No results found for: AST, ALT, ALBUMIN, ALKPHOS, HCVAB, AMYLASE, LIPASE, AMMONIA  Electrolytes No results found for: NA, K, CL, CALCIUM, MG, PHOS  Bone No results found for: VD25OH, [id number], CI6874NY7, CI7874NY7, 25OHVITD1, 25OHVITD2, 25OHVITD3, TESTOFREE,  TESTOSTERONE  Inflammation (CRP: Acute Phase) (ESR: Chronic Phase) No results found for: CRP, ESRSEDRATE, LATICACIDVEN       Note: Above Lab results reviewed.  Recent Imaging Review  CT CHEST LCS NODULE F/U LOW DOSE WO CONTRAST CLINICAL DATA:  Abnormal lung cancer screening CT. Current smoker, 38 pack-year history.  EXAM: CT CHEST WITHOUT CONTRAST FOR LUNG CANCER SCREENING NODULE FOLLOW-UP  TECHNIQUE: Multidetector CT imaging of the chest was performed following the standard protocol without IV contrast.  RADIATION DOSE REDUCTION: This exam was performed according to the departmental dose-optimization program which includes automated exposure control, adjustment of the mA and/or kV according to patient size and/or use of iterative reconstruction technique.  COMPARISON:  06/03/2023, 03/01/2023 and 02/28/2022.  FINDINGS: Cardiovascular: Atherosclerotic calcification of the aorta. Heart is enlarged. No pericardial effusion.  Mediastinum/Nodes: No pathologically enlarged mediastinal or axillary lymph nodes. Hilar regions are difficult to definitively evaluate without IV contrast. Esophagus is grossly unremarkable.  Lungs/Pleura: Centrilobular and paraseptal emphysema. Smoking related respiratory bronchiolitis. Significant waxing and waning pulmonary nodularity with multiple resolved pulmonary nodules and several new pulmonary nodules, measuring up to 2 5.5  mm in the apical left upper lobe (3/65). No pleural fluid. Airway is unremarkable.  Upper Abdomen: Visualized portions of the liver and gallbladder are unremarkable. 2.1 cm right adrenal nodule measures 0 Hounsfield units. No follow-up necessary. Visualized portions of the left adrenal gland, kidneys, spleen, pancreas, stomach and bowel are grossly unremarkable. No upper abdominal adenopathy.  Musculoskeletal: Mild degenerative changes in the spine.  IMPRESSION: 1. Significant waxing and waning pulmonary  nodularity with the largest new nodule measuring 5.5 mm in the left upper lobe. Findings are likely due to smoking related respiratory bronchiolitis. Patient may be a poor candidate for lung cancer screening while smoking. By size criteria, examination is categorized as Lung-RADS 3, probably benign findings. Short-term follow-up in 6 months is recommended with repeat low-dose chest CT without contrast (please use the following order, CT CHEST LCS NODULE FOLLOW-UP W/O CM). 2. Right adrenal adenoma. 3.  Emphysema (ICD10-J43.9). 4.  Aortic atherosclerosis (ICD10-I70.0).  Electronically Signed   By: Newell Eke M.D.   On: 09/30/2023 08:58 Note: Reviewed        Physical Exam  General appearance: Well nourished, well developed, and well hydrated. In no apparent acute distress Mental status: Alert, oriented x 3 (person, place, & time)       Respiratory: No evidence of acute respiratory distress Eyes: PERLA Vitals: BP 108/60   Pulse 63   Temp (!) 97.4 F (36.3 C)   Ht 5' 4 (1.626 m)   Wt 158 lb (71.7 kg)   SpO2 100%   BMI 27.12 kg/m  BMI: Estimated body mass index is 27.12 kg/m as calculated from the following:   Height as of this encounter: 5' 4 (1.626 m).   Weight as of this encounter: 158 lb (71.7 kg). Ideal: Ideal body weight: 54.7 kg (120 lb 9.5 oz) Adjusted ideal body weight: 61.5 kg (135 lb 8.9 oz)   Right mid axillary thoracic pain  Assessment   Diagnosis Status  1. Chronic bilateral thoracic back pain   2. Chronic pain syndrome   3. Cervicalgia   4. Degeneration of intervertebral disc of lumbar region with discogenic back pain   5. Degeneration of intervertebral disc of lumbar region with discogenic back pain and lower extremity pain    Having a Flare-up Controlled Controlled   Updated Problems: No problems updated.  Plan of Care  Problem-specific:  Assessment and Plan  Assessment and Plan    Thoracic Pain Acute sharp pain in the posterior  lateral thoracic region is likely musculoskeletal, exacerbated by movement such as drying hair. Arthritis in the thoracic spine is confirmed by previous x-ray. Diclofenac  is intolerable due to gastrointestinal side effects. Creatinine level is 0.8. Start Celebrex  100 mg twice a day after meals. If ineffective, consider a trigger point injection with local anesthetic to relax the muscle band.  Chronic Pain Management Chronic pain is managed with Lyrica 150 mg twice a day without issues of nausea or constipation. Continue current dosage. Medication refill is scheduled for January 19.  Situational Anxiety Significant stress and anxiety are related to a spouse's severe health issues, including cirrhosis and increased alcohol consumption, impacting overall well-being and contributing to musculoskeletal pain. Encourage stress management techniques. Consider referral to mental health support if anxiety persists.       Ms. CELICA KOTOWSKI has a current medication list which includes the following long-term medication(s): budesonide -formoterol , furosemide, lyrica, sertraline, simvastatin, trazodone, [START ON 01/19/2024] hydrocodone -acetaminophen , [START ON 02/18/2024] hydrocodone -acetaminophen , and [START ON 03/19/2024] hydrocodone -acetaminophen .  Pharmacotherapy (Medications Ordered):  Meds ordered this encounter  Medications   HYDROcodone -acetaminophen  (NORCO) 10-325 MG tablet    Sig: Take 1 tablet by mouth every 8 (eight) hours as needed for severe pain (pain score 7-10). Must last 30 days. For chronic pain.    Dispense:  90 tablet    Refill:  0    [place] STOP ACT - Not applicable. Fill one day early if pharmacy is closed on scheduled refill date.   HYDROcodone -acetaminophen  (NORCO) 10-325 MG tablet    Sig: Take 1 tablet by mouth every 8 (eight) hours as needed for severe pain (pain score 7-10). Must last 30 days. For chronic pain.    Dispense:  90 tablet    Refill:  0    [place] STOP ACT - Not applicable.  Fill one day early if pharmacy is closed on scheduled refill date.   HYDROcodone -acetaminophen  (NORCO) 10-325 MG tablet    Sig: Take 1 tablet by mouth every 8 (eight) hours as needed for severe pain (pain score 7-10). Must last 30 days. For chronic pain.    Dispense:  90 tablet    Refill:  0    [place] STOP ACT - Not applicable. Fill one day early if pharmacy is closed on scheduled refill date.   celecoxib  (CELEBREX ) 100 MG capsule    Sig: Take 1 capsule (100 mg total) by mouth 2 (two) times daily.    Dispense:  30 capsule    Refill:  2   PRN TPI Orders:  No orders of the defined types were placed in this encounter.  Follow-up plan:   Return in about 3 months (around 04/13/2024) for MM, F2F.      status post left L3, L4, L5, S1 RFA on 07/06/2019 and right L3, L4, L5, S1 RFA on 07/22/2019-         Recent Visits No visits were found meeting these conditions. Showing recent visits within past 90 days and meeting all other requirements Today's Visits Date Type Provider Dept  01/14/24 Office Visit Marcelino Nurse, MD [facility]  Showing today's visits and meeting all other requirements Future Appointments Date Type Provider Dept  04/09/24 Appointment Marcelino Nurse, MD [facility]  Showing future appointments within next 90 days and meeting all other requirements  I discussed the assessment and treatment plan with the patient. The patient was provided an opportunity to ask questions and all were answered. The patient agreed with the plan and demonstrated an understanding of the instructions.  Patient advised to call back or seek an in-person evaluation if the symptoms or condition worsens.  Duration of encounter: 30 minutes.  Total time on encounter, as per AMA guidelines included both the face-to-face and non-face-to-face time personally spent by the physician and/or other qualified health care professional(s) on the day of the encounter (includes time in activities that  require the physician or other qualified health care professional and does not include time in activities normally performed by clinical staff). Physician's time may include the following activities when performed: Preparing to see the patient (e.g., pre-charting review of records, searching for previously ordered imaging, lab work, and nerve conduction tests) Review of prior analgesic pharmacotherapies. Reviewing PMP Interpreting ordered tests (e.g., lab work, imaging, nerve conduction tests) Performing post-procedure evaluations, including interpretation of diagnostic procedures Obtaining and/or reviewing separately obtained history Performing a medically appropriate examination and/or evaluation Counseling and educating the patient/family/caregiver Ordering medications, tests, or procedures Referring and communicating with other health care professionals (when not separately reported) Documenting clinical  information in the electronic or other health record Independently interpreting results (not separately reported) and communicating results to the patient/ family/caregiver Care coordination (not separately reported)  Note by: Wallie Sherry, MD Date: 01/14/2024; Time: 8:42 AM

## 2024-01-14 NOTE — Progress Notes (Signed)
 Safety precautions to be maintained throughout the outpatient stay will include: orient to surroundings, keep bed in low position, maintain call bell within reach at all times, provide assistance with transfer out of bed and ambulation.   Nursing Pain Medication Assessment:  Safety precautions to be maintained throughout the outpatient stay will include: orient to surroundings, keep bed in low position, maintain call bell within reach at all times, provide assistance with transfer out of bed and ambulation.  Medication Inspection Compliance: Pill count conducted under aseptic conditions, in front of the patient. Neither the pills nor the bottle was removed from the patient's sight at any time. Once count was completed pills were immediately returned to the patient in their original bottle.  Medication: Hydrocodone /APAP Pill/Patch Count:  23 of 90 pills remain Pill/Patch Appearance: Markings consistent with prescribed medication Bottle Appearance: Standard pharmacy container. Clearly labeled. Filled Date: 73 / 72 / 2024 Last Medication intake:  Today

## 2024-02-04 ENCOUNTER — Other Ambulatory Visit: Payer: BC Managed Care – PPO

## 2024-03-18 ENCOUNTER — Encounter: Payer: Self-pay | Admitting: Cardiology

## 2024-03-18 NOTE — Progress Notes (Signed)
Unable to contact patient to schedule echocardiogram. Letter sent, order cancelled 

## 2024-04-09 ENCOUNTER — Encounter: Payer: 59 | Admitting: Student in an Organized Health Care Education/Training Program

## 2024-04-21 ENCOUNTER — Encounter: Payer: Self-pay | Admitting: Student in an Organized Health Care Education/Training Program

## 2024-04-21 ENCOUNTER — Ambulatory Visit
Attending: Student in an Organized Health Care Education/Training Program | Admitting: Student in an Organized Health Care Education/Training Program

## 2024-04-21 VITALS — BP 115/63 | HR 66 | Temp 97.5°F | Resp 16 | Ht 64.0 in | Wt 157.0 lb

## 2024-04-21 DIAGNOSIS — G894 Chronic pain syndrome: Secondary | ICD-10-CM | POA: Diagnosis not present

## 2024-04-21 DIAGNOSIS — G8929 Other chronic pain: Secondary | ICD-10-CM | POA: Diagnosis present

## 2024-04-21 DIAGNOSIS — M47816 Spondylosis without myelopathy or radiculopathy, lumbar region: Secondary | ICD-10-CM | POA: Insufficient documentation

## 2024-04-21 DIAGNOSIS — Z79899 Other long term (current) drug therapy: Secondary | ICD-10-CM | POA: Insufficient documentation

## 2024-04-21 DIAGNOSIS — M5416 Radiculopathy, lumbar region: Secondary | ICD-10-CM | POA: Diagnosis not present

## 2024-04-21 DIAGNOSIS — M5136 Other intervertebral disc degeneration, lumbar region with discogenic back pain only: Secondary | ICD-10-CM | POA: Diagnosis present

## 2024-04-21 DIAGNOSIS — M546 Pain in thoracic spine: Secondary | ICD-10-CM | POA: Diagnosis present

## 2024-04-21 MED ORDER — HYDROCODONE-ACETAMINOPHEN 10-325 MG PO TABS: 1.0000 | ORAL_TABLET | Freq: Three times a day (TID) | ORAL | 0 refills | Status: DC | PRN

## 2024-04-21 NOTE — Progress Notes (Signed)
 Nursing Pain Medication Assessment:  Safety precautions to be maintained throughout the outpatient stay will include: orient to surroundings, keep bed in low position, maintain call bell within reach at all times, provide assistance with transfer out of bed and ambulation.   Medication Inspection Compliance: Pill count conducted under aseptic conditions, in front of the patient. Neither the pills nor the bottle was removed from the patient's sight at any time. Once count was completed pills were immediately returned to the patient in their original bottle.  Medication: Hydrocodone /APAP Pill/Patch Count:  5 of 90 pills remain Pill/Patch Appearance: Markings consistent with prescribed medication Bottle Appearance: Standard pharmacy container. Clearly labeled. Filled Date: 03 / 22 / 2025 Last Medication intake:  Today

## 2024-04-21 NOTE — Progress Notes (Signed)
 PROVIDER NOTE: Interpretation of information contained herein should be left to medically-trained personnel. Specific patient instructions are provided elsewhere under "Patient Instructions" section of medical record. This document was created in part using AI and STT-dictation technology, any transcriptional errors that may result from this process are unintentional.  Patient: Vanessa Greer  Service: E/M   PCP: Melchor Spoon, MD  DOB: 02/24/1961  DOS: 04/21/2024  Provider: Cherylin Corrigan, NP  MRN: [id number]  Delivery: Face-to-face  Specialty: Interventional Pain Management  Type: Established Patient  Setting: Ambulatory outpatient facility  Specialty designation: 09  Referring Prov.: Melchor Spoon, MD  Location: Outpatient office facility       HPI  Vanessa Greer, a 63 y.o. year old female, is here today because of her Chronic pain syndrome [G89.4]. Ms. Robak primary complain today is Back Pain (Lower right )   Pain Assessment: Severity of Chronic pain is reported as a 4 /10. Location: Back Lower, Right/Radaites from lower back into the buttocks. Onset: More than a month ago. Quality: Constant, Aching, Sharp. Timing: Constant. Modifying factor(s): Pain medication and heat. Vitals:  height is 5\' 4"  (1.626 m) and weight is 157 lb (71.2 kg). Her temporal temperature is 97.5 F (36.4 C) (abnormal). Her blood pressure is 115/63 and her pulse is 66. Her respiration is 16 and oxygen saturation is 100%.  BMI: Estimated body mass index is 26.95 kg/m as calculated from the following:   Height as of this encounter: 5\' 4"  (1.626 m).   Weight as of this encounter: 157 lb (71.2 kg). Last encounter: 01/14/2024.  Reason for encounter: medication management.  The patient indicates doing well with the current medication regimen.  No adverse reaction or side effects reported to the medication.  Prescribing drug monitoring (PDMP) consistent with the prescribed medication. The patient complains of  bilateral low back pain, with radiation to the right buttock and down the posterior aspect of the right thigh, suggestive of sciatic nerve involvement.  Pharmacotherapy Assessment  Analgesic: Hydrocodone -acetaminophen  (Norco) 10-325 mg tablet every 8 hours as needed for pain. MME=30  Monitoring: [place] PMP: PDMP reviewed during this encounter.       Pharmacotherapy: No side-effects or adverse reactions reported. Compliance: No problems identified. Effectiveness: Clinically acceptable.  Huston Maiers, RN  04/21/2024  9:45 AM  Sign when Signing Visit Nursing Pain Medication Assessment:  Safety precautions to be maintained throughout the outpatient stay will include: orient to surroundings, keep bed in low position, maintain call bell within reach at all times, provide assistance with transfer out of bed and ambulation.   Medication Inspection Compliance: Pill count conducted under aseptic conditions, in front of the patient. Neither the pills nor the bottle was removed from the patient's sight at any time. Once count was completed pills were immediately returned to the patient in their original bottle.  Medication: Hydrocodone /APAP Pill/Patch Count:  5 of 90 pills remain Pill/Patch Appearance: Markings consistent with prescribed medication Bottle Appearance: Standard pharmacy container. Clearly labeled. Filled Date: 03 / 22 / 2025 Last Medication intake:  Today    No results found for: "CBDTHCR" No results found for: "D8THCCBX" No results found for: "D9THCCBX"  UDS:  Summary  Date Value Ref Range Status  09/03/2023 Note  Final    Comment:    ==================================================================== [facility] (MW) ==================================================================== Specimen Alert Note: Urinary creatinine is low; ability to detect some drugs may be compromised. Interpret results with caution.  (Creatinine) ==================================================================== Test  Result       Flag       Units  Drug Present and Declared for Prescription Verification   Hydrocodone                     2605         EXPECTED   ng/mg creat   Hydromorphone                  695          EXPECTED   ng/mg creat   Dihydrocodeine                 400          EXPECTED   ng/mg creat   Norhydrocodone                 8000         EXPECTED   ng/mg creat    Sources of hydrocodone  include scheduled prescription medications.    Hydromorphone, dihydrocodeine and norhydrocodone are expected    metabolites of hydrocodone . Hydromorphone and dihydrocodeine are    also available as scheduled prescription medications.  Drug Absent but Declared for Prescription Verification   Buprenorphine                   Not Detected UNEXPECTED ng/mg creat    Low dose buprenorphine  is not always detected even when used as    directed.  ==================================================================== Test                      Result    Flag   Units      Ref Range   Creatinine              19        LL     mg/dL      >=24 ==================================================================== Declared Medications:  The flagging and interpretation on this report are based on the  following declared medications.  Unexpected results may arise from  inaccuracies in the declared medications.   **Note: The testing scope of this panel includes these medications:   Hydrocodone  (Norco)   **Note: The testing scope of this panel does not include small to  moderate amounts of these reported medications:   Buprenorphine  (Belbuca )   **Note: The testing scope of this panel does not include the  following reported medications:   Acetaminophen  (Norco)  Budesonide  (Symbicort )  Cyclobenzaprine  (Flexeril )  Diclofenac  (Voltaren )  Formoterol  (Symbicort )  Furosemide (Lasix)  Multivitamin   Pantoprazole (Protonix)  Pregabalin (Lyrica)  Sertraline (Zoloft)  Simvastatin (Zocor)  Trazodone (Desyrel) ==================================================================== For clinical consultation, please call [phone]. ====================================================================       ROS  Constitutional: Denies any fever or chills Gastrointestinal: No reported hemesis, hematochezia, vomiting, or acute GI distress Musculoskeletal: low back pain bilateral (R>L), right hip pain radiate to back of the thigh  Neurological: No reported episodes of acute onset apraxia, aphasia, dysarthria, agnosia, amnesia, paralysis, loss of coordination, or loss of consciousness  Medication Review  HYDROcodone -acetaminophen , budesonide -formoterol , cyclobenzaprine , furosemide, multivitamin with minerals, pantoprazole, pregabalin, sertraline, simvastatin, and traZODone  History Review  Allergy: Ms. Gensel is allergic to bupropion. Drug: Ms. Albritton  reports no history of drug use. Alcohol:  reports that she does not currently use alcohol. Tobacco:  reports that she has been smoking cigarettes. She has a 41 pack-year smoking history. She has never used smokeless tobacco. Social: Ms. Peri  reports that she has been smoking  cigarettes. She has a 41 pack-year smoking history. She has never used smokeless tobacco. She reports that she does not currently use alcohol. She reports that she does not use drugs. Medical:  has a past medical history of Anxiety, Back pain, DDD (degenerative disc disease), lumbar, Depression, GERD (gastroesophageal reflux disease), High cholesterol, Lumbar radiculitis, Pneumothorax (1982), and Sleep apnea. Surgical: Ms. Smedley  has a past surgical history that includes Abdominal hysterectomy; Foot surgery; Ectopic pregnancy surgery; Colonoscopy; Laser ablation condolamata (N/A, 12/13/2017); Esophagogastroduodenoscopy (egd) with propofol  (N/A, 03/17/2019); and  Trigger finger release (Right). Family: family history includes Breast cancer (age of onset: 40) in her mother; Heart disease in her father.  Laboratory Chemistry Profile   Renal No results found for: "BUN", "CREATININE", "LABCREA", "BCR", "GFR", "GFRAA", "GFRNONAA", "LABVMA", "EPIRU", "EPINEPH24HUR", "NOREPRU", "NOREPI24HUR", "DOPARU", "DOPAM24HRUR"  Hepatic No results found for: "AST", "ALT", "ALBUMIN", "ALKPHOS", "HCVAB", "AMYLASE", "LIPASE", "AMMONIA"  Electrolytes No results found for: "NA", "K", "CL", "CALCIUM", "MG", "PHOS"  Bone No results found for: "VD25OH", "VD125OH2TOT", "ZO1096EA5", "WU9811BJ4", "25OHVITD1", "25OHVITD2", "25OHVITD3", "TESTOFREE", "TESTOSTERONE"  Inflammation (CRP: Acute Phase) (ESR: Chronic Phase) No results found for: "CRP", "ESRSEDRATE", "LATICACIDVEN"       Note: Above Lab results reviewed.  Recent Imaging Review  CT CHEST LCS NODULE F/U LOW DOSE WO CONTRAST CLINICAL DATA:  Abnormal lung cancer screening CT. Current smoker, 38 pack-year history.  EXAM: CT CHEST WITHOUT CONTRAST FOR LUNG CANCER SCREENING NODULE FOLLOW-UP  TECHNIQUE: Multidetector CT imaging of the chest was performed following the standard protocol without IV contrast.  RADIATION DOSE REDUCTION: This exam was performed according to the departmental dose-optimization program which includes automated exposure control, adjustment of the mA and/or kV according to patient size and/or use of iterative reconstruction technique.  COMPARISON:  06/03/2023, 03/01/2023 and 02/28/2022.  FINDINGS: Cardiovascular: Atherosclerotic calcification of the aorta. Heart is enlarged. No pericardial effusion.  Mediastinum/Nodes: No pathologically enlarged mediastinal or axillary lymph nodes. Hilar regions are difficult to definitively evaluate without IV contrast. Esophagus is grossly unremarkable.  Lungs/Pleura: Centrilobular and paraseptal emphysema. Smoking related respiratory bronchiolitis.  Significant waxing and waning pulmonary nodularity with multiple resolved pulmonary nodules and several new pulmonary nodules, measuring up to 2 5.5 mm in the apical left upper lobe (3/65). No pleural fluid. Airway is unremarkable.  Upper Abdomen: Visualized portions of the liver and gallbladder are unremarkable. 2.1 cm right adrenal nodule measures 0 Hounsfield units. No follow-up necessary. Visualized portions of the left adrenal gland, kidneys, spleen, pancreas, stomach and bowel are grossly unremarkable. No upper abdominal adenopathy.  Musculoskeletal: Mild degenerative changes in the spine.  IMPRESSION: 1. Significant waxing and waning pulmonary nodularity with the largest new nodule measuring 5.5 mm in the left upper lobe. Findings are likely due to smoking related respiratory bronchiolitis. Patient may be a poor candidate for lung cancer screening while smoking. By size criteria, examination is categorized as Lung-RADS 3, probably benign findings. Short-term follow-up in 6 months is recommended with repeat low-dose chest CT without contrast (please use the following order, "CT CHEST LCS NODULE FOLLOW-UP W/O CM"). 2. Right adrenal adenoma. 3.  Emphysema (ICD10-J43.9). 4.  Aortic atherosclerosis (ICD10-I70.0).  Electronically Signed   By: Shearon Denis M.D.   On: 09/30/2023 08:58 Note: Reviewed        Physical Exam  General appearance: Well nourished, well developed, and well hydrated. In no apparent acute distress Mental status: Alert, oriented x 3 (person, place, & time)       Respiratory: No evidence of acute respiratory distress Eyes:  PERLA Vitals: BP 115/63 (Patient Position: Sitting, Cuff Size: Normal)   Pulse 66   Temp (!) 97.5 F (36.4 C) (Temporal)   Resp 16   Ht 5\' 4"  (1.626 m)   Wt 157 lb (71.2 kg)   SpO2 100%   BMI 26.95 kg/m  BMI: Estimated body mass index is 26.95 kg/m as calculated from the following:   Height as of this encounter: 5\' 4"  (1.626  m).   Weight as of this encounter: 157 lb (71.2 kg). Ideal: Ideal body weight: 54.7 kg (120 lb 9.5 oz) Adjusted ideal body weight: 61.3 kg (135 lb 2.5 oz)  Lumbar pain bilateral, right hip pain worse with bending and yard working   Assessment   Diagnosis Status  1. Chronic pain syndrome   2. Lumbar radiculopathy   3. Chronic bilateral thoracic back pain   4. Lumbar spondylosis   5. Degeneration of intervertebral disc of lumbar region with discogenic back pain   6. Medication management    Controlled Controlled Controlled   Plan of Care  Assessment and Plan We will continue on current medication regimen. The patient was advised to monitor symptoms, and if the pain persist and or worsen to call the office or schedule a follow-up appointment for further evaluation.  The patient was educated that increasing the frequency or dose of medication may lead to decreased effectiveness and potential medication resistance.  It was recommended to consider interventional therapies as a part of comprehensive approach to managing chronic pain, in conjunction with the current medication regimen.   Pharmacotherapy (Medications Ordered): Meds ordered this encounter  Medications   HYDROcodone -acetaminophen  (NORCO) 10-325 MG tablet    Sig: Take 1 tablet by mouth every 8 (eight) hours as needed for severe pain (pain score 7-10). Must last 30 days. For chronic pain.    Dispense:  90 tablet    Refill:  0    [place] STOP ACT - Not applicable. Fill one day early if pharmacy is closed on scheduled refill date.   HYDROcodone -acetaminophen  (NORCO) 10-325 MG tablet    Sig: Take 1 tablet by mouth every 8 (eight) hours as needed for severe pain (pain score 7-10). Must last 30 days. For chronic pain.    Dispense:  90 tablet    Refill:  0    [place] STOP ACT - Not applicable. Fill one day early if pharmacy is closed on scheduled refill date.   HYDROcodone -acetaminophen  (NORCO) 10-325 MG tablet    Sig: Take 1 tablet by  mouth every 8 (eight) hours as needed for severe pain (pain score 7-10). Must last 30 days. For chronic pain.    Dispense:  90 tablet    Refill:  0    [place] STOP ACT - Not applicable. Fill one day early if pharmacy is closed on scheduled refill date.   Orders:  No orders of the defined types were placed in this encounter.  Follow-up plan:   Return in about 3 months (around 07/21/2024) for (F2F), (MM), Marthe Slain NP.    Recent Visits No visits were found meeting these conditions. Showing recent visits within past 90 days and meeting all other requirements Today's Visits Date Type Provider Dept  04/21/24 Office Visit Cephus Collin, MD [facility]  Showing today's visits and meeting all other requirements Future Appointments Date Type Provider Dept  07/14/24 Appointment Asyria Kolander K, NP [facility]  Showing future appointments within next 90 days and meeting all other requirements  I discussed the assessment  and treatment plan with the patient. The patient was provided an opportunity to ask questions and all were answered. The patient agreed with the plan and demonstrated an understanding of the instructions.  Patient advised to call back or seek an in-person evaluation if the symptoms or condition worsens.  Duration of encounter: 30 minutes.  Total time on encounter, as per AMA guidelines included both the face-to-face and non-face-to-face time personally spent by the physician and/or other qualified health care professional(s) on the day of the encounter (includes time in activities that require the physician or other qualified health care professional and does not include time in activities normally performed by clinical staff). Physician's time may include the following activities when performed: Preparing to see the patient (e.g., pre-charting review of records, searching for previously ordered imaging, lab work, and nerve conduction tests) Review of prior analgesic  pharmacotherapies. Reviewing PMP Interpreting ordered tests (e.g., lab work, imaging, nerve conduction tests) Performing post-procedure evaluations, including interpretation of diagnostic procedures Obtaining and/or reviewing separately obtained history Performing a medically appropriate examination and/or evaluation Counseling and educating the patient/family/caregiver Ordering medications, tests, or procedures Referring and communicating with other health care professionals (when not separately reported) Documenting clinical information in the electronic or other health record Independently interpreting results (not separately reported) and communicating results to the patient/ family/caregiver Care coordination (not separately reported)  Note by: Rosabel Sermeno K Jayliani Wanner, NP (TTS and AI technology used. I apologize for any typographical errors that were not detected and corrected.) Date: 04/21/2024; Time: 10:27 AM

## 2024-04-21 NOTE — Patient Instructions (Signed)
Medication Rules  Applies to: All patients receiving prescriptions (written or electronic).  Pharmacy of record: Pharmacy where electronic prescriptions will be sent. If written prescriptions are taken to a different pharmacy, please inform the nursing staff. The pharmacy listed in the electronic medical record should be the one where you would like electronic prescriptions to be sent.  Prescription refills: Only during scheduled appointments. Applies to both, written and electronic prescriptions.  NOTE: The following applies primarily to controlled substances (Opioid* Pain Medications).   Patient's responsibilities: Pain Pills: Bring all pain pills to every appointment (except for procedure appointments). Pill Bottles: Bring pills in original pharmacy bottle. Always bring newest bottle. Bring bottle, even if empty. Medication refills: You are responsible for knowing and keeping track of what medications you need refilled. The day before your appointment, write a list of all prescriptions that need to be refilled. Bring that list to your appointment and give it to the admitting nurse. Prescriptions will be written only during appointments. If you forget a medication, it will not be "Called in", "Faxed", or "electronically sent". You will need to get another appointment to get these prescribed. Prescription Accuracy: You are responsible for carefully inspecting your prescriptions before leaving our office. Have the discharge nurse carefully go over each prescription with you, before taking them home. Make sure that your name is accurately spelled, that your address is correct. Check the name and dose of your medication to make sure it is accurate. Check the number of pills, and the written instructions to make sure they are clear and accurate. Make sure that you are given enough medication to last until your next medication refill appointment. Taking Medication: Take medication as prescribed. Never  take more pills than instructed. Never take medication more frequently than prescribed. Taking less pills or less frequently is permitted and encouraged, when it comes to controlled substances (written prescriptions).  Inform other Doctors: Always inform, all of your healthcare providers, of all the medications you take. Pain Medication from other Providers: You are not allowed to accept any additional pain medication from any other Doctor or Healthcare provider. There are two exceptions to this rule. (see below) In the event that you require additional pain medication, you are responsible for notifying us, as stated below. Medication Agreement: You are responsible for carefully reading and following our Medication Agreement. This must be signed before receiving any prescriptions from our practice. Safely store a copy of your signed Agreement. Violations to the Agreement will result in no further prescriptions. (Additional copies of our Medication Agreement are available upon request.) Laws, Rules, & Regulations: All patients are expected to follow all Federal and State Laws, Statutes, Rules, & Regulations. Ignorance of the Laws does not constitute a valid excuse. The use of any illegal substances is prohibited. Adopted CDC guidelines & recommendations: Target dosing levels will be at or below 60 MME/day. Use of benzodiazepines** is not recommended.  Exceptions: There are only two exceptions to the rule of not receiving pain medications from other Healthcare Providers. Exception #1 (Emergencies): In the event of an emergency (i.e.: accident requiring emergency care), you are allowed to receive additional pain medication. However, you are responsible for: As soon as you are able, call our office [phone], at any time of the day or night, and leave a message stating your name, the date and nature of the emergency, and the name and dose of the medication prescribed. In the event that your call is answered  by a member of   our staff, make sure to document and save the date, time, and the name of the person that took your information.  Exception #2 (Planned Surgery): In the event that you are scheduled by another doctor or dentist to have any type of surgery or procedure, you are allowed (for a period no longer than 30 days), to receive additional pain medication, for the acute post-op pain. However, in this case, you are responsible for picking up a copy of our "Post-op Pain Management for Surgeons" handout, and giving it to your surgeon or dentist. This document is available at our office, and does not require an appointment to obtain it. Simply go to our office during business hours (Monday-Thursday from 8:00 AM to 4:00 PM) (Friday 8:00 AM to 12:00 Noon) or if you have a scheduled appointment with us, prior to your surgery, and ask for it by name. In addition, you will need to provide us with your name, name of your surgeon, type of surgery, and date of procedure or surgery.  *Opioid medications include: morphine, codeine, oxycodone, oxymorphone, hydrocodone, hydromorphone, meperidine, tramadol, tapentadol, buprenorphine, fentanyl, methadone. **Benzodiazepine medications include: diazepam (Valium), alprazolam (Xanax), clonazepam (Klonopine), lorazepam (Ativan), clorazepate (Tranxene), chlordiazepoxide (Librium), estazolam (Prosom), oxazepam (Serax), temazepam (Restoril), triazolam (Halcion) (Last updated: 02/27/2018)  

## 2024-05-08 ENCOUNTER — Other Ambulatory Visit: Payer: Self-pay | Admitting: Internal Medicine

## 2024-05-08 ENCOUNTER — Telehealth: Payer: Self-pay

## 2024-05-08 DIAGNOSIS — Z1231 Encounter for screening mammogram for malignant neoplasm of breast: Secondary | ICD-10-CM

## 2024-05-08 NOTE — Telephone Encounter (Signed)
 It appears I have an 8:30 slot on 13 May available.  We can work her in then.  If her symptoms worsen in the interim recommend to be seen in the ED.

## 2024-05-08 NOTE — Telephone Encounter (Signed)
 Patient advised. NFN.

## 2024-05-08 NOTE — Telephone Encounter (Signed)
 Copied from CRM [id number]. Topic: Appointments - Scheduling Inquiry for Clinic >> May 08, 2024  8:32 AM Vanessa Greer wrote: Reason for CRM: patient is calling to get scheduled with dr Gregary Lean she answered yes to the question new or worsening symptoms . So it would have been a acute visit . Didn't see anything with Dr Gregary Lean till June and the other providers didn't have anything within the 2 days a acute visit is supposed to be scheduled . Please reach out to patient to get her scheduled asap [phone] >> May 08, 2024  9:18 AM Vanessa Greer wrote: Why was this patient not sent to the triage nurses?

## 2024-05-08 NOTE — Telephone Encounter (Signed)
 How long have your symptoms been going on for? Last Wednesday.  Any fevers, chills or sweats? Yes. 100 Any cough? If so are you getting anything up? What color? Yes. Light colored phlegm.  Any SOB? No Any wheezing? No  Do you monitor your oxygen at home? No Do you wear O2? No  What medications do you take? -Patient was seen at Urgent Care on 05/03/2024 and was started on  Agumentin for 5 days and prednisone for 5 days. Albuterol  PRN.  Covid test? -Was checked for COVID and Flu and were negative at urgent care. Patient reports Chest x-ray was also done.    Please advise.

## 2024-05-11 ENCOUNTER — Ambulatory Visit
Admission: RE | Admit: 2024-05-11 | Discharge: 2024-05-11 | Disposition: A | Discharge status: Home / Self Care | Source: Ambulatory Visit | Attending: Internal Medicine | Admitting: Internal Medicine

## 2024-05-11 DIAGNOSIS — Z1231 Encounter for screening mammogram for malignant neoplasm of breast: Secondary | ICD-10-CM | POA: Diagnosis present

## 2024-05-12 ENCOUNTER — Ambulatory Visit (INDEPENDENT_AMBULATORY_CARE_PROVIDER_SITE_OTHER): Admitting: Pulmonary Disease

## 2024-05-12 ENCOUNTER — Encounter: Payer: Self-pay | Admitting: Pulmonary Disease

## 2024-05-12 VITALS — BP 120/74 | HR 62 | Temp 97.7°F | Ht 64.0 in | Wt 158.2 lb

## 2024-05-12 DIAGNOSIS — J449 Chronic obstructive pulmonary disease, unspecified: Secondary | ICD-10-CM

## 2024-05-12 DIAGNOSIS — F17210 Nicotine dependence, cigarettes, uncomplicated: Secondary | ICD-10-CM | POA: Diagnosis not present

## 2024-05-12 DIAGNOSIS — J84115 Respiratory bronchiolitis interstitial lung disease: Secondary | ICD-10-CM | POA: Diagnosis not present

## 2024-05-12 DIAGNOSIS — R918 Other nonspecific abnormal finding of lung field: Secondary | ICD-10-CM

## 2024-05-12 NOTE — Patient Instructions (Addendum)
 VISIT SUMMARY:  Vanessa Greer, a 63 year old female with stage one COPD, came in for a follow-up after a recent exacerbation. The exacerbation was likely triggered by environmental factors such as spray painting and included symptoms like nasal congestion, fever, and difficulty breathing. She was treated with prednisone and amoxicillin at urgent care and is now feeling better. She continues to smoke but is attempting to reduce her cigarette intake. She uses Symbicort  and a rescue inhaler as needed.  YOUR PLAN:  -CHRONIC OBSTRUCTIVE PULMONARY DISEASE (COPD): COPD is a chronic lung disease that makes it hard to breathe. Your recent exacerbation was likely triggered by exposure to spray paint fumes and/or seasonal allergies. You were treated with prednisone and amoxicillin and are now asymptomatic. Continue using Symbicort  160 mcg and use albuterol  as needed for your cough. A chest CT will be ordered within the next month, and you should follow up in two months.  -RESPIRATORY BRONCHIOLITIS: Respiratory bronchiolitis is a condition where the small airways in the lungs become inflamed, often due to smoking. You are currently reducing your smoking, which is good. Continue your efforts to reduce smoking further.  A chest CT to follow-up on this condition will be ordered within the month.  INSTRUCTIONS:  A chest CT will be ordered within the next month. Please follow up in two months.

## 2024-05-12 NOTE — Progress Notes (Signed)
 Subjective:    Patient ID: Vanessa Greer, female    DOB: Aug 04, 1961, 63 y.o.   MRN: [id number]  Patient Care Team: Melchor Spoon, MD as PCP - General (Internal Medicine) Constancia Delton, MD as PCP - Cardiology (Cardiology) Candyce Champagne, MD as Consulting Physician (Colon and Rectal Surgery) Deveron Fly, MD (Inactive) as Consulting Physician (Gastroenterology) Dorisann Garre, Thornell Flirt, MD as Referring Physician (Dermatology) Schermerhorn, Joselyn Nicely, MD as Referring Physician (Obstetrics and Gynecology)  Chief Complaint  Patient presents with   Follow-up    Occasional cough with thin tan phlegm. No wheezing or shortness of breath.     BACKGROUND/INTERVAL:Vanessa Greer is a 63 year old with a 41-pack-year history of smoking and a medical history as noted below who presents for follow-up of normal lung cancer screening CT. I initially saw the patient on 26 June 2023 for the details of that visit please refer to that note.  Recall that the patient had low-dose lung cancer screening CT on 03 June 2023.  This was classified as a lung RADS 4A, suspicious.  It notes centrilobular and paraseptal emphysema, smoking-related respiratory bronchiolitis, waxing and waning centrilobular nodularity in the upper and mid lung zones with overall progression from 01 March 2023.  Some of the findings are largely new from the March CT. she had follow-up CT on 13 September 2023 and this showed significant waxing and waning of pulmonary nodularity with the largest new nodule being 5.5 mm in the left upper lobe.  The findings are more likely due to "smokers bronchiolitis"/RB ILD.  She is considered a poor candidate for lung cancer screening due to the waxing and waning nature of her nodularity.  She follows here today after her visit of 08 October 2023.  HPI Discussed the use of AI scribe software for clinical note transcription with the patient, who gave verbal consent to proceed.  History of Present  Illness   Vanessa Greer is a 63 year old female with stage one COPD who presents for a follow-up after a recent exacerbation.  She experienced a recent exacerbation of her stage one COPD, likely triggered by environmental factors such as spray painting in her home. The exacerbation began with rhinorrhea, progressing to complete nasal congestion by Thursday night. By Saturday, she felt significantly unwell and developed a fever of 100.52F on Sunday morning. She also experienced chest pressure and difficulty taking deep breaths without discomfort.  She visited urgent care where influenza and COVID-19 were ruled out. A chest x-ray did not show infiltrate and she was treated for a COPD exacerbation. She was treated with prednisone 50 mg for five days and amoxicillin for five days. She reports feeling better yesterday and today compared to the last two weeks. Initially, she experienced significant fatigue, noting that even going to the bathroom caused her heart to race.  She is a current smoker, smoking two cigarettes a day, and is attempting to wean off completely. She uses Symbicort  160 mcg twice daily and has used her rescue inhaler twice yesterday but not today. She notes that the rescue inhaler helps when used. She has not used it since her wheezing stopped, except for when advised to use it for her cough.  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 She has been trying to keep up with her health appointments, having scheduled an echocardiogram and completed a mammogram.     DATA 06/03/2023 LDCT chest: Waxing and waning upper and midlung zone predominant centrilobular  nodularity progressive from 01 March 2023 and largely new from 28 February 2022.  Possibility of infectious/inflammatory etiology such as sarcoid is entertained.  Malignancy cannot be excluded.  Exam is categorized as lung RADS 4A 07/17/2023 echocardiogram: LVEF 55 to 60%, LV normal function.  Grade 1 DD.  RV  systolic function normal.  Normal pulmonary artery systolic pressure.  Small to moderate pericardial effusion, no evidence of cardiac tamponade.  Mild mitral regurgitation. 08/06/2023 PFTs: FEV1 2.27 L or 89% predicted, FVC 2.81 L or 85% predicted, FEV1 FVC 81%, lung volumes normal, diffusion capacity normal, overall, normal study. 09/13/2023 LDCT chest: Significant waxing and waning pulmonary nodularity with the largest nodule measuring 5.5 mm in the upper lobe.  Findings likely due to smoking-related respiratory bronchiolitis.  Examination is categorized as lung RADS 3, short-term follow-up in 6 months recommended.  Emphysema also noted.  Review of Systems A 10 point review of systems was performed and it is as noted above otherwise negative.   Patient Active Problem List   Diagnosis Date Noted   Lumbar radiculopathy 04/21/2024   Medication management 04/21/2024   Chronic bilateral thoracic back pain 06/11/2023   Cervicalgia 06/11/2023   Bilateral primary osteoarthritis of knee 12/18/2022   Coccyodynia 02/25/2020   Lumbar facet arthropathy 10/02/2018   Lumbar spondylosis 10/02/2018   Lumbar degenerative disc disease 10/02/2018   Chronic pain syndrome 10/02/2018   Anal condylomata s/p laser ablation 12/13/2017 12/13/2017   Prolapsed internal hemorrhoids, grade 3, s/p ligation/pexy 12/13/2017 12/13/2017   Chronic bilateral low back pain with bilateral sciatica 01/29/2017   GERD without esophagitis 01/28/2017    Social History   Tobacco Use   Smoking status: Every Day    Current packs/day: 1.00    Average packs/day: 1 pack/day for 46.4 years (46.4 ttl pk-yrs)    Types: Cigarettes    Start date: 1979   Smokeless tobacco: Never   Tobacco comments:    0.5 PPD 10/08/2023 khj  Substance Use Topics   Alcohol use: Not Currently    Allergies  Allergen Reactions   Bupropion Anxiety    Current Meds  Medication Sig   budesonide -formoterol  (SYMBICORT ) 160-4.5 MCG/ACT inhaler Inhale 2  puffs into the lungs 2 (two) times daily.   cyclobenzaprine  (FLEXERIL ) 5 MG tablet Take 1-2 tablets (5-10 mg total) by mouth daily as needed for muscle spasms.   furosemide (LASIX) 20 MG tablet Take 20 mg by mouth daily.   HYDROcodone -acetaminophen  (NORCO) 10-325 MG tablet Take 1 tablet by mouth every 8 (eight) hours as needed for severe pain (pain score 7-10). Must last 30 days. For chronic pain.   HYDROcodone -acetaminophen  (NORCO) 10-325 MG tablet Take 1 tablet by mouth every 8 (eight) hours as needed for severe pain (pain score 7-10). Must last 30 days. For chronic pain.   [START ON 06/21/2024] HYDROcodone -acetaminophen  (NORCO) 10-325 MG tablet Take 1 tablet by mouth every 8 (eight) hours as needed for severe pain (pain score 7-10). Must last 30 days. For chronic pain.   LYRICA 150 MG capsule 150 mg 2 (two) times daily.    Multiple Vitamin (MULTIVITAMIN WITH MINERALS) TABS tablet Take 1 tablet by mouth daily.   pantoprazole (PROTONIX) 40 MG tablet Take 40 mg by mouth 2 (two) times daily.    sertraline (ZOLOFT) 50 MG tablet 100 mg daily.    simvastatin (ZOCOR) 40 MG tablet 40 mg daily at 6 PM.    traZODone (DESYREL) 50 MG tablet Take 100 mg by mouth at bedtime as needed for sleep.  Immunization History  Administered Date(s) Administered   Fluzone Influenza virus vaccine,trivalent (IIV3), split virus 10/29/2017   Influenza Inj Mdck Quad Pf 09/23/2019, 11/01/2021   Influenza Split 09/30/2016   Influenza, Seasonal, Injecte, Preservative Fre 10/08/2023   Influenza,inj,Quad PF,6+ Mos 09/26/2020   Influenza-Unspecified 10/17/2022   Moderna Sars-Covid-2 Vaccination 04/28/2020, 05/19/2020   Pneumococcal Polysaccharide-23 09/04/2018   Td 09/23/2019        Objective:     BP 120/74 (BP Location: Left Arm, Patient Position: Sitting, Cuff Size: Normal)   Pulse 62   Temp 97.7 F (36.5 C) (Temporal)   Ht 5\' 4"  (1.626 m)   Wt 158 lb 3.2 oz (71.8 kg)   SpO2 96%   BMI 27.15 kg/m   SpO2:  96 %  GENERAL: Well-developed, well-nourished woman, no acute distress. HEAD: Normocephalic, atraumatic.  EYES: Pupils equal, round, reactive to light.  No scleral icterus.  MOUTH: Dentition intact, oral mucosa moist.  No thrush.   NECK: Supple. No thyromegaly. Trachea midline. No JVD.  No adenopathy. PULMONARY: Good air entry bilaterally.  Coarse otherwise, no adventitious sounds.  Increased AP diameter CARDIOVASCULAR: S1 and S2. Regular rate and rhythm.  No rubs, murmurs or gallops heard. ABDOMEN: Benign. MUSCULOSKELETAL: No joint deformity, no clubbing, no edema.  NEUROLOGIC: No overt focal deficit, no gait disturbance, speech is fluent. SKIN: Intact,warm,dry. PSYCH: Mood and behavior normal.   Assessment & Plan:     ICD-10-CM   1. Stage 1 mild COPD by GOLD classification (HCC)  J44.9     2. Respiratory bronchiolitis interstitial lung disease (HCC)  J84.115     3. Lung nodules  R91.8     4. Tobacco dependence due to cigarettes  F17.210      Discussion:    Chronic obstructive pulmonary disease (COPD) Stage one COPD with recent exacerbation, likely triggered by exposure to spray paint and/or seasonal allergies. Symptoms included rhinorrhea, nasal congestion, fever, and dyspnea. Treated with prednisone and amoxicillin at urgent care. Currently asymptomatic with no use of rescue inhaler since wheezing resolved. Continues to smoke but is attempting to reduce to one or two cigarettes daily, with plans for further reduction. - Continue Symbicort  160 mcg 2 puffs twice a day - Use albuterol  as needed for cough - Order chest CT within the next month (09 June 2024) - Follow up in two months  Respiratory bronchiolitis Respiratory bronchiolitis related to smoking. No acute symptoms currently. She is reducing smoking, currently at one or two cigarettes daily, with plans for further reduction. - Continue efforts to reduce smoking     Advised if symptoms do not improve or worsen, to  please contact office for sooner follow up or seek emergency care.    I spent 32 minutes of dedicated to the care of this patient on the date of this encounter to include pre-visit review of records, face-to-face time with the patient discussing conditions above, post visit ordering of testing, clinical documentation with the electronic health record, making appropriate referrals as documented, and communicating necessary findings to members of the patients care team.     C. Chloe Counter, MD Advanced Bronchoscopy PCCM [facility]    *This note was generated using voice recognition software/Dragon and/or AI transcription program.  Despite best efforts to proofread, errors can occur which can change the meaning. Any transcriptional errors that result from this process are unintentional and may not be fully corrected at the time of dictation.

## 2024-05-18 ENCOUNTER — Ambulatory Visit: Attending: Cardiology

## 2024-05-18 DIAGNOSIS — I3139 Other pericardial effusion (noninflammatory): Secondary | ICD-10-CM | POA: Diagnosis not present

## 2024-05-18 LAB — ECHOCARDIOGRAM COMPLETE
AR max vel: 1.61 cm2
AV Area VTI: 1.6 cm2
AV Area mean vel: 1.55 cm2
AV Mean grad: 5 mmHg
AV Peak grad: 9.5 mmHg
Ao pk vel: 1.54 m/s
Area-P 1/2: 3.6 cm2
Calc EF: 66 %
S' Lateral: 4 cm
Single Plane A2C EF: 64.3 %
Single Plane A4C EF: 67.5 %

## 2024-05-20 ENCOUNTER — Ambulatory Visit: Payer: Self-pay | Admitting: Cardiology

## 2024-05-30 ENCOUNTER — Encounter: Payer: Self-pay | Admitting: Pulmonary Disease

## 2024-06-04 ENCOUNTER — Ambulatory Visit: Admitting: Cardiology

## 2024-06-09 ENCOUNTER — Ambulatory Visit

## 2024-07-14 ENCOUNTER — Encounter: Payer: Self-pay | Admitting: Nurse Practitioner

## 2024-07-14 ENCOUNTER — Ambulatory Visit: Attending: Nurse Practitioner | Admitting: Nurse Practitioner

## 2024-07-14 VITALS — BP 110/72 | HR 61 | Temp 98.2°F | Resp 16 | Ht 64.0 in | Wt 151.0 lb

## 2024-07-14 DIAGNOSIS — M542 Cervicalgia: Secondary | ICD-10-CM | POA: Diagnosis present

## 2024-07-14 DIAGNOSIS — M47816 Spondylosis without myelopathy or radiculopathy, lumbar region: Secondary | ICD-10-CM | POA: Diagnosis present

## 2024-07-14 DIAGNOSIS — G8929 Other chronic pain: Secondary | ICD-10-CM | POA: Insufficient documentation

## 2024-07-14 DIAGNOSIS — M4726 Other spondylosis with radiculopathy, lumbar region: Secondary | ICD-10-CM | POA: Diagnosis not present

## 2024-07-14 DIAGNOSIS — M533 Sacrococcygeal disorders, not elsewhere classified: Secondary | ICD-10-CM | POA: Insufficient documentation

## 2024-07-14 DIAGNOSIS — M546 Pain in thoracic spine: Secondary | ICD-10-CM | POA: Insufficient documentation

## 2024-07-14 DIAGNOSIS — Z79899 Other long term (current) drug therapy: Secondary | ICD-10-CM | POA: Diagnosis present

## 2024-07-14 DIAGNOSIS — M5416 Radiculopathy, lumbar region: Secondary | ICD-10-CM | POA: Insufficient documentation

## 2024-07-14 DIAGNOSIS — G894 Chronic pain syndrome: Secondary | ICD-10-CM | POA: Insufficient documentation

## 2024-07-14 MED ORDER — HYDROCODONE-ACETAMINOPHEN 10-325 MG PO TABS: 1.0000 | ORAL_TABLET | Freq: Three times a day (TID) | ORAL | 0 refills | Status: DC | PRN

## 2024-07-14 MED ORDER — PREDNISONE 20 MG PO TABS: ORAL_TABLET | ORAL | 0 refills | Status: AC

## 2024-07-14 NOTE — Progress Notes (Signed)
 Nursing Pain Medication Assessment:  Safety precautions to be maintained throughout the outpatient stay will include: orient to surroundings, keep bed in low position, maintain call bell within reach at all times, provide assistance with transfer out of bed and ambulation.  Medication Inspection Compliance: Pill count conducted under aseptic conditions, in front of the patient. Neither the pills nor the bottle was removed from the patient's sight at any time. Once count was completed pills were immediately returned to the patient in their original bottle.  Medication: Hydrocodone /APAP Pill/Patch Count: 29 of 90 pills/patches remain Pill/Patch Appearance: Markings consistent with prescribed medication Bottle Appearance: Standard pharmacy container. Clearly labeled. Filled Date: 06 / 21 / 2025 Last Medication intake:  Today

## 2024-07-14 NOTE — Progress Notes (Signed)
 PROVIDER NOTE: Interpretation of information contained herein should be left to medically-trained personnel. Specific patient instructions are provided elsewhere under Patient Instructions section of medical record. This document was created in part using AI and STT-dictation technology, any transcriptional errors that may result from this process are unintentional.  Patient: Vanessa Greer  Service: E/M   PCP: Fernande Ophelia JINNY DOUGLAS, MD  DOB: 1961-11-05  DOS: 07/14/2024  Provider: Emmy MARLA Blanch, NP  MRN: [id number]  Delivery: Face-to-face  Specialty: Interventional Pain Management  Type: Established Patient  Setting: Ambulatory outpatient facility  Specialty designation: 09  Referring Prov.: Fernande Ophelia JINNY DOUGLAS, MD  Location: Outpatient office facility       History of present illness (HPI) Vanessa Greer, a 63 y.o. year old female, is here today because of her Chronic pain syndrome [G89.4]. Vanessa Greer primary complain today is Back Pain  Pertinent problems: Vanessa Greer has Chronic bilateral low back pain with bilateral sciatica; Lumbar facet arthropathy; Lumbar spondylosis; Lumbar degenerative disc disease; Chronic pain syndrome; and Coccyodynia on their pertinent problem list.   Pain Assessment: Severity of Chronic pain is reported as a 4 /10. Location: Back Lower/Radiates to buttoks at times. Onset: More than a month ago. Quality: Aching, Burning. Timing: Constant. Modifying factor(s): Medication, heat. Vitals:  height is 5' 4 (1.626 m) and weight is 151 lb (68.5 kg). Her temperature is 98.2 F (36.8 C). Her blood pressure is 110/72 and her pulse is 61. Her respiration is 16 and oxygen saturation is 99%.  BMI: Estimated body mass index is 25.92 kg/m as calculated from the following:   Height as of this encounter: 5' 4 (1.626 m).   Weight as of this encounter: 151 lb (68.5 kg).  Last encounter: 04/22/2019 Last procedure: 07/18/2022  Reason for encounter: medication management. No change  in medical history since last visit.  Patient's pain is at baseline.  Patient continues multimodal pain regimen as prescribed.  States that it provides pain relief and improvement in functional status. The patient complains of bilateral low back pain, with radiation to the right buttock and down the posterior aspect of the right thigh, suggestive of sciatic nerve involvement.  She also complains of left knee pain which is chronic in nature, she received a knee injection at the urgent care in May 2025.  The patient reports that her husband passed away 3 weeks ago, and she is currently in the process of adjusting and coping with the loss. Pharmacotherapy Assessment   Hydrocodone -acetaminophen  (Norco) 10-325 mg tablet every 8 hours as needed for pain. MME=30  Prednisone  taper dose for 9 days Monitoring: [place] PMP: PDMP reviewed during this encounter.       Pharmacotherapy: No side-effects or adverse reactions reported. Compliance: No problems identified. Effectiveness: Clinically acceptable.  Vanessa Greer, [place]  07/14/2024  9:01 AM  Sign when Signing Visit Nursing Pain Medication Assessment:  Safety precautions to be maintained throughout the outpatient stay will include: orient to surroundings, keep bed in low position, maintain call bell within reach at all times, provide assistance with transfer out of bed and ambulation.  Medication Inspection Compliance: Pill count conducted under aseptic conditions, in front of the patient. Neither the pills nor the bottle was removed from the patient's sight at any time. Once count was completed pills were immediately returned to the patient in their original bottle.  Medication: Hydrocodone /APAP Pill/Patch Count: 29 of 90 pills/patches remain Pill/Patch Appearance: Markings consistent with prescribed medication Bottle Appearance: Standard pharmacy container.  Clearly labeled. Filled Date: 06 / 21 / 2025 Last Medication intake:  Today    UDS:  Summary  Date  Value Ref Range Status  09/03/2023 Note  Final    Comment:    ==================================================================== [employer] Select 13 (MW) ==================================================================== Specimen Alert Note: Urinary creatinine is low; ability to detect some drugs may be compromised. Interpret results with caution. (Creatinine) ==================================================================== Test                             Result       Flag       Units  Drug Present and Declared for Prescription Verification   Hydrocodone                     2605         EXPECTED   ng/mg creat   Hydromorphone                  695          EXPECTED   ng/mg creat   Dihydrocodeine                 400          EXPECTED   ng/mg creat   Norhydrocodone                 8000         EXPECTED   ng/mg creat    Sources of hydrocodone  include scheduled prescription medications.    Hydromorphone, dihydrocodeine and norhydrocodone are expected    metabolites of hydrocodone . Hydromorphone and dihydrocodeine are    also available as scheduled prescription medications.  Drug Absent but Declared for Prescription Verification   Buprenorphine                   Not Detected UNEXPECTED ng/mg creat    Low dose buprenorphine  is not always detected even when used as    directed.  ==================================================================== Test                      Result    Flag   Units      Ref Range   Creatinine              19        LL     mg/dL      >=79 ==================================================================== Declared Medications:  The flagging and interpretation on this report are based on the  following declared medications.  Unexpected results may arise from  inaccuracies in the declared medications.   **Note: The testing scope of this panel includes these medications:   Hydrocodone  (Norco)   **Note: The testing scope of this panel does not include  small to  moderate amounts of these reported medications:   Buprenorphine  (Belbuca )   **Note: The testing scope of this panel does not include the  following reported medications:   Acetaminophen  (Norco)  Budesonide  (Symbicort )  Cyclobenzaprine  (Flexeril )  Diclofenac  (Voltaren )  Formoterol  (Symbicort )  Furosemide (Lasix)  Multivitamin  Pantoprazole (Protonix)  Pregabalin (Lyrica)  Sertraline (Zoloft)  Simvastatin (Zocor)  Trazodone (Desyrel) ==================================================================== For clinical consultation, please call [phone]. ====================================================================     No results found for: CBDTHCR No results found for: D8THCCBX No results found for: D9THCCBX  ROS  Constitutional: Denies any fever or chills Gastrointestinal: No reported hemesis, hematochezia, vomiting, or acute GI distress Musculoskeletal:  low back pain bilateral (R>L),  right hip pain radiate to back of the thigh, left knee pain Neurological: No reported episodes of acute onset apraxia, aphasia, dysarthria, agnosia, amnesia, paralysis, loss of coordination, or loss of consciousness  Medication Review  HYDROcodone -acetaminophen , budesonide -formoterol , cyclobenzaprine , furosemide, multivitamin with minerals, pantoprazole, predniSONE , pregabalin, sertraline, simvastatin, and traZODone  History Review  Allergy: Ms. Vise is allergic to bupropion. Drug: Ms. Guyette  reports no history of drug use. Alcohol:  reports that she does not currently use alcohol. Tobacco:  reports that she has been smoking cigarettes. She started smoking about 46 years ago. She has a 46.5 pack-year smoking history. She has never used smokeless tobacco. Social: Ms. Niehoff  reports that she has been smoking cigarettes. She started smoking about 46 years ago. She has a 46.5 pack-year smoking history. She has never used smokeless tobacco. She reports that she does  not currently use alcohol. She reports that she does not use drugs. Medical:  has a past medical history of Anxiety, Back pain, DDD (degenerative disc disease), lumbar, Depression, GERD (gastroesophageal reflux disease), High cholesterol, Lumbar radiculitis, Pneumothorax (1982), and Sleep apnea. Surgical: Ms. Knobloch  has a past surgical history that includes Abdominal hysterectomy; Foot surgery; Ectopic pregnancy surgery; Colonoscopy; Laser ablation condolamata (N/A, 12/13/2017); Esophagogastroduodenoscopy (egd) with propofol  (N/A, 03/17/2019); and Trigger finger release (Right). Family: family history includes Breast cancer (age of onset: 49) in her mother; Heart disease in her father.  Laboratory Chemistry Profile   Renal No results found for: BUN, CREATININE, LABCREA, BCR, GFR, GFRAA, GFRNONAA, LABVMA, EPIRU, EPINEPH24HUR, NOREPRU, NOREPI24HUR, DOPARU, [id number]  Hepatic No results found for: AST, ALT, ALBUMIN, ALKPHOS, HCVAB, AMYLASE, LIPASE, AMMONIA  Electrolytes No results found for: NA, K, CL, CALCIUM, MG, PHOS  Bone No results found for: VD25OH, [id number], CI6874NY7, CI7874NY7, 25OHVITD1, 25OHVITD2, 25OHVITD3, TESTOFREE, TESTOSTERONE  Inflammation (CRP: Acute Phase) (ESR: Chronic Phase) No results found for: CRP, ESRSEDRATE, LATICACIDVEN       Note: Above Lab results reviewed.  Recent Imaging Review  ECHOCARDIOGRAM COMPLETE    ECHOCARDIOGRAM REPORT       Patient Name:   KODEE DRURY Date of Exam: 05/18/2024 Medical Rec #:  [id number]       Height:       64.0 in Accession #:    [id number]      Weight:       158.2 lb Date of Birth:  15-Dec-1961        BSA:          1.771 m Patient Age:    62 years        BP:           120/74 mmHg Patient Gender: F               HR:           65 bpm. Exam Location:  [facility]  Procedure: 2D Echo, Cardiac Doppler and Color Doppler (Both Spectral and Color             Flow Doppler were utilized during procedure).  Indications:    I31.3 Pericardial effusion (noninflammatory)   History:        Patient has prior history of Echocardiogram examinations.   Sonographer:    Alm Minerva Referring Phys: [id number] BRIAN AGBOR-ETANG  IMPRESSIONS   1. Left ventricular ejection fraction, by estimation, is 55 to 60%. The left ventricle has normal function. The left ventricle has no regional wall motion abnormalities. Left ventricular diastolic parameters were normal.  2. Right ventricular systolic function  is normal. The right ventricular size is normal.  3. Left atrial size was mildly dilated.  4. Right atrial size was mildly dilated.  5. The mitral valve is normal in structure. Mild mitral valve regurgitation.  6. The aortic valve is tricuspid. Aortic valve regurgitation is not visualized.  7. The inferior vena cava is normal in size with greater than 50% respiratory variability, suggesting right atrial pressure of 3 mmHg.  FINDINGS  Left Ventricle: Left ventricular ejection fraction, by estimation, is 55 to 60%. The left ventricle has normal function. The left ventricle has no regional wall motion abnormalities. The left ventricular internal cavity size was normal in size. There is  no left ventricular hypertrophy. Left ventricular diastolic parameters were normal.  Right Ventricle: The right ventricular size is normal. No increase in right ventricular wall thickness. Right ventricular systolic function is normal.  Left Atrium: Left atrial size was mildly dilated.  Right Atrium: Right atrial size was mildly dilated.  Pericardium: Trivial pericardial effusion is present.  Mitral Valve: The mitral valve is normal in structure. Mild mitral valve regurgitation.  Tricuspid Valve: The tricuspid valve is normal in structure. Tricuspid valve regurgitation is trivial.  Aortic Valve: The aortic valve is tricuspid. Aortic valve regurgitation is not visualized.  Aortic valve mean gradient measures 5.0 mmHg. Aortic valve peak gradient measures 9.5 mmHg. Aortic valve area, by VTI measures 1.60 cm.  Pulmonic Valve: The pulmonic valve was normal in structure. Pulmonic valve regurgitation is not visualized.  Aorta: The aortic root and ascending aorta are structurally normal, with no evidence of dilitation.  Venous: The inferior vena cava is normal in size with greater than 50% respiratory variability, suggesting right atrial pressure of 3 mmHg.  IAS/Shunts: No atrial level shunt detected by color flow Doppler.    LEFT VENTRICLE PLAX 2D LVIDd:         5.40 cm     Diastology LVIDs:         4.00 cm     LV e' medial:    8.16 cm/s LV PW:         1.10 cm     LV E/e' medial:  12.7 LV IVS:        1.00 cm     LV e' lateral:   7.29 cm/s LVOT diam:     2.00 cm     LV E/e' lateral: 14.3 LV SV:         61 LV SV Index:   34 LVOT Area:     3.14 cm   LV Volumes (MOD) LV vol d, MOD A2C: 62.7 ml LV vol d, MOD A4C: 85.6 ml LV vol s, MOD A2C: 22.4 ml LV vol s, MOD A4C: 27.8 ml LV SV MOD A2C:     40.3 ml LV SV MOD A4C:     85.6 ml LV SV MOD BP:      49.0 ml  RIGHT VENTRICLE            IVC RV Basal diam:  3.90 cm    IVC diam: 2.00 cm RV Mid diam:    2.90 cm RV S prime:     9.79 cm/s TAPSE (M-mode): 1.6 cm  LEFT ATRIUM             Index        RIGHT ATRIUM           Index LA Vol (A2C):   81.2 ml 45.85 ml/m  RA Area:  21.20 cm LA Vol (A4C):   61.6 ml 34.79 ml/m  RA Volume:   62.80 ml  35.46 ml/m LA Biplane Vol: 71.5 ml 40.38 ml/m  AORTIC VALVE AV Area (Vmax):    1.61 cm AV Area (Vmean):   1.55 cm AV Area (VTI):     1.60 cm AV Vmax:           154.00 cm/s AV Vmean:          103.000 cm/s AV VTI:            0.379 m AV Peak Grad:      9.5 mmHg AV Mean Grad:      5.0 mmHg LVOT Vmax:         79.10 cm/s LVOT Vmean:        50.800 cm/s LVOT VTI:          0.193 m LVOT/AV VTI ratio: 0.51   AORTA Ao Sinus diam: 2.90 cm Ao STJ diam:   2.3 cm Ao  Asc diam:   2.70 cm  MITRAL VALVE MV Area (PHT): 3.60 cm     SHUNTS MV Decel Time: 211 msec     Systemic VTI:  0.19 m MV E velocity: 104.00 cm/s  Systemic Diam: 2.00 cm MV A velocity: 113.00 cm/s MV E/A ratio:  0.92  Redell Cave MD Electronically signed by Redell Cave MD Signature Date/Time: 05/18/2024/1:47:44 PM      Final   Note: Reviewed        Physical Exam  Vitals: BP 110/72   Pulse 61   Temp 98.2 F (36.8 C)   Resp 16   Ht 5' 4 (1.626 m)   Wt 151 lb (68.5 kg)   SpO2 99%   BMI 25.92 kg/m  BMI: Estimated body mass index is 25.92 kg/m as calculated from the following:   Height as of this encounter: 5' 4 (1.626 m).   Weight as of this encounter: 151 lb (68.5 kg). Ideal: Ideal body weight: 54.7 kg (120 lb 9.5 oz) Adjusted ideal body weight: 60.2 kg (132 lb 12.1 oz) General appearance: Well nourished, well developed, and well hydrated. In no apparent acute distress Mental status: Alert, oriented x 3 (person, place, & time)       Respiratory: No evidence of acute respiratory distress Eyes: PERLA   Assessment   Diagnosis Status  1. Chronic pain syndrome   2. Lumbar radiculopathy   3. Chronic bilateral thoracic back pain   4. Lumbar spondylosis   5. Sacroiliac joint pain   6. Medication management   7. Cervicalgia    Controlled Controlled Controlled   Updated Problems: No problems updated.  Plan of Care  Problem-specific:  Assessment and Plan We will continue on current medication regimen.  Prescribing drug monitoring (PDMP) reviewed; findings consistent with the use of prescribed medication and no evidence of narcotic misuse or abuse.  Urine drug screening (UDS) up to date.  No other new issues or problems reported to this visit.  Schedule follow-up in 90 days for medication management.  It was recommended to consider interventional therapies as a part of comprehensive approach to managing chronic pain, in conjunction with the current medication  regimen.    Ms. JANALYNN EDER has a current medication list which includes the following long-term medication(s): budesonide -formoterol , furosemide, lyrica, sertraline, simvastatin, trazodone, hydrocodone -acetaminophen , [START ON 08/13/2024] hydrocodone -acetaminophen , and [START ON 09/12/2024] hydrocodone -acetaminophen .  Pharmacotherapy (Medications Ordered): Meds ordered this encounter  Medications   HYDROcodone -acetaminophen  (NORCO) 10-325 MG tablet  Sig: Take 1 tablet by mouth every 8 (eight) hours as needed for severe pain (pain score 7-10). Must last 30 days. For chronic pain.    Dispense:  90 tablet    Refill:  0    [place] STOP ACT - Not applicable. Fill one day early if pharmacy is closed on scheduled refill date.   HYDROcodone -acetaminophen  (NORCO) 10-325 MG tablet    Sig: Take 1 tablet by mouth every 8 (eight) hours as needed for severe pain (pain score 7-10). Must last 30 days. For chronic pain.    Dispense:  90 tablet    Refill:  0    [place] STOP ACT - Not applicable. Fill one day early if pharmacy is closed on scheduled refill date.   HYDROcodone -acetaminophen  (NORCO) 10-325 MG tablet    Sig: Take 1 tablet by mouth every 8 (eight) hours as needed for severe pain (pain score 7-10). Must last 30 days. For chronic pain.    Dispense:  90 tablet    Refill:  0    [place] STOP ACT - Not applicable. Fill one day early if pharmacy is closed on scheduled refill date.   predniSONE  (DELTASONE ) 20 MG tablet    Sig: Take 3 tablets (60 mg total) by mouth daily with breakfast for 3 days, THEN 2 tablets (40 mg total) daily with breakfast for 3 days, THEN 1 tablet (20 mg total) daily with breakfast for 3 days.    Dispense:  18 tablet    Refill:  0   Orders:  No orders of the defined types were placed in this encounter.       Return in about 3 months (around 10/14/2024) for (F2F), (MM), Emmy Blanch NP.    Recent Visits Date Type Provider Dept  04/21/24 Office Visit Marcelino Nurse, MD [facility]  Showing recent visits within past 90 days and meeting all other requirements Today's Visits Date Type Provider Dept  07/14/24 Office Visit Naomie Crow K, NP [facility]  Showing today's visits and meeting all other requirements Future Appointments Date Type Provider Dept  10/06/24 Appointment Lilas Diefendorf K, NP [facility]  Showing future appointments within next 90 days and meeting all other requirements  I discussed the assessment and treatment plan with the patient. The patient was provided an opportunity to ask questions and all were answered. The patient agreed with the plan and demonstrated an understanding of the instructions.  Patient advised to call back or seek an in-person evaluation if the symptoms or condition worsens.  Duration of encounter: 30 minutes.  Total time on encounter, as per AMA guidelines included both the face-to-face and non-face-to-face time personally spent by the physician and/or other qualified health care professional(s) on the day of the encounter (includes time in activities that require the physician or other qualified health care professional and does not include time in activities normally performed by clinical staff). Physician's time may include the following activities when performed: Preparing to see the patient (e.g., pre-charting review of records, searching for previously ordered imaging, lab work, and nerve conduction tests) Review of prior analgesic pharmacotherapies. Reviewing PMP Interpreting ordered tests (e.g., lab work, imaging, nerve conduction tests) Performing post-procedure evaluations, including interpretation of diagnostic procedures Obtaining and/or reviewing separately obtained history Performing a medically appropriate examination and/or evaluation Counseling and educating the patient/family/caregiver Ordering medications, tests, or procedures Referring and communicating with other health care  professionals (when not separately reported) Documenting clinical information in the electronic or other health  record Independently interpreting results (not separately reported) and communicating results to the patient/ family/caregiver Care coordination (not separately reported)  Note by: Iokepa Geffre K Quintasia Theroux, NP (TTS and AI technology used. I apologize for any typographical errors that were not detected and corrected.) Date: 07/14/2024; Time: 9:25 AM

## 2024-07-15 ENCOUNTER — Ambulatory Visit: Admitting: Pulmonary Disease

## 2024-08-13 ENCOUNTER — Other Ambulatory Visit: Payer: Self-pay | Admitting: Pulmonary Disease

## 2024-08-13 ENCOUNTER — Ambulatory Visit
Admission: RE | Admit: 2024-08-13 | Discharge: 2024-08-13 | Disposition: A | Discharge status: Home / Self Care | Source: Ambulatory Visit | Attending: Pulmonary Disease | Admitting: Pulmonary Disease

## 2024-08-13 DIAGNOSIS — R918 Other nonspecific abnormal finding of lung field: Secondary | ICD-10-CM

## 2024-08-29 ENCOUNTER — Ambulatory Visit: Payer: Self-pay | Admitting: Pulmonary Disease

## 2024-09-03 ENCOUNTER — Encounter: Payer: Self-pay | Admitting: Pulmonary Disease

## 2024-09-03 ENCOUNTER — Ambulatory Visit: Admitting: Pulmonary Disease

## 2024-09-03 VITALS — BP 130/80 | HR 67 | Temp 98.3°F | Ht 64.0 in | Wt 151.0 lb

## 2024-09-03 DIAGNOSIS — Z23 Encounter for immunization: Secondary | ICD-10-CM | POA: Diagnosis not present

## 2024-09-03 DIAGNOSIS — R918 Other nonspecific abnormal finding of lung field: Secondary | ICD-10-CM | POA: Diagnosis not present

## 2024-09-03 DIAGNOSIS — J449 Chronic obstructive pulmonary disease, unspecified: Secondary | ICD-10-CM

## 2024-09-03 DIAGNOSIS — J84115 Respiratory bronchiolitis interstitial lung disease: Secondary | ICD-10-CM | POA: Diagnosis not present

## 2024-09-03 DIAGNOSIS — F17210 Nicotine dependence, cigarettes, uncomplicated: Secondary | ICD-10-CM | POA: Diagnosis not present

## 2024-09-03 MED ORDER — BUDESONIDE-FORMOTEROL FUMARATE 160-4.5 MCG/ACT IN AERO: 2.0000 | INHALATION_SPRAY | Freq: Two times a day (BID) | RESPIRATORY_TRACT | 11 refills | Status: AC

## 2024-09-03 NOTE — Patient Instructions (Signed)
 VISIT SUMMARY:  During your visit, we discussed your COPD, lung nodules, knee pain, and smoking habits. You mentioned that your breathing is good and that you are using Symbicort  effectively. We also talked about your knee pain and the emotional challenges you have faced recently.  YOUR PLAN:  -CHRONIC OBSTRUCTIVE PULMONARY DISEASE (COPD): COPD is a chronic lung condition that makes it hard to breathe. You should continue using Symbicort  and contact the clinic if you experience any flare-ups.  -PULMONARY NODULES: Pulmonary nodules are small growths in the lungs, often caused by smoking. Your recent scan showed no significant changes, so we will monitor them annually. Please schedule a follow-up CT scan in August next year.  -OSTEOARTHRITIS OF KNEE: Osteoarthritis of the knee is a condition where the cartilage in your knee wears down, causing pain and limiting activity. We discussed non-surgical management options, including exercises to strengthen your quadriceps and hamstrings, warming up before exercise, icing after exercise, and considering an anti-inflammatory diet.  -OSTEOARTHRITIS OF THORACIC SPINE: Osteoarthritis of the thoracic spine is a condition where the cartilage in your spine wears down, causing pain. We discussed managing this with lifestyle changes, including an anti-inflammatory diet.  -TOBACCO USE DISORDER: Tobacco use disorder is a condition where you are dependent on smoking. Smoking affects your lung health and overall well-being. We discussed the impact of smoking and encouraged you to quit.  INSTRUCTIONS:  Please schedule a follow-up CT scan for your pulmonary nodules in August next year. Continue using Symbicort  for your COPD and contact the clinic if you have any flare-ups. Consider the recommended exercises and dietary changes for managing your osteoarthritis. We strongly encourage you to quit smoking to improve your overall health.  We have provided you with flu vaccine  today.

## 2024-09-03 NOTE — Progress Notes (Signed)
 Subjective:    Patient ID: Vanessa Greer, female    DOB: 01/05/1961, 63 y.o.   MRN: [id number]  Patient Care Team: Fernande Ophelia JINNY DOUGLAS, MD as PCP - General (Internal Medicine) Darliss Rogue, MD as PCP - Cardiology (Cardiology) Sheldon Standing, MD as Consulting Physician (Colon and Rectal Surgery) Gaylyn Gladis PENNER, MD (Inactive) as Consulting Physician (Gastroenterology) Tricia, Tawni CROME, MD as Referring Physician (Dermatology) Schermerhorn, Debby JINNY, MD as Referring Physician (Obstetrics and Gynecology)  Chief Complaint  Patient presents with   COPD    No breathing problems.     BACKGROUND/INTERVAL: Vanessa Greer is a 63 year old with a 41-pack-year history of smoking and a medical history as noted below who presents for follow-up of normal lung cancer screening CT. I initially saw the patient on 26 June 2023 for the details of that visit please refer to that note.  Recall that the patient had low-dose lung cancer screening CT on 03 June 2023.  This was classified as a lung RADS 4A, suspicious.  It notes centrilobular and paraseptal emphysema, smoking-related respiratory bronchiolitis, waxing and waning centrilobular nodularity in the upper and mid lung zones with overall progression from 01 March 2023.  Some of the findings are largely new from the March CT. she had follow-up CT on 13 September 2023 and this showed significant waxing and waning of pulmonary nodularity with the largest new nodule being 5.5 mm in the left upper lobe.  The findings are more likely due to smokers bronchiolitis/RB ILD.  She is considered a poor candidate for lung cancer screening due to the waxing and waning nature of her nodularity.  She follows here today after her visit of 12 May 2024.  Last CT chest was on 13 August 2024.  HPI Discussed the use of AI scribe software for clinical note transcription with the patient, who gave verbal consent to proceed.  History of Present Illness   Vanessa Greer is a 63 year old female with COPD and lung nodules who presents for follow-up.  She reports that her breathing is good.  We discussed her results of her last CT chest performed 13 August 2024. She continues to smoke about half a pack of cigarettes per day, with some variation. She uses Symbicort , which she finds effective, and requires a refill.  She is experiencing left knee pain that limits her physical activity. She has received four cortisone injections over the past year, with the most recent in early August, but continues to experience pain. This affects her ability to stay active, and she is concerned about the possibility of needing surgery.  She recently retired in April and experienced the passing of her husband in June, which has been emotionally challenging. She is trying to adjust to these changes and find her way forward.      DATA 06/03/2023 LDCT chest: Waxing and waning upper and midlung zone predominant centrilobular nodularity progressive from 01 March 2023 and largely new from 28 February 2022.  Possibility of infectious/inflammatory etiology such as sarcoid is entertained.  Malignancy cannot be excluded.  Exam is categorized as lung RADS 4A 07/17/2023 echocardiogram: LVEF 55 to 60%, LV normal function.  Grade 1 DD.  RV systolic function normal.  Normal pulmonary artery systolic pressure.  Small to moderate pericardial effusion, no evidence of cardiac tamponade.  Mild mitral regurgitation. 08/06/2023 PFTs: FEV1 2.27 L or 89% predicted, FVC 2.81 L or 85% predicted, FEV1 FVC 81%, lung volumes normal, diffusion capacity normal, overall, normal  study. 09/13/2023 LDCT chest: Significant waxing and waning pulmonary nodularity with the largest nodule measuring 5.5 mm in the upper lobe.  Findings likely due to smoking-related respiratory bronchiolitis.  Examination is categorized as lung RADS 3, short-term follow-up in 6 months recommended.  Emphysema also noted. 08/13/2024 LDCT chest:  Mild paraseptal and centrilobular emphysema with diffuse bronchial wall thickening.  Previously visualized pulmonary nodules are either stable or resolved.  No significant growth of any of the previous nodules largest remaining of which is 3.9 mm in the subpleural anterior lingula.  No additional new significant pulmonary nodules.  Lung RADS 2, benign appearance or behavior.  Stable right adrenal adenoma with no need for further imaging.  Review of Systems A 10 point review of systems was performed and it is as noted above otherwise negative.   Patient Active Problem List   Diagnosis Date Noted   Lumbar radiculopathy 04/21/2024   Medication management 04/21/2024   Chronic bilateral thoracic back pain 06/11/2023   Cervicalgia 06/11/2023   Bilateral primary osteoarthritis of knee 12/18/2022   Coccyodynia 02/25/2020   Lumbar facet arthropathy 10/02/2018   Lumbar spondylosis 10/02/2018   Lumbar degenerative disc disease 10/02/2018   Chronic pain syndrome 10/02/2018   Anal condylomata s/p laser ablation 12/13/2017 12/13/2017   Prolapsed internal hemorrhoids, grade 3, s/p ligation/pexy 12/13/2017 12/13/2017   Chronic bilateral low back pain with bilateral sciatica 01/29/2017   GERD without esophagitis 01/28/2017    Social History   Tobacco Use   Smoking status: Every Day    Current packs/day: 1.00    Average packs/day: 1 pack/day for 46.7 years (46.7 ttl pk-yrs)    Types: Cigarettes    Start date: 1979   Smokeless tobacco: Never   Tobacco comments:    0.5 PPD 10/08/2023 khj  Substance Use Topics   Alcohol use: Not Currently    Allergies  Allergen Reactions   Bupropion Anxiety    Current Meds  Medication Sig   cyclobenzaprine  (FLEXERIL ) 5 MG tablet Take 1-2 tablets (5-10 mg total) by mouth daily as needed for muscle spasms.   furosemide (LASIX) 20 MG tablet Take 20 mg by mouth daily.   HYDROcodone -acetaminophen  (NORCO) 10-325 MG tablet Take 1 tablet by mouth every 8 (eight)  hours as needed for severe pain (pain score 7-10). Must last 30 days. For chronic pain.   HYDROcodone -acetaminophen  (NORCO) 10-325 MG tablet Take 1 tablet by mouth every 8 (eight) hours as needed for severe pain (pain score 7-10). Must last 30 days. For chronic pain.   [START ON 09/12/2024] HYDROcodone -acetaminophen  (NORCO) 10-325 MG tablet Take 1 tablet by mouth every 8 (eight) hours as needed for severe pain (pain score 7-10). Must last 30 days. For chronic pain.   LYRICA 150 MG capsule 150 mg 2 (two) times daily.    Multiple Vitamin (MULTIVITAMIN WITH MINERALS) TABS tablet Take 1 tablet by mouth daily.   pantoprazole (PROTONIX) 40 MG tablet Take 40 mg by mouth 2 (two) times daily.    sertraline (ZOLOFT) 50 MG tablet 100 mg daily.    simvastatin (ZOCOR) 40 MG tablet 40 mg daily at 6 PM.    traZODone (DESYREL) 50 MG tablet Take 100 mg by mouth at bedtime as needed for sleep.    [DISCONTINUED] budesonide -formoterol  (SYMBICORT ) 160-4.5 MCG/ACT inhaler Inhale 2 puffs into the lungs 2 (two) times daily.    Immunization History  Administered Date(s) Administered   Fluzone Influenza virus vaccine,trivalent (IIV3), split virus 10/29/2017   Influenza Inj Mdck Quad Pf 09/23/2019,  11/01/2021   Influenza Split 09/30/2016   Influenza, Seasonal, Injecte, Preservative Fre 10/08/2023   Influenza,inj,Quad PF,6+ Mos 09/26/2020   Influenza-Unspecified 10/17/2022   Moderna Sars-Covid-2 Vaccination 04/28/2020, 05/19/2020   Pneumococcal Polysaccharide-23 09/04/2018   Td 09/23/2019        Objective:     BP 130/80   Pulse 67   Temp 98.3 F (36.8 C) (Oral)   Ht 5' 4 (1.626 m)   Wt 151 lb (68.5 kg)   SpO2 100%   BMI 25.92 kg/m   SpO2: 100 %  GENERAL: Well-developed, well-nourished woman, no acute distress. HEAD: Normocephalic, atraumatic.  EYES: Pupils equal, round, reactive to light.  No scleral icterus.  MOUTH: Dentition intact, oral mucosa moist.  No thrush.   NECK: Supple. No thyromegaly.  Trachea midline. No JVD.  No adenopathy. PULMONARY: Good air entry bilaterally.  Coarse otherwise, no adventitious sounds.  Mildly increased AP diameter CARDIOVASCULAR: S1 and S2. Regular rate and rhythm.  No rubs, murmurs or gallops heard. ABDOMEN: Benign. MUSCULOSKELETAL: No joint deformity, no clubbing, no edema.  NEUROLOGIC: No overt focal deficit, no gait disturbance, speech is fluent. SKIN: Intact,warm,dry. PSYCH: Mood and behavior normal.  Independent reviewed 14 August LDCT chest and discussed results with patient.      Assessment & Plan:     ICD-10-CM   1. Stage 1 mild COPD by GOLD classification (HCC)  J44.9     2. Respiratory bronchiolitis interstitial lung disease (HCC)  J84.115     3. Lung nodules  R91.8     4. Tobacco dependence due to cigarettes  F17.210     5. Need for influenza vaccination  Z23      Orders Placed This Encounter  Procedures   Flu vaccine trivalent PF, 6mos and older(Flulaval,Afluria,Fluarix,Fluzone)   Meds ordered this encounter  Medications   budesonide -formoterol  (SYMBICORT ) 160-4.5 MCG/ACT inhaler    Sig: Inhale 2 puffs into the lungs 2 (two) times daily.    Dispense:  10.2 g    Refill:  11   Discussion:    Chronic obstructive pulmonary disease (COPD) Continue Symbicort  and provide refill Advise to contact clinic in case of flare-up  Pulmonary nodules Pulmonary nodules are stable with no significant changes on recent scan. Likely reactive to cigarette smoke. Annual monitoring recommended due to smoking history. - Schedule follow-up CT scan in August next year  Osteoarthritis of knee Osteoarthritis of the knee causes significant pain and limits activity. Recent cortisone injection in August provided temporary relief, but pain has returned, indicating ongoing inflammation and instability. Non-surgical management options discussed, including exercise and diet. - Recommend exercises to strengthen quadriceps and hamstrings - Advise  warm-up before exercise and icing after - Discuss potential benefits of anti-inflammatory diet  Osteoarthritis of thoracic spine Osteoarthritis of the thoracic spine present. Pain management and lifestyle modifications discussed. - Discuss anti-inflammatory diet as a potential management strategy  Tobacco use disorder Continues to smoke approximately half a pack per day. Smoking contributes to pulmonary nodules and overall health issues. Impact of smoking on blood supply and tissue health discussed. - Encourage smoking cessation   Need for influenza vaccination Patient received flu vaccine today.  Smoking/Tobacco Cessation Counseling Vanessa Greer is a current user of tobacco or nicotine products. She is not ready to quit at this time. Counseling provided today addressed the risks of continued use and the benefits of cessation. Discussed tobacco/nicotine use history, readiness to quit, and evidence-based treatment options including behavioral strategies, support resources, and pharmacologic therapies. Provided  encouragement and educational materials on steps and resources to quit smoking. Patient questions were addressed, and follow-up recommended for continued support. Total time spent on counseling: 5 minutes.  .  Will see the patient in follow-up in 6 months time she is to contact us  prior to that time should any new difficulties arise.  Advised if symptoms do not improve or worsen, to please contact office for sooner follow up or seek emergency care.    I spent 33 minutes of dedicated to the care of this patient on the date of this encounter to include pre-visit review of records, face-to-face time with the patient discussing conditions above, post visit ordering of testing, clinical documentation with the electronic health record, making appropriate referrals as documented, and communicating necessary findings to members of the patients care team.     C. Leita Sanders, MD Advanced  Bronchoscopy PCCM [facility]    *This note was generated using voice recognition software/Dragon and/or AI transcription program.  Despite best efforts to proofread, errors can occur which can change the meaning. Any transcriptional errors that result from this process are unintentional and may not be fully corrected at the time of dictation.

## 2024-09-17 ENCOUNTER — Ambulatory Visit
Attending: Student in an Organized Health Care Education/Training Program | Admitting: Student in an Organized Health Care Education/Training Program

## 2024-09-17 ENCOUNTER — Encounter: Payer: Self-pay | Admitting: Student in an Organized Health Care Education/Training Program

## 2024-09-17 VITALS — BP 120/67 | HR 64 | Temp 98.1°F | Resp 18 | Ht 64.0 in | Wt 150.0 lb

## 2024-09-17 DIAGNOSIS — G8929 Other chronic pain: Secondary | ICD-10-CM | POA: Diagnosis present

## 2024-09-17 DIAGNOSIS — M1712 Unilateral primary osteoarthritis, left knee: Secondary | ICD-10-CM

## 2024-09-17 DIAGNOSIS — M5416 Radiculopathy, lumbar region: Secondary | ICD-10-CM | POA: Diagnosis present

## 2024-09-17 DIAGNOSIS — M546 Pain in thoracic spine: Secondary | ICD-10-CM | POA: Diagnosis present

## 2024-09-17 DIAGNOSIS — G894 Chronic pain syndrome: Secondary | ICD-10-CM | POA: Insufficient documentation

## 2024-09-17 DIAGNOSIS — M170 Bilateral primary osteoarthritis of knee: Secondary | ICD-10-CM | POA: Insufficient documentation

## 2024-09-17 DIAGNOSIS — M47816 Spondylosis without myelopathy or radiculopathy, lumbar region: Secondary | ICD-10-CM | POA: Insufficient documentation

## 2024-09-17 NOTE — Progress Notes (Signed)
 Safety precautions to be maintained throughout the outpatient stay will include: orient to surroundings, keep bed in low position, maintain call bell within reach at all times, provide assistance with transfer out of bed and ambulation.

## 2024-09-17 NOTE — Progress Notes (Signed)
 PROVIDER NOTE: Interpretation of information contained herein should be left to medically-trained personnel. Specific patient instructions are provided elsewhere under Patient Instructions section of medical record. This document was created in part using AI and STT-dictation technology, any transcriptional errors that may result from this process are unintentional.  Patient: Vanessa Greer  Service: E/M   PCP: Fernande Ophelia JINNY DOUGLAS, MD  DOB: [birth year]  DOS: 09/17/2024  Provider: Wallie Sherry, MD  MRN: [id number]  Delivery: Face-to-face  Specialty: Interventional Pain Management  Type: Established Patient  Setting: Ambulatory outpatient facility  Specialty designation: 09  Referring Prov.: Fernande Ophelia JINNY DOUGLAS, MD  Location: Outpatient office facility       History of present illness (HPI) Vanessa Greer, a 63 y.o. year old female, is here today because of her Bilateral primary osteoarthritis of knee [M17.0]. Vanessa Greer primary complain today is Knee Pain  Pertinent problems: Vanessa Greer has Chronic bilateral low back pain with bilateral sciatica; Lumbar facet arthropathy; Lumbar spondylosis; Lumbar degenerative disc disease; Chronic pain syndrome; and Coccyodynia on their pertinent problem list.  Pain Assessment: Severity of   is reported as a 4 /10. Location: Knee Left/Denies. Onset: More than a month ago. Quality: Aching, Stabbing. Timing: Constant. Modifying factor(s): Rest, Ice. Vitals:  height is 5' 4 (1.626 m) and weight is 150 lb (68 kg). Her temporal temperature is 98.1 F (36.7 C). Her blood pressure is 120/67 and her pulse is 64. Her respiration is 18.  BMI: Estimated body mass index is 25.75 kg/m as calculated from the following:   Height as of this encounter: 5' 4 (1.626 m).   Weight as of this encounter: 150 lb (68 kg).  Last encounter: 04/21/2024. Last procedure: Visit date not found.  Reason for encounter:  Patient presents with left knee pain.  This is related to knee  osteoarthritis.  She had a bilateral knee Monovisc injection with me in 2023 which helped her out for over 6 months.  She states that her left knee pain got worse last May and she went to [facility] and received a cortisone injection that was not helpful.  She has been trying knee exercises which have not been helpful.  She would like to repeat Monovisc injection for her left knee.     No results found for: CBDTHCR No results found for: D8THCCBX No results found for: D9THCCBX  ROS  Constitutional: Denies any fever or chills Gastrointestinal: No reported hemesis, hematochezia, vomiting, or acute GI distress Musculoskeletal: Left knee pain Neurological: No reported episodes of acute onset apraxia, aphasia, dysarthria, agnosia, amnesia, paralysis, loss of coordination, or loss of consciousness  Medication Review  HYDROcodone -acetaminophen , budesonide -formoterol , cyclobenzaprine , furosemide, multivitamin with minerals, pantoprazole, pregabalin, sertraline, simvastatin, and traZODone  History Review  Allergy: Vanessa Greer is allergic to bupropion. Drug: Vanessa Greer  reports no history of drug use. Alcohol:  reports that she does not currently use alcohol. Tobacco:  reports that she has been smoking cigarettes. She started smoking about 46 years ago. She has a 46.7 pack-year smoking history. She has never used smokeless tobacco. Social: Vanessa Greer  reports that she has been smoking cigarettes. She started smoking about 46 years ago. She has a 46.7 pack-year smoking history. She has never used smokeless tobacco. She reports that she does not currently use alcohol. She reports that she does not use drugs. Medical:  has a past medical history of Anxiety, Back pain, DDD (degenerative disc disease), lumbar, Depression, GERD (gastroesophageal reflux disease), High cholesterol, Lumbar  radiculitis, Pneumothorax (1982), and Sleep apnea. Surgical: Vanessa Greer  has a past surgical history that  includes Abdominal hysterectomy; Foot surgery; Ectopic pregnancy surgery; Colonoscopy; Laser ablation condolamata (N/A, 12/13/2017); Esophagogastroduodenoscopy (egd) with propofol  (N/A, 03/17/2019); and Trigger finger release (Right). Family: family history includes Breast cancer (age of onset: 30) in her mother; Heart disease in her father.  Laboratory Chemistry Profile   Renal No results found for: BUN, CREATININE, LABCREA, BCR, GFR, GFRAA, GFRNONAA, LABVMA, EPIRU, EPINEPH24HUR, NOREPRU, NOREPI24HUR, DOPARU, [id number]  Hepatic No results found for: AST, ALT, ALBUMIN, ALKPHOS, HCVAB, AMYLASE, LIPASE, AMMONIA  Electrolytes No results found for: NA, K, CL, CALCIUM, MG, PHOS  Bone No results found for: VD25OH, [id number], CI6874NY7, CI7874NY7, 25OHVITD1, 25OHVITD2, 25OHVITD3, TESTOFREE, TESTOSTERONE  Inflammation (CRP: Acute Phase) (ESR: Chronic Phase) No results found for: CRP, ESRSEDRATE, LATICACIDVEN       Note: Above Lab results reviewed.  Recent Imaging Review  CT CHEST LCS NODULE F/U LOW DOSE WO CONTRAST CLINICAL DATA:  63 year old female current smoker 37 pack-year smoking history, presenting for short-term follow-up  EXAM: CT CHEST WITHOUT CONTRAST FOR LUNG CANCER SCREENING NODULE FOLLOW-UP  TECHNIQUE: Multidetector CT imaging of the chest was performed following the standard protocol without IV contrast.  RADIATION DOSE REDUCTION: This exam was performed according to the departmental dose-optimization program which includes automated exposure control, adjustment of the mA and/or kV according to patient size and/or use of iterative reconstruction technique.  COMPARISON:  09/13/2023 screening chest CT  FINDINGS: Cardiovascular: Normal heart size. Stable trace pericardial effusion/thickening. Atherosclerotic nonaneurysmal thoracic aorta. Normal caliber pulmonary  arteries.  Mediastinum/Nodes: No significant thyroid nodules. Unremarkable esophagus. No pathologically enlarged axillary, mediastinal or hilar lymph nodes, noting limited sensitivity for the detection of hilar adenopathy on this noncontrast study.  Lungs/Pleura: No pneumothorax. No pleural effusion. Mild paraseptal and centrilobular emphysema with diffuse bronchial wall thickening. No acute consolidative airspace disease or lung masses. Previously visualized pulmonary nodules are either stable or resolved, with no significant growth of any of the previous nodules, the largest remaining of which is 3.9 mm in the subpleural anterior lingula on image 202. New indistinct subpleural peripheral left upper lobe 3.8 mm nodule on image 86. No additional new significant pulmonary nodules.  Upper abdomen: Right adrenal 2.4 cm nodule with density -3 HU, stable and compatible with an adenoma.  Musculoskeletal: No aggressive appearing focal osseous lesions. Mild thoracic spondylosis.  IMPRESSION: 1. Lung-RADS 2, benign appearance or behavior. Continue annual screening with low-dose chest CT without contrast in 12 months. 2. Stable right adrenal adenoma, for which no follow-up imaging is recommended. 3. Aortic Atherosclerosis (ICD10-I70.0) and Emphysema (ICD10-J43.9).  Electronically Signed   By: Selinda DELENA Blue M.D.   On: 08/28/2024 19:01 Note: Reviewed        Physical Exam  Vitals: BP 120/67 (BP Location: Right Arm, Patient Position: Sitting)   Pulse 64   Temp 98.1 F (36.7 C) (Temporal)   Resp 18   Ht 5' 4 (1.626 m)   Wt 150 lb (68 kg)   BMI 25.75 kg/m  BMI: Estimated body mass index is 25.75 kg/m as calculated from the following:   Height as of this encounter: 5' 4 (1.626 m).   Weight as of this encounter: 150 lb (68 kg). Ideal: Ideal body weight: 54.7 kg (120 lb 9.5 oz) Adjusted ideal body weight: 60 kg (132 lb 5.7 oz) General appearance: Well nourished, well developed, and  well hydrated. In no apparent acute distress Mental status: Alert, oriented x 3 (person, place, &  time)       Respiratory: No evidence of acute respiratory distress Eyes: PERLA Left knee pain, worse with weightbearing  Assessment   Diagnosis  1. Bilateral primary osteoarthritis of knee   2. Chronic pain syndrome   3. Lumbar radiculopathy   4. Chronic bilateral thoracic back pain   5. Lumbar spondylosis      Updated Problems: No problems updated.  Plan of Care  Plan for left knee Monovisc injection to help with left knee osteoarthritis Orders:  Orders Placed This Encounter  Procedures   KNEE INJECTION    Indications: Knee arthralgia (pain) due to osteoarthritis (OA) Imaging: None (CPT-20610) Position: Sitting Equipment/Materials: Block tray  1.5, 25-G (one per side)  Local anesthetic  Monovisc (one per side) Confirm availability (in office) of Monovisc (HMW hyaluronan)    Scheduling Instructions:     Procedure: Knee injection Monovisc (Hyaluronan/Hyaluronic acid)     Treatment No.:2            Level: Intra-articular     Laterality: LEFT     Sedation: Patient's choice.    Where will this procedure be performed?:   [facility]     status post left L3, L4, L5, S1 RFA on 07/06/2019 and right L3, L4, L5, S1 RFA on 07/22/2019-         Return in about 4 days (around 09/21/2024) for Left knee monvisc #2.    Recent Visits Date Type Provider Dept  07/14/24 Office Visit Patel, Seema K, NP [facility]  Showing recent visits within past 90 days and meeting all other requirements Today's Visits Date Type Provider Dept  09/17/24 Office Visit Marcelino Nurse, MD [facility]  Showing today's visits and meeting all other requirements Future Appointments Date Type Provider Dept  10/06/24 Appointment Patel, Seema K, NP [facility]  Showing future appointments within next 90 days and meeting all other requirements  I discussed the assessment  and treatment plan with the patient. The patient was provided an opportunity to ask questions and all were answered. The patient agreed with the plan and demonstrated an understanding of the instructions.  Patient advised to call back or seek an in-person evaluation if the symptoms or condition worsens.  I personally spent a total of 20 minutes in the care of the patient today including preparing to see the patient, getting/reviewing separately obtained history, performing a medically appropriate exam/evaluation, counseling and educating, placing orders, documenting clinical information in the EHR, and independently interpreting results.    Note by: Nurse Marcelino, MD (TTS and AI technology used. I apologize for any typographical errors that were not detected and corrected.) Date: 09/17/2024; Time: 10:24 AM

## 2024-09-28 ENCOUNTER — Ambulatory Visit
Attending: Student in an Organized Health Care Education/Training Program | Admitting: Student in an Organized Health Care Education/Training Program

## 2024-09-28 ENCOUNTER — Encounter: Payer: Self-pay | Admitting: Student in an Organized Health Care Education/Training Program

## 2024-09-28 VITALS — BP 135/77 | HR 62 | Temp 98.2°F | Resp 14 | Ht 64.0 in | Wt 150.0 lb

## 2024-09-28 DIAGNOSIS — M25562 Pain in left knee: Secondary | ICD-10-CM | POA: Diagnosis not present

## 2024-09-28 DIAGNOSIS — M170 Bilateral primary osteoarthritis of knee: Secondary | ICD-10-CM | POA: Diagnosis present

## 2024-09-28 MED ORDER — LIDOCAINE HCL 2 % IJ SOLN
20.0000 mL | Freq: Once | INTRAMUSCULAR | Status: AC
  Administered 2024-09-28: 400 mg
  Filled 2024-09-28: qty 20

## 2024-09-28 MED ORDER — HYALURONAN 88 MG/4ML IX SOSY
88.0000 mg | PREFILLED_SYRINGE | Freq: Once | INTRA_ARTICULAR | Status: AC
  Administered 2024-09-28: 88 mg via INTRA_ARTICULAR

## 2024-09-28 NOTE — Patient Instructions (Signed)

## 2024-09-28 NOTE — Progress Notes (Signed)
 PROVIDER NOTE: Interpretation of information contained herein should be left to medically-trained personnel. Specific patient instructions are provided elsewhere under Patient Instructions section of medical record. This document was created in part using STT-dictation technology, any transcriptional errors that may result from this process are unintentional.  Patient: Vanessa Greer Type: Established DOB: [birth year] MRN: [id number] PCP: Vanessa Ophelia JINNY DOUGLAS, MD  Service: Procedure DOS: 09/28/2024 Setting: Ambulatory Location: Ambulatory outpatient facility Delivery: Face-to-face Provider: Wallie Sherry, MD Specialty: Interventional Pain Management Specialty designation: 09 Location: Outpatient facility Ref. Prov.: Greer Wallie, MD    Primary Reason for Visit: Interventional Pain Management Treatment. CC: Knee Pain (left)   Procedure:           Type: Monovisc Intra-articular Knee Injection Laterality: Left (-LT) No.: 2  Series: n/a Level/approach: Medial Imaging guidance: None required ([id number]) Anesthesia: Local anesthesia (1-2% Lidocaine ) Anxiolysis: None                 Sedation: None.  Purpose: Diagnostic/Therapeutic Indications: Knee arthralgia associated to osteoarthritis of the knee 1. Bilateral primary osteoarthritis of knee    NAS-11 score:   Pre-procedure: 5 /10   Post-procedure: 5 /10     Pre-Procedure Preparation  Monitoring: As per clinic protocol.  Risk Assessment: Vitals:  AFP:Zdupfjuzi body mass index is 25.75 kg/m as calculated from the following:   Height as of this encounter: 5' 4 (1.626 m).   Weight as of this encounter: 150 lb (68 kg)., Rate:62 , BP:135/77, Resp:14, Temp:98.2 F (36.8 C), SpO2:97 %  Allergies: She is allergic to bupropion.  Precautions: No additional precautions required  Blood-thinner(s): None at this time  Coagulopathies: Reviewed. None identified.   Active Infection(s): Reviewed. None identified. [name] is afebrile    Location setting: Exam room Position: Sitting w/ knee bent 90 degrees Safety Precautions: Patient was assessed for positional comfort and pressure points before starting the procedure. Prepping solution: DuraPrep (Iodine Povacrylex [0.7% available iodine] and Isopropyl Alcohol, 74% w/w) Prep Area: Entire knee region Approach: percutaneous, just above the tibial plateau, lateral to the infrapatellar tendon. Intended target: Intra-articular knee space Materials: Tray: Block Needle(s): Regular Qty: 1/side Length: 1.5-inch Gauge: 25G   Meds ordered this encounter  Medications   Hyaluronan (MONOVISC) intra-articular injection 88 mg    Do not substitute for formulary alternative without first contacting physician for approval. Deliver to facility day before procedure.   lidocaine  (XYLOCAINE ) 2 % (with pres) injection 400 mg    No orders of the defined types were placed in this encounter.    Time-out: 1015 I initiated and conducted the Time-out before starting the procedure, as per protocol. The patient was asked to participate by confirming the accuracy of the Time Out information. Verification of the correct person, site, and procedure were performed and confirmed by me, the nursing staff, and the patient. Time-out conducted as per Joint Commission's Universal Protocol (UP.01.01.01). Procedure checklist: Completed   H&P (Pre-op  Assessment)  [name] is a 63 y.o. (year old), female patient, seen today for interventional treatment. She  has a past surgical history that includes Abdominal hysterectomy; Foot surgery; Ectopic pregnancy surgery; Colonoscopy; Laser ablation condolamata (N/A, 12/13/2017); Esophagogastroduodenoscopy (egd) with propofol  (N/A, 03/17/2019); and Trigger finger release (Right). [name] has a current medication list which includes the following prescription(s): budesonide -formoterol , cyclobenzaprine , furosemide, hydrocodone -acetaminophen ,  hydrocodone -acetaminophen , hydrocodone -acetaminophen , lyrica, multivitamin with minerals, pantoprazole, sertraline, simvastatin, and trazodone. Her primarily concern today is the Knee Pain (left)  She is allergic to bupropion.   Last  encounter: My last encounter with her was on 03/19/2022. Pertinent problems: [name] has Chronic bilateral low back pain with bilateral sciatica; Lumbar facet arthropathy; Lumbar spondylosis; Lumbar degenerative disc disease; Chronic pain syndrome; and Coccyodynia on their pertinent problem list. Pain Assessment: Severity of Chronic pain is reported as a 5 /10. Location: Knee Left/ . Onset: More than a month ago. Quality: Constant, Sharp. Timing: Constant. Modifying factor(s): nothing. Vitals:  height is 5' 4 (1.626 m) and weight is 150 lb (68 kg). Her temporal temperature is 98.2 F (36.8 C). Her blood pressure is 135/77 and her pulse is 62. Her respiration is 14 and oxygen saturation is 97%.   Reason for encounter: interventional pain management therapy due pain of at least four (4) weeks in duration, with failure to respond and/or inability to tolerate more conservative care.   Site Confirmation: [name] was asked to confirm the procedure and laterality before marking the site.  Consent: Before the procedure and under the influence of no sedative(s), amnesic(s), or anxiolytics, the patient was informed of the treatment options, risks and possible complications. To fulfill our ethical and legal obligations, as recommended by the American Medical Association's Code of Ethics, I have informed the patient of my clinical impression; the nature and purpose of the treatment or procedure; the risks, benefits, and possible complications of the intervention; the alternatives, including doing nothing; the risk(s) and benefit(s) of the alternative treatment(s) or procedure(s); and the risk(s) and benefit(s) of doing nothing. The patient was provided information about the  general risks and possible complications associated with the procedure. These may include, but are not limited to: failure to achieve desired goals, infection, bleeding, organ or nerve damage, allergic reactions, paralysis, and death. In addition, the patient was informed of those risks and complications associated to Spine-related procedures, such as failure to decrease pain; infection (i.e.: Meningitis, epidural or intraspinal abscess); bleeding (i.e.: epidural hematoma, subarachnoid hemorrhage, or any other type of intraspinal or peri-dural bleeding); organ or nerve damage (i.e.: Any type of peripheral nerve, nerve root, or spinal cord injury) with subsequent damage to sensory, motor, and/or autonomic systems, resulting in permanent pain, numbness, and/or weakness of one or several areas of the body; allergic reactions; (i.e.: anaphylactic reaction); and/or death. Furthermore, the patient was informed of those risks and complications associated with the medications. These include, but are not limited to: allergic reactions (i.e.: anaphylactic or anaphylactoid reaction(s)); adrenal axis suppression; blood sugar elevation that in diabetics may result in ketoacidosis or comma; water retention that in patients with history of congestive heart failure may result in shortness of breath, pulmonary edema, and decompensation with resultant heart failure; weight gain; swelling or edema; medication-induced neural toxicity; particulate matter embolism and blood vessel occlusion with resultant organ, and/or nervous system infarction; and/or aseptic necrosis of one or more joints. Finally, the patient was informed that Medicine is not an exact science; therefore, there is also the possibility of unforeseen or unpredictable risks and/or possible complications that may result in a catastrophic outcome. The patient indicated having understood very clearly. We have given the patient no guarantees and we have made no promises.  Enough time was given to the patient to ask questions, all of which were answered to the patient's satisfaction. [name] has indicated that she wanted to continue with the procedure. Attestation: I, the ordering provider, attest that I have discussed with the patient the benefits, risks, side-effects, alternatives, likelihood of achieving goals, and potential problems during recovery for the procedure that  I have provided informed consent.  Date  Time: 09/28/2024  9:39 AM   Description of procedure   Start Time: 1016 hrs  Local Anesthesia: Once the patient was positioned, prepped, and time-out was completed. The target area was identified located. The skin was marked with an approved surgical skin marker. Once marked, the skin (epidermis, dermis, and hypodermis), and deeper tissues (fat, connective tissue and muscle) were infiltrated with a small amount of a short-acting local anesthetic, loaded on a 10cc syringe with a 25G, 1.5-in  Needle. An appropriate amount of time was allowed for local anesthetics to take effect before proceeding to the next step. Local Anesthetic: Lidocaine  1-2% The unused portion of the local anesthetic was discarded in the proper designated containers. Safety Precautions: Aspiration looking for blood return was conducted prior to all injections. At no point did I inject any substances, as a needle was being advanced. Before injecting, the patient was told to immediately notify me if she was experiencing any new onset of ringing in the ears, or metallic taste in the mouth. No attempts were made at seeking any paresthesias. Safe injection practices and needle disposal techniques used. Medications properly checked for expiration dates. SDV (single dose vial) medications used. After the completion of the procedure, all disposable equipment used was discarded in the proper designated medical waste containers.  Technical description: Protocol guidelines were followed. After  positioning, the target area was identified and prepped in the usual manner. Skin & deeper tissues infiltrated with local anesthetic. Appropriate amount of time allowed to pass for local anesthetics to take effect. Proper needle placement secured. Once satisfactory needle placement was confirmed, I proceeded to inject the desired solution in slow, incremental fashion, intermittently assessing for discomfort or any signs of abnormal or undesired spread of substance. Once completed, the needle was removed and disposed of, as per hospital protocols. The area was cleaned, making sure to leave some of the prepping solution back to take advantage of its long term bactericidal properties.  Aspiration:  Negative   Vitals:   09/28/24 0956  BP: 135/77  Pulse: 62  Resp: 14  Temp: 98.2 F (36.8 C)  TempSrc: Temporal  SpO2: 97%  Weight: 150 lb (68 kg)  Height: 5' 4 (1.626 m)     End Time: 1018 hrs    10 cc of fluid aspirated from knee Post-op assessment  Post-procedure Vital Signs:  Pulse/HCG Rate: 62  Temp: 98.2 F (36.8 C) Resp: 14 BP: 135/77 SpO2: 97 %  EBL: None  Complications: No immediate post-treatment complications observed by team, or reported by patient.  Note: The patient tolerated the entire procedure well. A repeat set of vitals were taken after the procedure and the patient was kept under observation following institutional policy, for this type of procedure. Post-procedural neurological assessment was performed, showing return to baseline, prior to discharge. The patient was provided with post-procedure discharge instructions, including a section on how to identify potential problems. Should any problems arise concerning this procedure, the patient was given instructions to immediately contact us , at any time, without hesitation. In any case, we plan to contact the patient by telephone for a follow-up status report regarding this interventional procedure.  Comments:  No  additional relevant information.   Plan of care  Chronic Opioid Analgesic:  Norco 10 mg TID prn   Medications administered: We administered Hyaluronan and lidocaine .  Follow-up plan:   Return for Keep sch. appt.      status post left L3, L4,  L5, S1 RFA on 07/06/2019 and right L3, L4, L5, S1 RFA on 07/22/2019-        Recent Visits Date Type Provider Dept  09/17/24 Office Visit Marcelino Nurse, MD [facility]  07/14/24 Office Visit Patel, Seema K, NP [facility]  Showing recent visits within past 90 days and meeting all other requirements Today's Visits Date Type Provider Dept  09/28/24 Procedure visit Marcelino Nurse, MD [facility]  Showing today's visits and meeting all other requirements Future Appointments Date Type Provider Dept  10/06/24 Appointment Patel, Seema K, NP [facility]  Showing future appointments within next 90 days and meeting all other requirements   Disposition: Discharge home  Discharge (Date  Time): 09/28/2024; 1020 hrs.   Primary Care Physician: Vanessa Ophelia JINNY DOUGLAS, MD Location: [facility] Note by: Nurse Marcelino, MD Date: 09/28/2024; Time: 10:36 AM  DISCLAIMER: Medicine is not an exact science. It has no guarantees or warranties. The decision to proceed with this intervention was based on the information collected from the patient. Conclusions were drawn from the patient's questionnaire, interview, and examination. Because information was provided in large part by the patient, it cannot be guaranteed that it has not been purposely or unconsciously manipulated or altered. Every effort has been made to obtain as much accurate, relevant, available data as possible. Always take into account that the treatment will also be dependent on availability of resources and existing treatment guidelines, considered by other Pain Management Specialists as being common knowledge and practice, at the time of the  intervention. It is also important to point out that variation in procedural techniques and pharmacological choices are the acceptable norm. For Medico-Legal review purposes, the indications, contraindications, technique, and results of the these procedures should only be evaluated, judged and interpreted by a Board-Certified Interventional Pain Specialist with extensive familiarity and expertise in the same exact procedure and technique.

## 2024-09-29 ENCOUNTER — Telehealth: Payer: Self-pay | Admitting: *Deleted

## 2024-09-29 NOTE — Telephone Encounter (Signed)
Called for post procedure check. No answer. Left voicemail.

## 2024-10-06 ENCOUNTER — Ambulatory Visit: Attending: Nurse Practitioner | Admitting: Nurse Practitioner

## 2024-10-06 ENCOUNTER — Encounter: Payer: Self-pay | Admitting: Nurse Practitioner

## 2024-10-06 VITALS — BP 136/76 | HR 60 | Temp 97.8°F | Resp 18 | Ht 64.0 in | Wt 150.0 lb

## 2024-10-06 DIAGNOSIS — M170 Bilateral primary osteoarthritis of knee: Secondary | ICD-10-CM | POA: Diagnosis present

## 2024-10-06 DIAGNOSIS — Z79899 Other long term (current) drug therapy: Secondary | ICD-10-CM | POA: Insufficient documentation

## 2024-10-06 DIAGNOSIS — G894 Chronic pain syndrome: Secondary | ICD-10-CM | POA: Diagnosis not present

## 2024-10-06 DIAGNOSIS — M546 Pain in thoracic spine: Secondary | ICD-10-CM | POA: Diagnosis not present

## 2024-10-06 DIAGNOSIS — G8929 Other chronic pain: Secondary | ICD-10-CM | POA: Insufficient documentation

## 2024-10-06 DIAGNOSIS — M5416 Radiculopathy, lumbar region: Secondary | ICD-10-CM | POA: Insufficient documentation

## 2024-10-06 MED ORDER — HYDROCODONE-ACETAMINOPHEN 10-325 MG PO TABS: 1.0000 | ORAL_TABLET | Freq: Four times a day (QID) | ORAL | 0 refills | Status: DC | PRN

## 2024-10-06 MED ORDER — NALOXONE HCL 4 MG/0.1ML NA LIQD: 1.0000 | NASAL | 1 refills | Status: AC | PRN

## 2024-10-06 NOTE — Progress Notes (Signed)
 PROVIDER NOTE: Interpretation of information contained herein should be left to medically-trained personnel. Specific patient instructions are provided elsewhere under Patient Instructions section of medical record. This document was created in part using AI and STT-dictation technology, any transcriptional errors that may result from this process are unintentional.  Patient: Vanessa Greer  Service: E/M   PCP: Fernande Ophelia JINNY DOUGLAS, MD  DOB: [birth year]  DOS: 10/06/2024  Provider: Emmy MARLA Blanch, NP  MRN: [id number]  Delivery: Face-to-face  Specialty: Interventional Pain Management  Type: Established Patient  Setting: Ambulatory outpatient facility  Specialty designation: 09  Referring Prov.: Fernande Ophelia JINNY DOUGLAS, MD  Location: Outpatient office facility       History of present illness (HPI) Vanessa Greer, a 63 y.o. year old female, is here today because of her Left knee pain. Vanessa Greer primary complain today is Knee Pain (Left Knee )  Pertinent problems: Vanessa Greer has Chronic bilateral low back pain with bilateral sciatica; Lumbar facet arthropathy; Lumbar spondylosis; Lumbar degenerative disc disease; Chronic pain syndrome; and Coccyodynia on their pertinent problem list.  Pain Assessment: Severity of Chronic pain is reported as a 4 /10. Location: Knee Left/Denies. Onset: More than a month ago. Quality: Sharp. Timing: Constant. Modifying factor(s): Ice. Vitals:  height is 5' 4 (1.626 m) and weight is 150 lb (68 kg). Her temporal temperature is 97.8 F (36.6 C). Her blood pressure is 136/76 and her pulse is 60. Her respiration is 18 and oxygen saturation is 100%.  BMI: Estimated body mass index is 25.75 kg/m as calculated from the following:   Height as of this encounter: 5' 4 (1.626 m).   Weight as of this encounter: 150 lb (68 kg).  Last encounter: 07/14/2024. Last procedure: Visit date not found.  Reason for encounter: medication management. No change in medical history since last  visit.  Patient's pain is at baseline.  Patient continues multimodal pain regimen as prescribed.  States that it provides pain relief and improvement in functional status.  The patient continues struggling with left knee pain and has a history of bilateral primary knee osteoarthritis.  She previously received a knee injection without any significant benefit.  The patient expressed interest in obtaining an orthopedic referral for further evaluation and possible surgical management.  Vanessa Greer received a diagnostic/therapeutic intra-articular knee injection on September 28, 2024.  She reports 100% pain relief and functional improvement during local anesthetic phase for about 24 hours.  However, after local anesthesia wore off the pain back to baseline and does not provide any pain relief since then.   Procedure Type: Monovisc Intra-articular Knee Injection Laterality: Left (-LT) No.: 2  Series: n/a Level/approach: Medial Imaging guidance: None required (CPT-20610) Anesthesia: Local anesthesia (1-2% Lidocaine ) Anxiolysis: None                 Sedation: None.   Purpose: Diagnostic/Therapeutic Indications: Knee arthralgia associated to osteoarthritis of the knee 1. Bilateral primary osteoarthritis of knee     NAS-11 score:        Pre-procedure: 5 /10        Post-procedure: 5 /10   Post-Procedure Evaluation    Effectiveness:  Initial hour after procedure: 100 % . Subsequent 4-6 hours post-procedure: 50 % . Analgesia past initial 6 hours: 0 % . Ongoing improvement:  Analgesic:  Vanessa Greer received a diagnostic/therapeutic intra-articular knee injection on September 28, 2024.  She reports 100% pain relief and functional improvement during local anesthetic phase for about 24 hours.  However, after local anesthesia wore off the pain back to baseline and does not provide any pain relief since then.  Function: Back to baseline ROM: Back to baseline  Pharmacotherapy Assessment    Hydrocodone -acetaminophen  (Norco) 10-325 mg tablet every 8 hours as needed for pain. MME=30  Monitoring: [place] PMP: PDMP reviewed during this encounter.       Pharmacotherapy: No side-effects or adverse reactions reported. Compliance: No problems identified. Effectiveness: Clinically acceptable.  Erlene Doyal SAUNDERS, [place]  10/06/2024  8:58 AM  Sign when Signing Visit Nursing Pain Medication Assessment:  Safety precautions to be maintained throughout the outpatient stay will include: orient to surroundings, keep bed in low position, maintain call bell within reach at all times, provide assistance with transfer out of bed and ambulation.  Medication Inspection Compliance: Pill count conducted under aseptic conditions, in front of the patient. Neither the pills nor the bottle was removed from the patient's sight at any time. Once count was completed pills were immediately returned to the patient in their original bottle.  Medication: Hydrocodone /APAP Pill/Patch Count: 47 of 90 pills/patches remain Pill/Patch Appearance: Markings consistent with prescribed medication Bottle Appearance: Standard pharmacy container. Clearly labeled. Filled Date: 09 / 25 / 2025 Last Medication intake:  Today    UDS:  Summary  Date Value Ref Range Status  09/03/2023 Note  Final    Comment:    ==================================================================== Tox[employer] Select 13 (MW) ==================================================================== Specimen Alert Note: Urinary creatinine is low; ability to detect some drugs may be compromised. Interpret results with caution. (Creatinine) ==================================================================== Test                             Result       Flag       Units  Drug Present and Declared for Prescription Verification   Hydrocodone                     2605         EXPECTED   ng/mg creat   Hydromorphone                  695          EXPECTED   ng/mg  creat   Dihydrocodeine                 400          EXPECTED   ng/mg creat   Norhydrocodone                 8000         EXPECTED   ng/mg creat    Sources of hydrocodone  include scheduled prescription medications.    Hydromorphone, dihydrocodeine and norhydrocodone are expected    metabolites of hydrocodone . Hydromorphone and dihydrocodeine are    also available as scheduled prescription medications.  Drug Absent but Declared for Prescription Verification   Buprenorphine                   Not Detected UNEXPECTED ng/mg creat    Low dose buprenorphine  is not always detected even when used as    directed.  ==================================================================== Test                      Result    Flag   Units      Ref Range   Creatinine  19        LL     mg/dL      >=79 ==================================================================== Declared Medications:  The flagging and interpretation on this report are based on the  following declared medications.  Unexpected results may arise from  inaccuracies in the declared medications.   **Note: The testing scope of this panel includes these medications:   Hydrocodone  (Norco)   **Note: The testing scope of this panel does not include small to  moderate amounts of these reported medications:   Buprenorphine  (Belbuca )   **Note: The testing scope of this panel does not include the  following reported medications:   Acetaminophen  (Norco)  Budesonide  (Symbicort )  Cyclobenzaprine  (Flexeril )  Diclofenac  (Voltaren )  Formoterol  (Symbicort )  Furosemide (Lasix)  Multivitamin  Pantoprazole (Protonix)  Pregabalin (Lyrica)  Sertraline (Zoloft)  Simvastatin (Zocor)  Trazodone (Desyrel) ==================================================================== For clinical consultation, please call [phone]. ====================================================================     No results found for:  CBDTHCR No results found for: D8THCCBX No results found for: D9THCCBX  ROS  Constitutional: Denies any fever or chills Gastrointestinal: No reported hemesis, hematochezia, vomiting, or acute GI distress Musculoskeletal: Bilateral knee pain (L>R) Neurological: No reported episodes of acute onset apraxia, aphasia, dysarthria, agnosia, amnesia, paralysis, loss of coordination, or loss of consciousness  Medication Review  HYDROcodone -acetaminophen , budesonide -formoterol , cyclobenzaprine , furosemide, multivitamin with minerals, naloxone, pantoprazole, pregabalin, sertraline, simvastatin, and traZODone  History Review  Allergy: Vanessa Greer is allergic to bupropion. Drug: Vanessa Greer  reports no history of drug use. Alcohol:  reports that she does not currently use alcohol. Tobacco:  reports that she has been smoking cigarettes. She started smoking about 46 years ago. She has a 46.8 pack-year smoking history. She has never used smokeless tobacco. Social: Vanessa Greer  reports that she has been smoking cigarettes. She started smoking about 46 years ago. She has a 46.8 pack-year smoking history. She has never used smokeless tobacco. She reports that she does not currently use alcohol. She reports that she does not use drugs. Medical:  has a past medical history of Anxiety, Back pain, DDD (degenerative disc disease), lumbar, Depression, GERD (gastroesophageal reflux disease), High cholesterol, Lumbar radiculitis, Pneumothorax (1982), and Sleep apnea. Surgical: Vanessa Greer  has a past surgical history that includes Abdominal hysterectomy; Foot surgery; Ectopic pregnancy surgery; Colonoscopy; Laser ablation condolamata (N/A, 12/13/2017); Esophagogastroduodenoscopy (egd) with propofol  (N/A, 03/17/2019); and Trigger finger release (Right). Family: family history includes Breast cancer (age of onset: 62) in her mother; Heart disease in her father.  Laboratory Chemistry Profile   Renal No results  found for: BUN, CREATININE, LABCREA, BCR, GFR, GFRAA, GFRNONAA, LABVMA, EPIRU, EPINEPH24HUR, NOREPRU, NOREPI24HUR, DOPARU, [id number]  Hepatic No results found for: AST, ALT, ALBUMIN, ALKPHOS, HCVAB, AMYLASE, LIPASE, AMMONIA  Electrolytes No results found for: NA, K, CL, CALCIUM, MG, PHOS  Bone No results found for: VD25OH, [id number], CI6874NY7, CI7874NY7, 25OHVITD1, 25OHVITD2, 25OHVITD3, TESTOFREE, TESTOSTERONE  Inflammation (CRP: Acute Phase) (ESR: Chronic Phase) No results found for: CRP, ESRSEDRATE, LATICACIDVEN       Note: Above Lab results reviewed.  Recent Imaging Review  CT CHEST LCS NODULE F/U LOW DOSE WO CONTRAST CLINICAL DATA:  63 year old female current smoker 91 pack-year smoking history, presenting for short-term follow-up  EXAM: CT CHEST WITHOUT CONTRAST FOR LUNG CANCER SCREENING NODULE FOLLOW-UP  TECHNIQUE: Multidetector CT imaging of the chest was performed following the standard protocol without IV contrast.  RADIATION DOSE REDUCTION: This exam was performed according to the departmental dose-optimization program which includes automated exposure control, adjustment of the  mA and/or kV according to patient size and/or use of iterative reconstruction technique.  COMPARISON:  09/13/2023 screening chest CT  FINDINGS: Cardiovascular: Normal heart size. Stable trace pericardial effusion/thickening. Atherosclerotic nonaneurysmal thoracic aorta. Normal caliber pulmonary arteries.  Mediastinum/Nodes: No significant thyroid nodules. Unremarkable esophagus. No pathologically enlarged axillary, mediastinal or hilar lymph nodes, noting limited sensitivity for the detection of hilar adenopathy on this noncontrast study.  Lungs/Pleura: No pneumothorax. No pleural effusion. Mild paraseptal and centrilobular emphysema with diffuse bronchial wall thickening. No acute consolidative airspace  disease or lung masses. Previously visualized pulmonary nodules are either stable or resolved, with no significant growth of any of the previous nodules, the largest remaining of which is 3.9 mm in the subpleural anterior lingula on image 202. New indistinct subpleural peripheral left upper lobe 3.8 mm nodule on image 86. No additional new significant pulmonary nodules.  Upper abdomen: Right adrenal 2.4 cm nodule with density -3 HU, stable and compatible with an adenoma.  Musculoskeletal: No aggressive appearing focal osseous lesions. Mild thoracic spondylosis.  IMPRESSION: 1. Lung-RADS 2, benign appearance or behavior. Continue annual screening with low-dose chest CT without contrast in 12 months. 2. Stable right adrenal adenoma, for which no follow-up imaging is recommended. 3. Aortic Atherosclerosis (ICD10-I70.0) and Emphysema (ICD10-J43.9).  Electronically Signed   By: Selinda DELENA Blue M.D.   On: 08/28/2024 19:01 Note: Reviewed        Physical Exam  Vitals: BP 136/76 (BP Location: Right Arm, Patient Position: Sitting)   Pulse 60   Temp 97.8 F (36.6 C) (Temporal)   Resp 18   Ht 5' 4 (1.626 m)   Wt 150 lb (68 kg)   SpO2 100%   BMI 25.75 kg/m  BMI: Estimated body mass index is 25.75 kg/m as calculated from the following:   Height as of this encounter: 5' 4 (1.626 m).   Weight as of this encounter: 150 lb (68 kg). Ideal: Ideal body weight: 54.7 kg (120 lb 9.5 oz) Adjusted ideal body weight: 60 kg (132 lb 5.7 oz) General appearance: Well nourished, well developed, and well hydrated. In no apparent acute distress Mental status: Alert, oriented x 3 (person, place, & time)       Respiratory: No evidence of acute respiratory distress Eyes: PERLA  Musculoskeletal: + Bilateral knee pain worse with walking, prolonged standing, and weightbearing Assessment   Diagnosis Status  1. Chronic pain syndrome   2. Bilateral primary osteoarthritis of knee   3. Lumbar radiculopathy    4. Chronic bilateral thoracic back pain   5. Medication management    Controlled Controlled Controlled   Updated Problems: No problems updated.  Plan of Care  Problem-specific:  Assessment and Plan  Chronic pain syndrome: Patient's pain is well-controlled with hydrocodone , will continue on current medication regimen. Prescribing drug monitoring (PDMP) reviewed, findings consistent with the use of prescribe medication and no evidence of narcotic misuse or abuse. Routine UDS order today. Schedule follow up in 90 days for medication management.   Bilateral Primary Osteoarthritis: The patient continues struggling with left knee pain and has a history of bilateral primary knee osteoarthritis.  She previously received a knee injection without any significant benefit.  The patient expressed interest in obtaining an orthopedic referral for further evaluation and possible surgical management.   Vanessa Greer has a current medication list which includes the following long-term medication(s): budesonide -formoterol , furosemide, lyrica, sertraline, simvastatin, trazodone, [START ON 10/24/2024] hydrocodone -acetaminophen , [START ON 11/23/2024] hydrocodone -acetaminophen , and [START ON 12/23/2024] hydrocodone -acetaminophen .  Pharmacotherapy (Medications  Ordered): Meds ordered this encounter  Medications   HYDROcodone -acetaminophen  (NORCO) 10-325 MG tablet    Sig: Take 1 tablet by mouth every 6 (six) hours as needed for severe pain (pain score 7-10). Must last 30 days. For chronic pain.    Dispense:  120 tablet    Refill:  0    [place] STOP ACT - Not applicable. Fill one day early if pharmacy is closed on scheduled refill date.   HYDROcodone -acetaminophen  (NORCO) 10-325 MG tablet    Sig: Take 1 tablet by mouth every 6 (six) hours as needed for severe pain (pain score 7-10). Must last 30 days. For chronic pain.    Dispense:  120 tablet    Refill:  0    [place] STOP ACT - Not applicable. Fill one day  early if pharmacy is closed on scheduled refill date.   HYDROcodone -acetaminophen  (NORCO) 10-325 MG tablet    Sig: Take 1 tablet by mouth every 6 (six) hours as needed for severe pain (pain score 7-10). Must last 30 days. For chronic pain.    Dispense:  120 tablet    Refill:  0    [place] STOP ACT - Not applicable. Fill one day early if pharmacy is closed on scheduled refill date.   naloxone (NARCAN) nasal spray 4 mg/0.1 mL    Sig: Place 1 spray into the nose as needed for up to 365 doses (for opioid-induced respiratory depresssion). In case of emergency (overdose), spray once into each nostril. If no response within 3 minutes, repeat application and call 911.    Dispense:  1 each    Refill:  1    Instruct patient in proper use of device.   Orders:  Orders Placed This Encounter  Procedures   ToxASSURE Select 13 (MW), Urine    Volume: 30 ml(s). Minimum 3 ml of urine is needed. Document temperature of fresh sample. Indications: Long term (current) use of opiate analgesic (S20.108)    Release to patient:   Immediate   Ambulatory referral to Orthopedic Surgery    Referral Priority:   Routine    Referral Type:   Surgical    Referral Reason:   Specialty Services Required    Referred to Provider:   Kathlynn Sharper, MD    Requested Specialty:   Orthopedic Surgery    Number of Visits Requested:   1        Return in about 3 months (around 01/06/2025) for (F2F), (MM), Emmy Blanch NP.    Recent Visits Date Type Provider Dept  09/28/24 Procedure visit Marcelino Nurse, MD [facility]  09/17/24 Office Visit Marcelino Nurse, MD [facility]  07/14/24 Office Visit Tresea Heine K, NP [facility]  Showing recent visits within past 90 days and meeting all other requirements Today's Visits Date Type Provider Dept  10/06/24 Office Visit Charly Holcomb K, NP [facility]  Showing today's visits and meeting all other requirements Future Appointments Date Type Provider Dept   12/30/24 Appointment Eutimio Gharibian K, NP [facility]  Showing future appointments within next 90 days and meeting all other requirements  I discussed the assessment and treatment plan with the patient. The patient was provided an opportunity to ask questions and all were answered. The patient agreed with the plan and demonstrated an understanding of the instructions.  Patient advised to call back or seek an in-person evaluation if the symptoms or condition worsens.  Duration of encounter: 30 minutes.  Total time on encounter, as  per AMA guidelines included both the face-to-face and non-face-to-face time personally spent by the physician and/or other qualified health care professional(s) on the day of the encounter (includes time in activities that require the physician or other qualified health care professional and does not include time in activities normally performed by clinical staff). Physician's time may include the following activities when performed: Preparing to see the patient (e.g., pre-charting review of records, searching for previously ordered imaging, lab work, and nerve conduction tests) Review of prior analgesic pharmacotherapies. Reviewing PMP Interpreting ordered tests (e.g., lab work, imaging, nerve conduction tests) Performing post-procedure evaluations, including interpretation of diagnostic procedures Obtaining and/or reviewing separately obtained history Performing a medically appropriate examination and/or evaluation Counseling and educating the patient/family/caregiver Ordering medications, tests, or procedures Referring and communicating with other health care professionals (when not separately reported) Documenting clinical information in the electronic or other health record Independently interpreting results (not separately reported) and communicating results to the patient/ family/caregiver Care coordination (not separately reported)  Note by: Tylin Stradley K  Arthurine Oleary, NP (TTS and AI technology used. I apologize for any typographical errors that were not detected and corrected.) Date: 10/06/2024; Time: 11:39 AM

## 2024-10-06 NOTE — Progress Notes (Signed)
 Nursing Pain Medication Assessment:  Safety precautions to be maintained throughout the outpatient stay will include: orient to surroundings, keep bed in low position, maintain call bell within reach at all times, provide assistance with transfer out of bed and ambulation.  Medication Inspection Compliance: Pill count conducted under aseptic conditions, in front of the patient. Neither the pills nor the bottle was removed from the patient's sight at any time. Once count was completed pills were immediately returned to the patient in their original bottle.  Medication: Hydrocodone /APAP Pill/Patch Count: 47 of 90 pills/patches remain Pill/Patch Appearance: Markings consistent with prescribed medication Bottle Appearance: Standard pharmacy container. Clearly labeled. Filled Date: 09 / 25 / 2025 Last Medication intake:  Today

## 2024-10-09 LAB — TOXASSURE SELECT 13 (MW), URINE

## 2024-10-21 ENCOUNTER — Telehealth: Payer: Self-pay | Admitting: Nurse Practitioner

## 2024-10-21 NOTE — Telephone Encounter (Signed)
 Patient is asking pharmacy to fill script early, states she was told to increase her dose to 4 times daily and needs to fill early. Please advise Lamar at [employer] [phone]

## 2024-10-22 NOTE — Telephone Encounter (Signed)
 Patient notified that she cannot have an early fill. She can pick script up on 10/24/2024. Patient, not happy, but verbalized understanding.

## 2024-12-22 ENCOUNTER — Encounter: Payer: Self-pay | Admitting: Nurse Practitioner

## 2024-12-22 ENCOUNTER — Ambulatory Visit: Attending: Nurse Practitioner | Admitting: Nurse Practitioner

## 2024-12-22 VITALS — BP 112/71 | HR 59 | Temp 97.3°F | Resp 18 | Ht 64.0 in | Wt 152.0 lb

## 2024-12-22 DIAGNOSIS — M542 Cervicalgia: Secondary | ICD-10-CM | POA: Insufficient documentation

## 2024-12-22 DIAGNOSIS — M4726 Other spondylosis with radiculopathy, lumbar region: Secondary | ICD-10-CM

## 2024-12-22 DIAGNOSIS — Z79899 Other long term (current) drug therapy: Secondary | ICD-10-CM | POA: Diagnosis present

## 2024-12-22 DIAGNOSIS — M546 Pain in thoracic spine: Secondary | ICD-10-CM | POA: Diagnosis present

## 2024-12-22 DIAGNOSIS — M5416 Radiculopathy, lumbar region: Secondary | ICD-10-CM | POA: Insufficient documentation

## 2024-12-22 DIAGNOSIS — G8929 Other chronic pain: Secondary | ICD-10-CM | POA: Insufficient documentation

## 2024-12-22 DIAGNOSIS — M170 Bilateral primary osteoarthritis of knee: Secondary | ICD-10-CM | POA: Insufficient documentation

## 2024-12-22 DIAGNOSIS — G894 Chronic pain syndrome: Secondary | ICD-10-CM | POA: Diagnosis present

## 2024-12-22 DIAGNOSIS — M5136 Other intervertebral disc degeneration, lumbar region with discogenic back pain only: Secondary | ICD-10-CM | POA: Diagnosis present

## 2024-12-22 DIAGNOSIS — M47816 Spondylosis without myelopathy or radiculopathy, lumbar region: Secondary | ICD-10-CM | POA: Diagnosis present

## 2024-12-22 MED ORDER — HYDROCODONE-ACETAMINOPHEN 10-325 MG PO TABS: 1.0000 | ORAL_TABLET | Freq: Four times a day (QID) | ORAL | 0 refills | Status: AC | PRN

## 2024-12-22 NOTE — Progress Notes (Signed)
 Nursing Pain Medication Assessment:  Safety precautions to be maintained throughout the outpatient stay will include: orient to surroundings, keep bed in low position, maintain call bell within reach at all times, provide assistance with transfer out of bed and ambulation.  Medication Inspection Compliance: Pill count conducted under aseptic conditions, in front of the patient. Neither the pills nor the bottle was removed from the patient's sight at any time. Once count was completed pills were immediately returned to the patient in their original bottle.  Medication: Hydrocodone /APAP Pill/Patch Count: 30 of 120 pills/patches remain Pill/Patch Appearance: Markings consistent with prescribed medication Bottle Appearance: Standard pharmacy container. Clearly labeled. Filled Date: 37 / 24 / 2025 Last Medication intake:  Today

## 2024-12-22 NOTE — Progress Notes (Signed)
 PROVIDER NOTE: Interpretation of information contained herein should be left to medically-trained personnel. Specific patient instructions are provided elsewhere under Patient Instructions section of medical record. This document was created in part using AI and STT-dictation technology, any transcriptional errors that may result from this process are unintentional.  Patient: Vanessa Greer  Service: E/M   PCP: Fernande Ophelia JINNY DOUGLAS, MD  DOB: [birth year]  DOS: 12/22/2024  Provider: Emmy MARLA Blanch, NP  MRN: [id number]  Delivery: Face-to-face  Specialty: Interventional Pain Management  Type: Established Patient  Setting: Ambulatory outpatient facility  Specialty designation: 09  Referring Prov.: Fernande Ophelia JINNY DOUGLAS, MD  Location: Outpatient office facility       History of present illness (HPI) Vanessa Greer, a 63 y.o. year old female, is here today because of her Lumbar radiculopathy [M54.16]. Ms. Skilton primary complain today is Back Pain  Pertinent problems: Ms. Hornbaker has Chronic bilateral low back pain with bilateral sciatica; GERD without esophagitis; Lumbar facet arthropathy; Lumbar spondylosis; Lumbar degenerative disc disease; Chronic pain syndrome; Coccyodynia; Bilateral primary osteoarthritis of knee; Chronic bilateral thoracic back pain; Cervicalgia; Lumbar radiculopathy; and Medication management on their pertinent problem list.  Pain Assessment: Severity of Chronic pain is reported as a 4 /10. Location: Back Lower/Denies. Onset: More than a month ago. Quality: Aching. Timing: Intermittent. Modifying factor(s): Heat. Vitals:  height is 5' 4 (1.626 m) and weight is 152 lb (68.9 kg). Her temporal temperature is 97.3 F (36.3 C) (abnormal). Her blood pressure is 112/71 and her pulse is 59 (abnormal). Her respiration is 18 and oxygen saturation is 100%.  BMI: Estimated body mass index is 26.09 kg/m as calculated from the following:   Height as of this encounter: 5' 4 (1.626 m).   Weight  as of this encounter: 152 lb (68.9 kg).  Last encounter: 10/06/2024. Last procedure: Visit date not found.  Reason for encounter: medication management. No change in medical history since last visit.  Patient's pain is at baseline.  Patient continues multimodal pain regimen as prescribed.  States that it provides pain relief and improvement in functional status.   Discussed the use of AI scribe software for clinical note transcription with the patient, who gave verbal consent to proceed.  History of Present Illness   RAHEL Greer is a 64 year old female who presents with lower back pain.  She experiences pain primarily across her lower back with occasional burning sensation in the buttocks, especially when ascending stairs or inclines, which she associates with sciatic nerve pain. No recent injections or interventional therapy have been administered, although she received a knee injection in September. She took a pain pill at 6:30 AM today, which has made her pain tolerable at the moment. She reports that she has a knee replacement surgery planned for January 19th, as recommended by her orthopedic surgeon. Her current medication regimen includes a prescription that she takes every six hours as needed, with no reported side effects or adverse reactions.     Pharmacotherapy Assessment   Hydrocodone -acetaminophen  (Norco) 10-325 mg tablet every 8 hours as needed for pain. MME=30  Monitoring: [place] PMP: PDMP reviewed during this encounter.       Pharmacotherapy: No side-effects or adverse reactions reported. Compliance: No problems identified. Effectiveness: Clinically acceptable.  Vanessa Greer, [place]  12/22/2024  8:57 AM  Sign when Signing Visit Nursing Pain Medication Assessment:  Safety precautions to be maintained throughout the outpatient stay will include: orient to surroundings, keep bed in low  position, maintain call bell within reach at all times, provide assistance with transfer out of  bed and ambulation.  Medication Inspection Compliance: Pill count conducted under aseptic conditions, in front of the patient. Neither the pills nor the bottle was removed from the patient's sight at any time. Once count was completed pills were immediately returned to the patient in their original bottle.  Medication: Hydrocodone /APAP Pill/Patch Count: 30 of 120 pills/patches remain Pill/Patch Appearance: Markings consistent with prescribed medication Bottle Appearance: Standard pharmacy container. Clearly labeled. Filled Date: 58 / 24 / 2025 Last Medication intake:  Today    UDS:  Summary  Date Value Ref Range Status  10/06/2024 FINAL  Final    Comment:    ==================================================================== [employer] [facility] (MW) ==================================================================== Test                             Result       Flag       Units  Drug Present and Declared for Prescription Verification   Hydrocodone                     845          EXPECTED   ng/mg creat   Hydromorphone                  260          EXPECTED   ng/mg creat   Dihydrocodeine                 174          EXPECTED   ng/mg creat   Norhydrocodone                 >3497        EXPECTED   ng/mg creat    Sources of hydrocodone  include scheduled prescription medications.    Hydromorphone, dihydrocodeine and norhydrocodone are expected    metabolites of hydrocodone . Hydromorphone and dihydrocodeine are    also available as scheduled prescription medications.  ==================================================================== Test                      Result    Flag   Units      Ref Range   Creatinine              143              mg/dL      >=79 ==================================================================== Declared Medications:  The flagging and interpretation on this report are based on the  following declared medications.  Unexpected results may arise from   inaccuracies in the declared medications.   **Note: The testing scope of this panel includes these medications:   Hydrocodone    **Note: The testing scope of this panel does not include the  following reported medications:   Acetaminophen   Budesonide   Cyclobenzaprine   Formoterol   Furosemide  Multivitamin  Naloxone   Pantoprazole  Pregabalin  Sertraline  Simvastatin  Trazodone ==================================================================== For clinical consultation, please call [phone]. ====================================================================     No results found for: CBDTHCR No results found for: D8THCCBX No results found for: D9THCCBX  ROS  Constitutional: Denies any fever or chills Gastrointestinal: No reported hemesis, hematochezia, vomiting, or acute GI distress Musculoskeletal: Low back pain Neurological: No reported episodes of acute onset apraxia, aphasia, dysarthria, agnosia, amnesia, paralysis, loss of coordination, or loss of consciousness  Medication Review  HYDROcodone -acetaminophen , budesonide -formoterol ,  cyclobenzaprine , furosemide, multivitamin with minerals, naloxone , pantoprazole, pregabalin, sertraline, simvastatin, and traZODone  History Review  Allergy: Ms. Vanderzanden is allergic to bupropion. Drug: Ms. Krus  reports no history of drug use. Alcohol:  reports that she does not currently use alcohol. Tobacco:  reports that she has been smoking cigarettes. She started smoking about 47 years ago. She has a 47 pack-year smoking history. She has never used smokeless tobacco. Social: Ms. Longan  reports that she has been smoking cigarettes. She started smoking about 47 years ago. She has a 47 pack-year smoking history. She has never used smokeless tobacco. She reports that she does not currently use alcohol. She reports that she does not use drugs. Medical:  has a past medical history of Anxiety, Back pain, DDD (degenerative disc  disease), lumbar, Depression, GERD (gastroesophageal reflux disease), High cholesterol, Lumbar radiculitis, Pneumothorax (1982), and Sleep apnea. Surgical: Ms. Stofer  has a past surgical history that includes Abdominal hysterectomy; Foot surgery; Ectopic pregnancy surgery; Colonoscopy; Laser ablation condolamata (N/A, 12/13/2017); Esophagogastroduodenoscopy (egd) with propofol  (N/A, 03/17/2019); and Trigger finger release (Right). Family: family history includes Breast cancer (age of onset: 54) in her mother; Heart disease in her father.  Laboratory Chemistry Profile   Renal No results found for: BUN, CREATININE, LABCREA, BCR, GFR, GFRAA, GFRNONAA, LABVMA, EPIRU, EPINEPH24HUR, NOREPRU, NOREPI24HUR, DOPARU, [id number]  Hepatic No results found for: AST, ALT, ALBUMIN, ALKPHOS, HCVAB, AMYLASE, LIPASE, AMMONIA  Electrolytes No results found for: NA, K, CL, CALCIUM, MG, PHOS  Bone No results found for: VD25OH, [id number], CI6874NY7, CI7874NY7, 25OHVITD1, 25OHVITD2, 25OHVITD3, TESTOFREE, TESTOSTERONE  Inflammation (CRP: Acute Phase) (ESR: Chronic Phase) No results found for: CRP, ESRSEDRATE, LATICACIDVEN       Note: Above Lab results reviewed.  Recent Imaging Review  CT CHEST LCS NODULE F/U LOW DOSE WO CONTRAST CLINICAL DATA:  63 year old female current smoker 72 pack-year smoking history, presenting for short-term follow-up  EXAM: CT CHEST WITHOUT CONTRAST FOR LUNG CANCER SCREENING NODULE FOLLOW-UP  TECHNIQUE: Multidetector CT imaging of the chest was performed following the standard protocol without IV contrast.  RADIATION DOSE REDUCTION: This exam was performed according to the departmental dose-optimization program which includes automated exposure control, adjustment of the mA and/or kV according to patient size and/or use of iterative reconstruction technique.  COMPARISON:  09/13/2023 screening  chest CT  FINDINGS: Cardiovascular: Normal heart size. Stable trace pericardial effusion/thickening. Atherosclerotic nonaneurysmal thoracic aorta. Normal caliber pulmonary arteries.  Mediastinum/Nodes: No significant thyroid nodules. Unremarkable esophagus. No pathologically enlarged axillary, mediastinal or hilar lymph nodes, noting limited sensitivity for the detection of hilar adenopathy on this noncontrast study.  Lungs/Pleura: No pneumothorax. No pleural effusion. Mild paraseptal and centrilobular emphysema with diffuse bronchial wall thickening. No acute consolidative airspace disease or lung masses. Previously visualized pulmonary nodules are either stable or resolved, with no significant growth of any of the previous nodules, the largest remaining of which is 3.9 mm in the subpleural anterior lingula on image 202. New indistinct subpleural peripheral left upper lobe 3.8 mm nodule on image 86. No additional new significant pulmonary nodules.  Upper abdomen: Right adrenal 2.4 cm nodule with density -3 HU, stable and compatible with an adenoma.  Musculoskeletal: No aggressive appearing focal osseous lesions. Mild thoracic spondylosis.  IMPRESSION: 1. Lung-RADS 2, benign appearance or behavior. Continue annual screening with low-dose chest CT without contrast in 12 months. 2. Stable right adrenal adenoma, for which no follow-up imaging is recommended. 3. Aortic Atherosclerosis (ICD10-I70.0) and Emphysema (ICD10-J43.9).  Electronically Signed   By: Selinda  A Poff M.D.   On: 08/28/2024 19:01 Note: Reviewed        Physical Exam  Vitals: BP 112/71 (BP Location: Right Arm, Patient Position: Sitting, Cuff Size: Normal)   Pulse (!) 59   Temp (!) 97.3 F (36.3 C) (Temporal)   Resp 18   Ht 5' 4 (1.626 m)   Wt 152 lb (68.9 kg)   SpO2 100%   BMI 26.09 kg/m  BMI: Estimated body mass index is 26.09 kg/m as calculated from the following:   Height as of this encounter: 5' 4  (1.626 m).   Weight as of this encounter: 152 lb (68.9 kg). Ideal: Ideal body weight: 54.7 kg (120 lb 9.5 oz) Adjusted ideal body weight: 60.4 kg (133 lb 2.5 oz) General appearance: Well nourished, well developed, and well hydrated. In no apparent acute distress Mental status: Alert, oriented x 3 (person, place, & time)       Respiratory: No evidence of acute respiratory distress Eyes: PERLA  Musculoskeletal: +LBP Assessment   Diagnosis Status  1. Lumbar radiculopathy   2. Medication management   3. Chronic pain syndrome   4. Lumbar spondylosis   5. Bilateral primary osteoarthritis of knee   6. Chronic bilateral thoracic back pain   7. Cervicalgia   8. Degeneration of intervertebral disc of lumbar region with discogenic back pain    Controlled Controlled Controlled   Updated Problems: No problems updated.  Plan of Care  Problem-specific:  Assessment and Plan    Chronic pain syndrome Pain in lower back and buttocks, tolerable with medication. - Continue current pain medication regimen with refills from January to April. - Provided form for short-term pain management post-knee surgery.  Lumbar radiculopathy Symptoms suggest lumbar radiculopathy, recent intervention with injection administered by Dr. Lateef (for knee).  Degeneration of intervertebral disc of lumbar region with discogenic back pain and lower extremity pain Disc degeneration causing back and lower extremity pain, managed with medication.   Medication management: Patient's pain is controlled with hydrocodone -acetaminophen  (Norco), will continue on current medication regimen.  Prescribing drug monitoring (PDMP) reviewed, findings consistent with the use of prescribed medication no evidence of narcotic misuse or abuse.  Urine drug screening (UDS) up to date.  No side effects or adverse reaction reported to medication.  Schedule follow-up in 90 days for medication management.     Ms. BELVA KOZIEL has a  current medication list which includes the following long-term medication(s): budesonide -formoterol , furosemide, lyrica, sertraline, simvastatin, trazodone, [START ON 01/22/2025] hydrocodone -acetaminophen , [START ON 02/21/2025] hydrocodone -acetaminophen , and [START ON 03/23/2025] hydrocodone -acetaminophen .  Pharmacotherapy (Medications Ordered): Meds ordered this encounter  Medications   HYDROcodone -acetaminophen  (NORCO) 10-325 MG tablet    Sig: Take 1 tablet by mouth every 6 (six) hours as needed for severe pain (pain score 7-10). Must last 30 days. For chronic pain.    Dispense:  120 tablet    Refill:  0    [place] STOP ACT - Not applicable. Fill one day early if pharmacy is closed on scheduled refill date.   HYDROcodone -acetaminophen  (NORCO) 10-325 MG tablet    Sig: Take 1 tablet by mouth every 6 (six) hours as needed for severe pain (pain score 7-10). Must last 30 days. For chronic pain.    Dispense:  120 tablet    Refill:  0    [place] STOP ACT - Not applicable. Fill one day early if pharmacy is closed on scheduled refill date.   HYDROcodone -acetaminophen  (NORCO) 10-325 MG tablet    Sig: Take 1  tablet by mouth every 6 (six) hours as needed for severe pain (pain score 7-10). Must last 30 days. For chronic pain.    Dispense:  120 tablet    Refill:  0    [place] STOP ACT - Not applicable. Fill one day early if pharmacy is closed on scheduled refill date.   Orders:  No orders of the defined types were placed in this encounter.       Return in about 3 months (around 03/22/2025) for (F2F), (MM), Emmy Blanch NP.    Recent Visits Date Type Provider Dept  10/06/24 Office Visit Zaelynn Fuchs K, NP [facility]  09/28/24 Procedure visit Marcelino Nurse, MD [facility]  Showing recent visits within past 90 days and meeting all other requirements Today's Visits Date Type Provider Dept  12/22/24 Office Visit Vickee Mormino K, NP [facility]  Showing today's visits and meeting all  other requirements Future Appointments Date Type Provider Dept  03/15/25 Appointment Xylan Sheils K, NP [facility]  Showing future appointments within next 90 days and meeting all other requirements  I discussed the assessment and treatment plan with the patient. The patient was provided an opportunity to ask questions and all were answered. The patient agreed with the plan and demonstrated an understanding of the instructions.  Patient advised to call back or seek an in-person evaluation if the symptoms or condition worsens.  I personally spent a total of 30 minutes in the care of the patient today including preparing to see the patient, getting/reviewing separately obtained history, performing a medically appropriate exam/evaluation, counseling and educating, placing orders, referring and communicating with other health care professionals, documenting clinical information in the EHR, independently interpreting results, communicating results, and coordinating care.   Note by: Presley Gora K Eduin Friedel, NP (TTS and AI technology used. I apologize for any typographical errors that were not detected and corrected.) Date: 12/22/2024; Time: 9:29 AM

## 2024-12-22 NOTE — Patient Instructions (Signed)
 ______________________________________________________________________    Opioid Pain Medication Update  To: All patients taking opioid pain medications. (I.e.: hydrocodone , hydromorphone, oxycodone , oxymorphone, morphine, codeine, methadone, tapentadol, tramadol, buprenorphine, fentanyl , etc.)  Re: Updated review of side effects and adverse reactions of opioid analgesics, as well as new information about long term effects of this class of medications.  Direct risks of long-term opioid therapy are not limited to opioid addiction and overdose. Potential medical risks include serious fractures, breathing problems during sleep, hyperalgesia, immunosuppression, chronic constipation, bowel obstruction, myocardial infarction, and tooth decay secondary to xerostomia.  Unpredictable adverse effects that can occur even if you take your medication correctly: Cognitive impairment, respiratory depression, and death. Most people think that if they take their medication correctly, and as instructed, that they will be safe. Nothing could be farther from the truth. In reality, a significant amount of recorded deaths associated with the use of opioids has occurred in individuals that had taken the medication for a long time, and were taking their medication correctly. The following are examples of how this can happen: Patient taking his/her medication for a long time, as instructed, without any side effects, is given a certain antibiotic or another unrelated medication, which in turn triggers a Drug-to-drug interaction leading to disorientation, cognitive impairment, impaired reflexes, respiratory depression or an untoward event leading to serious bodily harm or injury, including death.  Patient taking his/her medication for a long time, as instructed, without any side effects, develops an acute impairment of liver and/or kidney function. This will lead to a rapid inability of the body to breakdown and eliminate  their pain medication, which will result in effects similar to an overdose, but with the same medicine and dose that they had always taken. This again may lead to disorientation, cognitive impairment, impaired reflexes, respiratory depression or an untoward event leading to serious bodily harm or injury, including death.  A similar problem will occur with patients as they grow older and their liver and kidney function begins to decrease as part of the aging process.  Background information: Historically, the original case for using long-term opioid therapy to treat chronic noncancer pain was based on safety assumptions that subsequent experience has called into question. In 1996, the American Pain Society and the American Academy of Pain Medicine issued a consensus statement supporting long-term opioid therapy. This statement acknowledged the dangers of opioid prescribing but concluded that the risk for addiction was low; respiratory depression induced by opioids was short-lived, occurred mainly in opioid-naive patients, and was antagonized by pain; tolerance was not a common problem; and efforts to control diversion should not constrain opioid prescribing. This has now proven to be wrong. Experience regarding the risks for opioid addiction, misuse, and overdose in community practice has failed to support these assumptions.  According to the Centers for Disease Control and Prevention, fatal overdoses involving opioid analgesics have increased sharply over the past decade. Currently, more than 96,700 people die from drug overdoses every year. Opioids are a factor in 7 out of every 10 overdose deaths. Deaths from drug overdose have surpassed motor vehicle accidents as the leading cause of death for individuals between the ages of 69 and 14.  Clinical data suggest that neuroendocrine dysfunction may be very common in both men and women, potentially causing hypogonadism, erectile dysfunction, infertility,  decreased libido, osteoporosis, and depression. Recent studies linked higher opioid dose to increased opioid-related mortality. Controlled observational studies reported that long-term opioid therapy may be associated with increased risk for cardiovascular events. Subsequent  meta-analysis concluded that the safety of long-term opioid therapy in elderly patients has not been proven.   Side Effects and adverse reactions: Common side effects: Drowsiness (sedation). Dizziness. Nausea and vomiting. Constipation. Physical dependence -- Dependence often manifests with withdrawal symptoms when opioids are discontinued or decreased. Tolerance -- As you take repeated doses of opioids, you require increased medication to experience the same effect of pain relief. Respiratory depression -- This can occur in healthy people, especially with higher doses. However, people with COPD, asthma or other lung conditions may be even more susceptible to fatal respiratory impairment.  Uncommon side effects: An increased sensitivity to feeling pain and extreme response to pain (hyperalgesia). Chronic use of opioids can lead to this. Delayed gastric emptying (the process by which the contents of your stomach are moved into your small intestine). Muscle rigidity. Immune system and hormonal dysfunction. Quick, involuntary muscle jerks (myoclonus). Arrhythmia. Itchy skin (pruritus). Dry mouth (xerostomia).  Long-term side effects: Chronic constipation. Sleep-disordered breathing (SDB). Increased risk of bone fractures. Hypothalamic-pituitary-adrenal dysregulation. Increased risk of overdose.  RISKS: Respiratory depression and death: Opioids increase the risk of respiratory depression and death.  Drug-to-drug interactions: Opioids are relatively contraindicated in combination with benzodiazepines, sleep inducers, and other central nervous system depressants. Other classes of medications (i.e.: certain antibiotics  and even over-the-counter medications) may also trigger or induce respiratory depression in some patients.  Medical conditions: Patients with pre-existing respiratory problems are at higher risk of respiratory failure and/or depression when in combination with opioid analgesics. Opioids are relatively contraindicated in some medical conditions such as central sleep apnea.   Fractures and Falls:  Opioids increase the risk and incidence of falls. This is of particular importance in elderly patients.  Endocrine System:  Long-term administration is associated with endocrine abnormalities (endocrinopathies). (Also known as Opioid-induced Endocrinopathy) Influences on both the hypothalamic-pituitary-adrenal axis?and the hypothalamic-pituitary-gonadal axis have been demonstrated with consequent hypogonadism and adrenal insufficiency in both sexes. Hypogonadism and decreased levels of dehydroepiandrosterone sulfate have been reported in men and women. Endocrine effects include: Amenorrhoea in women (abnormal absence of menstruation) Reduced libido in both sexes Decreased sexual function Erectile dysfunction in men Hypogonadisms (decreased testicular function with shrinkage of testicles) Infertility Depression and fatigue Loss of muscle mass Anxiety Depression Immune suppression Hyperalgesia Weight gain Anemia Osteoporosis Patients (particularly women of childbearing age) should avoid opioids. There is insufficient evidence to recommend routine monitoring of asymptomatic patients taking opioids in the long-term for hormonal deficiencies.  Immune System: Human studies have demonstrated that opioids have an immunomodulating effect. These effects are mediated via opioid receptors both on immune effector cells and in the central nervous system. Opioids have been demonstrated to have adverse effects on antimicrobial response and anti-tumour surveillance. Buprenorphine has been demonstrated to have  no impact on immune function.  Opioid Induced Hyperalgesia: Human studies have demonstrated that prolonged use of opioids can lead to a state of abnormal pain sensitivity, sometimes called opioid induced hyperalgesia (OIH). Opioid induced hyperalgesia is not usually seen in the absence of tolerance to opioid analgesia. Clinically, hyperalgesia may be diagnosed if the patient on long-term opioid therapy presents with increased pain. This might be qualitatively and anatomically distinct from pain related to disease progression or to breakthrough pain resulting from development of opioid tolerance. Pain associated with hyperalgesia tends to be more diffuse than the pre-existing pain and less defined in quality. Management of opioid induced hyperalgesia requires opioid dose reduction.  Cancer: Chronic opioid therapy has been associated with an increased risk  of cancer among noncancer patients with chronic pain. This association was more evident in chronic strong opioid users. Chronic opioid consumption causes significant pathological changes in the small intestine and colon. Epidemiological studies have found that there is a link between opium dependence and initiation of gastrointestinal cancers. Cancer is the second leading cause of death after cardiovascular disease. Chronic use of opioids can cause multiple conditions such as GERD, immunosuppression and renal damage as well as carcinogenic effects, which are associated with the incidence of cancers.   Mortality: Long-term opioid use has been associated with increased mortality among patients with chronic non-cancer pain (CNCP).  Prescription of long-acting opioids for chronic noncancer pain was associated with a significantly increased risk of all-cause mortality, including deaths from causes other than overdose.  Reference: Von Korff M, Kolodny A, Deyo RA, Chou R. Long-term opioid therapy reconsidered. Ann Intern Med. 2011 Sep 6;155(5):325-8. doi:  10.7326/[id number]. PMID: 78106373; PMCID: [id number]. Kit JINNY Laurence CINDERELLA Pearley JINNY, Hayward RA, Dunn KM, Jordan KP. Risk of adverse events in patients prescribed long-term opioids: A cohort study in the [place] Clinical Practice Research [employer]. Eur J Pain. 2019 May;23(5):908-922. doi: 10.1002/ejp.1357. Epub 2019 Jan 31. PMID: 69379883. Colameco S, Coren JS, Ciervo CA. Continuous opioid treatment for chronic noncancer pain: a time for moderation in prescribing. Postgrad Med. 2009 Jul;121(4):61-6. doi: 10.3810/pgm.[id number]. PMID: 80358728. Gigi JONELLE Shlomo MILUS Levern IVER Conny RN, [place], Blazina I, Lonell DASEN, Bougatsos C, Deyo RA. The effectiveness and risks of long-term opioid therapy for chronic pain: a systematic review for a [employer] of [employer] Pathways to [employer]. Ann Intern Med. 2015 Feb 17;162(4):276-86. doi: 10.7326/M14-2559. PMID: 74418742. Rory CHRISTELLA Laurence [facility], [place] DM. NCHS Data Brief No. 22. [place]: Centers for Disease Control and Prevention; 2009. Sep, Increase in Fatal Poisonings Involving Opioid Analgesics in the [place] , 1999-2006. Song IA, Choi HR, Oh TK. Long-term opioid use and mortality in patients with chronic non-cancer pain: Ten-year follow-up study in [place] from 2010 through 2019. EClinicalMedicine. 2022 Jul 18;51:101558. doi: 10.1016/j.eclinm.[id number]. PMID: 64124182; PMCID: [id number]. Huser, W., Schubert, T., Vogelmann, T. et al. All-cause mortality in patients with long-term opioid therapy compared with non-opioid analgesics for chronic non-cancer pain: a database study. BMC Med 18, 162 (2020). [email or web address].info/ Rashidian H, Zendehdel K, Kamangar F, Malekzadeh R, Haghdoost AA. An Ecological Study of the Association between Opiate Use and Incidence of Cancers. Addict Health. 2016 Fall;8(4):252-260. PMID: 71180443; PMCID: [id number].  Our Goal: Our goal is to control your pain with means other  than the use of opioid pain medications.  Our Recommendation: Talk to your physician about coming off of these medications. We can assist you with the tapering down and stopping these medicines. Based on the new information, even if you cannot completely stop the medication, a decrease in the dose may be associated with a lesser risk. Ask for other means of controlling the pain. Decrease or eliminate those factors that significantly contribute to your pain such as smoking, obesity, and a diet heavily tilted towards inflammatory nutrients.  Last Updated: 07/08/2023   ______________________________________________________________________       ______________________________________________________________________    Update on Controlled Substance (Opioid) Regulations   To: All patients taking opioid pain medications. (I.e.: hydrocodone , hydromorphone, oxycodone , oxymorphone, morphine, codeine, methadone, tapentadol, tramadol, buprenorphine, fentanyl , etc.)  Re: Review on the state of controlled substance regulations.  Introduction: Rules and regulations associated with all aspects of controlled substances are constantly being modified. Unfortunately we have encountered patients questioning the veracity of the information  that we provide them about these changes. This is intended to provide them with appropriate references and a historical review of these changes.  A Brief History: As of October 15, 2016, the [place]  Government declared the opioid epidemic a public health emergency. Prescription drug monitoring programs (PDMPs) and the [facility] All Schedules Prescription Electronic Reporting Act (NASPER). Before 1800, clinicians regarded pain as an existential phenomenon, a consequence of aging. There was no regulation on the use of cocaine and opioids, resulting in widespread marketing and prescribing for many ailments ranging from diarrhea to toothache. The [employer] of  [phone], passed in response to the sudden emergence of street heroin abuse as well as iatrogenic morphine dependence, influenced both physician and patient alike to avoid opiates. Patients with unexplained pain in the 1920s were regarded as deluded, malingering, or abusers, and cancer patients through the 1950s were encouraged to wean themselves off opioids until their lives could be measured in weeks. Alongside this opioid evolution, the American Pain Society launched their influential pain as the fifth vital sign campaign in 1995. Concurrently, pharmaceutical companies introduced new formulations, such as extended release oxycodone  (OxyContin ). From 1997 to 2002, OxyContin  prescriptions increased from 670,000 to 6.2 million. However, concerns soon began to surface regarding overzealous opioid treatment. It must be noted that pharmaceutical companies contributed significantly to the rise of the opioid epidemic, receiving considerable reprimands as a consequence. In 2007, as the opioid epidemic began to inflict profound damage, [employer] pleaded guilty to federal charges related to the misbranding of OxyContin . [employer] agreed to pay a total of $634.5 million to resolve Justice Department investigations, as well as a $19.5 million settlement to [address] and the [address] of [place].  In response to the current epidemic, changes in focus to the development of new abuse deterrent opioid formulations at the [place]  Food and Drug Administration (FDA) as well as drafting of new public standards for pain treatment were created at [facility] in 2017. In response to the opioid epidemic, FDA public policy changes were announced in February 2016. Among these new positions were a re-examination of the risk-benefit paradigm for opioids with strict emphasis on the large public health ramifications. The various modified opioids released over the past 20 years, such as tamper-resistant preparation, have had differing levels of success,  and are collectively referred to as Risk Evaluation and Mitigation Strategies (REMS). There is also a growing focus on preventing opioid use disorder (OUD) and on offering affected individuals accessible and effective treatment. [place]  government policy reflects these changes and both the Affordable Care Act and the Mental Health Parity and Addiction Equity Act were major steps forward in treating opioid addiction. The Affordable Care Act, which was signed into law in 2010, with major provisions coming into effect by 2014.  In the 1990s, the intensified marketing of newly reformulated prescription opioid medications (e.g., OxyContin ) and an influential pain advocacy campaign that encouraged greater pain management led to a precipitous rise in opioid use in the [place] . Research from the Centers for Disease Control and Prevention (CDC) shows that prescription opioid sales in the [place]  quadrupled from 1999 to 2010. At the same time, opioid misuse and opioid-involved overdose deaths increased (Figure 1). Between 1999 and 2010, the rate of opioidinvolved overdose deaths in the [place]  doubled from 2.9 to 6.8 deaths per 100,000 people. This initial rise in opioid-related deaths is often referred to as the first wave of the recent opioid crisis.  Between 1999 and 2020, 565,000  Americans died of opioid-involved overdoses. In turn, federal, state, and local governments responded with various legal and policy efforts to curb opioid misuse and drug-related overdose Deaths.  Recent Congresses have enacted several laws addressing the opioid crisis, such as the Comprehensive Addiction and Recovery Act of 2016 (CARA, P.L. 114-198); the 21st Century Cures Act (P.L. 114-255); the Substance UseDisorder Prevention that Promotes Opioid Recovery and Treatment for Patients and Communities Act (SUPPORT Act, P.L. [phone]); the Fentanyl  Sanctions Act (Title LXXII of P.L. [id number]); and the Blocking  Deadly Fentanyl  Imports Act (P.L. 117-81, 6610). These laws addressed overprescribing and misuse of opioids, expanded substance use disorder prevention and treatment capacities, bolstered drug diversion capabilities, and enhanced international drug interdiction, counternarcotics cooperation, and sanctions efforts. Congress also directed additional funds to many of these initiatives through appropriations.  Congress provided funding in the [employer] Act of 2021 [phone]; P.L. 117-2) for syringe services programs (often known as needle exchange programs) and other harm reduction initiatives. Federal and state harm reduction strategies have frequently involved the distribution of naloxone  (e.g., Narcan )--a medication used to reverse an opioid overdose--and test strips used to detect fentanyl  in drug samples.  The Department of Justice (DOJ) and Department of Homeland Security [facility]) aim to reduce the diversion of prescription opioids and the use, manufacturing, and trafficking of illicit opioids. DOJ--via the Drug Enforcement Administration (DEA)--regulates opioid manufacturers, distributors, and dispensers; it also controls the opioid supply through enforcement of regulatory requirements.  A History of Opiate Laws in the [place]   Prior to 1890, laws concerning opiates were strictly imposed on a local city or state-by-state basis. One of the first was in [place] in 1875 where it became illegal to smoke opium only in opium dens. It did not ban the sale, import or use otherwise. In the next 25 years different states enacted opium laws ranging from outlawing opium dens altogether to making possession of opium, morphine and heroin without a physicians prescription illegal.  The first Congressional Act took place in 1890 that levied taxes on morphine and opium. From that time on the [employer] has had a series of laws and acts directly aimed at opiate use, abuse  and control. These are outlined below:  1906 - Pure Food and Drug Act Preventing the manufacture, sale, or transportation of adulterated or misbranded or poisonous or deleterious foods, drugs, medicines, and liquors, and for regulating traffic therein, and for other purposes. Punishment included fines and prison time.  1909   - Smoking Opium Exclusion Act Banned the importation, possession and use of smoking opium. Did not regulate opium-based medications. First Freight forwarder banning the non-medical use of a substance.  1914  - The Margrette Act In summary, The Margrette Act of 1914 was written more to have all parties involved in importing, exporting, set designer and distributing opium or cocaine to register with the [employer] and have taxes levied upon them. Exempt from the law were physicians operating in the course of his professional practice  1919 - Supreme Court ratified the Bj's in [place]., v. [place]  and [place]  v. [place], then again in [facility] v. [place] , in 1920, holding that doctors may not prescribe maintenance supplies of narcotics to people addicted to narcotics. However, it does not prohibit doctors from prescribing narcotics to wean a patient off of the drug. It was also the opinion of the court that prescribing narcotics to habitual users was not considered professional practice hence it  then was considered illegal for doctors to prescribe opioids for the purposes of maintaining an addiction. It can be argued that todays addiction medications are not intended to maintain an addiction but to facilitate addiction remission. In which case, this opinion of the court should not preclude practitioners from prescribing buprenorphine or methadone to patients suffering from an addictive disorder.  1924  - Heroin Act Architectural technologist, importation and possession of heroin illegal - even for medicinal use.  1922 -- Narcotic  Drug Import and Export Act Enacted to assure proper control of importation, sale, possession, production and consumption of narcotics.  1927  -- Special Educational Needs Teacher of [employer] of Prohibition was responsible for tracking bootleggers and organized conservation officer, historic buildings. They focused primarily on interstate and international cases and those cases where local law enforcement official would not or could not act.  1932 -- Uniform State Narcotic Act Encouraged states to pass uniform state laws matching the federal Narcotic Drug Import and Export Act. Suggested prohibiting cannabis use at the state level.  27 -- Food, Drug, and Cosmetic Act The new law brought cosmetics and medical devices under control, and it required that drugs be labeled with adequate directions for safe use. Moreover, it mandated pre-market approval of all new drugs, such that a manufacturer would have to prove to FDA that a drug were safe before it could be sold  1951 -- Boggs Act Imposed maximum criminal penalties for violations of the import/export and internal revenue laws related to drugs and also established mandatory minimum prison sentences.  1956 -- Narcotics Control Act Increased Boggs Act penalties and mandatory prison sentence minimums for violations of existing drug laws.  1965 -- Drug Abuse Control Amendment Enacted to deal with problems caused by abuse of depressants, stimulants and hallucinogens. Restricted research into psychoactive drugs such as LSD by requiring FDA approval.  1970 -- Controlled Substance Act  Controlled Substances Import and Export Act These laws are a consolidation of numerous laws regulating the manufacture and distribution of narcotics, stimulants, depressants, hallucinogens, anabolic steroids, and chemicals used in the illicit production of controlled substances. The CSA places all substances that are regulated under existing federal law into one of five schedules. This placement is based upon  the substance's medicinal value, harmfulness, and potential for abuse or addiction. Schedule I is reserved for the most dangerous drugs that have no recognized medical use, while Schedule V is the classification used for the least dangerous drugs. The act also provides a mechanism for substances to be controlled, added to a schedule, decontrolled, removed from control, rescheduled, or transferred from one schedule to another.  82 - Drug Enforcement Agency By Executive Order, the DEA was formed to take place of the [employer] of Narcotics and Dangerous Drugs.  46 -Narcotic Addict Treatment Act of  1974  - Public Law [phone] Amends the Controlled Substance Act of 1970 to provide for the registration of practitioners conducting narcotic treatment programs. [methadone clinics] It also provides legal definitions for the phrases maintenance treatment and detoxification treatment.  1986 -- Anti-Drug Abuse Act of 1986 Strengthened Federal efforts to encourage foreign cooperation in eradicating illicit drug crops and in halting international drug traffic, to improve enforcement of Federal drug laws and enhance interdiction of illicit drug shipments, to provide strong Market researcher in establishing effective drug abuse prevention and education programs, to [employer] support for drug abuse treatment and rehabilitation efforts, and for other purposes. It also re-imposed mandatory sentencing minimums depending on which drug and how  much was involved.  1988 -- Anti-Drug Abuse Act of 1988 Established the Office of Materials Engineer (ONDCP) in the [employer] of the Economist; authorized funds for [employer], state and local drug enforcement activities, school-based drug prevention efforts, and drug abuse treatment with special emphasis on injecting drug abusers at high risk for AIDS.  2000 -- Federal - The Drug Addiction Treatment Act of 2000 (DATA 2000) It enables qualified physicians to  prescribe and/or dispense narcotics for the purpose of treating opioid dependency. For the first time, physicians are able to treat this disease from their private offices or other clinical settings. This presents a very desirable treatment option for those who are unwilling or unable to seek help in drug treatment clinics. Patients can now be treated in the privacy of their doctors office, as are other people being treated for any other type of medical condition. One medicine doctors may now prescribe is Buprenorphine. The major downfall of this Act is the limitation of 30 patients per practice - which means that large facilities, no matter how many physicians are there, can only treat 30 patients at a time.  2002-- DEA reschedules buprenorphine from a schedule V drug to a schedule III drug, on October 06, 2001 - the day before the FDA approval of Suboxone and Subutex despite overwhelming objection by the medical community.  2004: June 2004 THE CONFIDENTIALITY OF ALCOHOL  AND DRUG ABUSE PATIENT RECORDS REGULATION AND THE HIPAA PRIVACY RULE:  Confidentiality of Alcohol  and Drug Dependence Patient Records (summary) Code of Federal Regulations Title 42 Part 2 (42 CFR Part 2)  The confidentiality of alcohol  and drug dependence patient records maintained by this practice/program is protected by federal law and regulations. Generally, the practice/program may not say to a person outside the practice/program that a patient attends the practice/program, or disclose any information identifying a patient as being alcohol  or drug dependent unless:  The patient consents in writing; The disclosure is allowed by a court order, or The disclosure is made to medical personnel in a medical emergency or to qualified personnel for research,  audit, or practice/program evaluation. Violation of the federal law and regulations by a practice/program is a crime. Suspected violations may be reported to appropriate authorities  in accordance with federal regulations. Freight forwarder and regulations do not protect any information about a crime committed by a patient either at the practice/program or against any person who works for the practice/program or about any threat to commit such a crime. Federal laws and regulations do not protect any information about suspected child abuse or neglect from being reported under state law to appropriate state or local authorities.  sample consent form (MS-WORD)  2005: 08-01-2004 Public law [phone], Amends the Controlled Substances Act to eliminate the 30-patient limit for medical group practices allowed to dispense narcotic drugs in schedules III, IV, or V for maintenance or detoxification treatment (retains the 30-patient limit for an individual physician). This amendment removes the 30-patient limit on group medical practices that treat opioid dependence with buprenorphine. The restriction was part of the original Drug Addiction Treatment Act of 2000 (DATA) that allowed treatment of opioid dependence in a doctor's office. With this change, every certified doctor may now prescribe buprenorphine up to his or her individual physician limit of 30 patients.  2006: On 12/28/2005 President Levy signed Bill H.R.6344 into law. This allows physicians who have been certified to prescribe certain drugs for the treatment of opioid dependence under DATA2000 to treat up to  100 patients (up from 30) by submitting an intent notification to the Dept of Health and [employer]. This is a major step forward in both fighting the stigma and allowing access to treatment previously not available to some. For more details see 30/100-PATIENT LIMIT  2016: HHS augments regulations concerning the 30/100 patient limit by raising the limit to 275 for qualifying physicians. Link to summary of regulation  2016: Comprehensive Addiction and Recovery Act of 2016 (sec.303) amends the Controlled Substance Act - to allow Nurse  Practitioners and Physician Assistants to become eligible to prescribe buprenorphine for the treatment of opioid use disorder. See the entire law for more details.  The roots of the concurrent regulation of certain drugs under two statutory schemes go back to the beginning of this century. In 1906, Congress enacted the Pure Food and Drug Act, establishing one regime of regulation to assure (among other things) that drugs were not adulterated or misbranded. These regulations were amended several times, recodified in 1938, and expanded on again from the 1940s through the 1990s. Their implementation and enforcement is today assigned to the Food and Drug Administration (FDA) in the Department of Health and Human Services [facility]).  In 1914, Congress adopted the [place] Narcotic Act to stop abuse of addictive drugs. The [place] Narcotic Act was amended in 1937 to include marijuana. In 1965, amphetamines, barbiturates, and hallucinogens came under regulation, but under the [employer], Drug, and Cosmetic Act. In 1970, these various statutes were consolidated and recodified as the Controlled Substances Act (CSA), which has been amended several times since then. Its implementation and enforcement is today assigned to the Drug Enforcement Administration (DEA) in the Department of Justice.  The first clash occurred after World War I, when so-called morphine clinics existed and physicians prescribed or dispensed morphine to addicts. Some addicts were veterans of the American Civil War, the Spanish-American War, and WWI, who had become addicted during treatment for war wounds, but most of them came from the growing population of nonmedical addicts ([place], 8017). The Narcotics Division of the Prohibition Unit of the Department of the Treasury, which was then responsible for enforcing the [facility] Narcotic Act, concluded that this activity was not the legitimate practice of medicine but simple drug trafficking. The  Treasury Department swiftly closed the clinics and made it personally and professionally risky for physicians to maintain a narcotic addict for any reason. In did so, however, only after the American Medical Association had adopted a resolution, in 1920, opposing ambulatory clinics''.  In 1972, the public health establishment, including the Secretary of Health, Education, and Welfare, the Education Officer, Environmental, the [employer] of [employer], and the Chemical Engineer for Drug Abuse Prevention, was unprepared to allow [employer] of Narcotics and Dangerous Drugs, DEA's predecessor agency, to unilaterally define the parameters of medical practice for the use of methadone in the treatment of heroin addiction. As a consequence, a new set of rules--the third, on top of the FDA and DEA schemes--was added, one that inserted FDA deeply into the practice of medicine, notwithstanding its protestations to the contrary. Congress ratified this joint responsibility of law enforcement and public health officials for methadone through this third set of rules in 1974 with the passage of the Narcotic Addict Treatment Act (NATA). To examine in detail the evolution of this third set of rules--commonly referred to as the FDA or DHHS methadone regulations--we turn, first, to the period of the mid-1960s.  Increased use of heroin in the  post World War II period first became apparent in the early to mid 1950s. During the [employer], a minimum mandatory narcotics law was enacted in 1956, effective July 1957. 1962 [facility] conference on drug abuse, the [employer] on Narcotic and Drug Abuse (the [employer]) of 1963, the Drug Abuse Control Amendments of 1965, the President's Commission on [employer] and Administration of Justice (the [employer]) of [phone], and the Narcotic Addiction Rehabilitation Act of 1966.  The 1965 Drug Abuse Control Amendments  brought under strict federal control all nonnarcotic drugs capable of producing serious psychotoxic effects when abused. This act also created the [employer] of Drug Abuse Control within the Department of Health, Education, and Welfare (DHEW) and shifted the basis for aon corporation of illegal drugs from tax principles (administered by the Department of Treasury) to the regulation of commerce (administered by the [employer]ATION).  The 1966 Narcotic Addiction Rehabilitation Act TOUR MANAGER) authorized the civil commitment of narcotic addicts, and federal assistance to state and local governments to develop a local system of drug treatment programs. With respect to the latter, the [employer] of Mental Health [facility]) initially proposed the gradual implementation of the state assistance effort, mainly through a common mental health mechanism--inpatient treatment programs. However, because of a perceived pressing need, the courts began to commit addicts to these programs even before they were officially opened or staffed. The NARA legislation imposed the following contract requirements on treatment centers: (1) thrice-a-week counseling sessions; (2) weekly urine tests; (3) restorative dental services; (4) psychological consultations and vocational training; and (5) the treatment modalities of drug-free outpatient, therapeutic community, and methadone maintenance. Reorganization Plan No. 1 of 1968 transferred the primary functions of the [employer] of Narcotics (FBN) from the [employer] to the Department of Justice; it also transferred the [employer] of Drug Abuse Control functions to the Department of Justice. Within the [employer], the [employer] of Narcotics and Dangerous Drugs (BNDD) was created, which became the Drug Enforcement Administration in 1973.   Under the first [place] administration [phone]), federal drug abuse policy developed in a significant way. These developments included a 1969 war  on drugs presidential message, resulting legislation in 1970, and a Special Action Office created by executive order in 1971 and authorized in statute in 1972. Brynn, in 1969, to send a message to Congress on drug abuse. Although this was the first time that a U.S. president invoked the war on drugs image, it was in retrospect the most balanced approach to the problem of drug abuse that had been advanced. The 1969 message resulted in the submission of legislation to the Congress and the passage, the following year, of the Comprehensive Drug Abuse Prevention and Control Act of 1970 [employer] [phone], October 26, 1969). The act dealt with research, treatment, and prevention of drug abuse and drug dependence, and with drug abuse charity fundraiser. One major purpose of the 1970 legislation was to reverse some of the strictures of the [employer] of 1914. The 1970 act sought to clarify for the medical profession . . . the extent to which they may safely go in treating narcotic addicts as patients. Title I, in Section IV, charged the Surveyor, Minerals, Education, and Welfare, to determine the appropriate methods of professional practice in the medical treatment of the narcotic addiction of various classes of narcotic addicts. This provision constitutes the initial statutory basis for treatment standards. The law enforcement sections consolidated all prior federal statutes into the  Controlled Substances Act and the Controlled Substances Export and Import Act (Titles II and III, respectively, of the Comprehensive Drug Abuse Prevention and Control Act of 1970). Under this legislation, substances were classified under five schedules according to their abuse potential, and psychological and physical effects. Methadone was placed in Schedule II, along with such opiate drugs as morphine, codeine, and hydrocodone .  One of the most important steps taken by President Brynn was to establish in June 1971 the  Special Action Office for Drug Abuse Prevention (SAODAP) in the [employer] of the President ([place] [phone], June 16, 1970). In mid-1971, [facility] appointed Dr. Maple Dunnings as SAODAP director. Within a year, the Drug Abuse Prevention Office and Treatment Act of 1972 [employer] [phone], March 21, 1971) gave statutory authority to [facility], but limiting setting, on June 29, 1974, as the limit on its existence.  The purpose of the 1972 act was to bring the resources of the federal government to bear on drug abuse with the immediate objective of significantly reducing its incidence and developing a comprehensive, coordinated long-term federal strategy to combat drug abuse.  Narcotic Addict Treatment Act [employer]) of 1974 [employer] [phone]), which amended the Controlled Substances Act. This legislation was driven by concern for the diversion of methadone to illicit channels that was occurring in 1972 and 1973, as reflected in the title of the Senate bill adopted on June 07, 1972, the Methadone Diversion Control Act of 1973. (U.S. Senate, 1970a, 8029a).  The 1980 final rule (45 FR 37305, September 19, 1979) reduced the minimum standard for admission from two years of addiction to one year coupled with a clinical determination that the individual was currently physiologically.  The regulations were next revised in 1989, following two proposals to modify them, one in 1983 and one in 1987.  Under President Tanda Corrente, a government-wide effort was made to review all federal government regulations and to eliminate or reduce the burden of these regulations on the private sector, state and nash-finch company, and wps resources.   The 1983 recommendations, though not adopted, did initiate another revision of the methadone regulations, which first found expression in a 1987 proposed rule (52 FR 37047, October 01, 1986) and culminated in a final rule (54 FR 8954, March 01, 1988) at the end of the  decade. In the 1987 proposed rule, the FDA and NIDA, in an effort to put the best face on the unenthusiastic 1983 response by the provider community to converting the regulations to guidelines, indicated that they had retained the current requirements necessary to achieve the goals of the 1974 NATA, but were proposing to streamline the regulation and to promote more efficient operation of methadone programs. The 1987 proposed rule, issued by the FDA and NIDA, advanced the following changes in the methadone regulation: that detoxification treatment be divided into short-term (<21 days) and long-term (>21 and <180 days) treatment; that the minimum staffing ratio of one counselor to 50 patients be eliminated; that blood tests be allowed as ways to conduct initial drug screening or to meet the monthly testing requirements for six-day take-home patients; that the 72-hour notification of FDA and the pertinent state authority for methadone doses greater than 100 mg be eliminated; that special adverse reaction reporting requirements for methadone be eliminated and reliance placed upon general FDA reporting requirements; that a supervising counselor be allowed to conduct the annual review of the patient's treatment plan for certain qualified patients who had been in treatment for 3 years  or longer; and that the requirement of an annual report of methadone treatment programs to the FDA be dropped. The FDA and NIDA issued a final rule on March 01, 1988, based on comments on the 1987 proposed (54 FR 8954). Concurrently, FDA and NIDA issued a six-page guidance document, which noted that the regulations, over time, had recommended certain practices that were not actually required. Public Health Service, in Congress, and elsewhere, to reorganize the Alcohol , Drug Abuse, and Mental Health Administration (ADAMHA). These efforts culminated in the [employer] of 1992 [employer] [phone], July 10, 1991), the main  purpose of which was to transfer the research portions of the three ADAMHA institutes--NIDA, the [employer] Mills of Alcoholism and Alcohol  Abuse, and the [employer] Mills of Mental Health--to the [employer] and to create the Substance Abuse and Mental Health Services Administration [facility]) as the home for the service functions of these entitles.  Guidelines for Opioid Treatment The Federal Guidelines for Opioid Treatment Programs - 2015 serve as a guide to accrediting organizations for developing accreditation standards. The guidelines also provide OTPs with information on how programs can achieve and maintain compliance with federal regulations. The 2015 guidelines are an update to the 2007 Guidelines for the Accreditation of Opioid Treatment Programs (PDF  547 KB). The new document reflects the obligation of OTPs to deliver care consistent with the patient-centered, integrated, and recovery-oriented standards of substance use treatment.  DPT oversees the certification of OTPs and provides guidance to nonprofit organizations and state governmental entities that want to become a SAMHSA-approved accrediting body. Learn more about the accreditation and certification of OTPs and [facility] oversight of OTP accreditation bodies.  Model Guidelines for [employer] With input from [facility], the Federation of [employer]son in 2013 adopted a revised version of the federations office-based opioid treatment policies. The Model Policy on DATA 2000 and Treatment of Opioid Addiction in the Medical Office - 2013 (PDF  279 KB) provides model guidelines for use by state medical boards in regulating office-based opioid treatment.  Holiday Guidance for Opioid Treatment Programs (PDF  203 KB) In response to requests for the upcoming federal holidays and ensuing weekends (December 24th, 25th, and 26th and December 31st, Jan 1st, and Jan 2nd), this letter is to provide guidance  regarding requests for unsupervised doses of medication for patients for these dates. View a sample [id number] (PDF  194 KB).  Federal regulation of drugs emerged as early as 6, under a law that addressed only imported drugs. In 1905 the [employer] launched a private, voluntary means of controlling a substantial part of the drug marketplace, a system that remained in place for over a half-century. Drug regulation in FDA has evolved considerably since President Ricardo Para signed the 1906 Pure Food and Drugs Act.  1820 Eleven doctors set up the U.S. Pharmacopeia and record the first list of standard drugs. 1848 Drug Importation Act passed by Congress requires U.S. Customs Service inspection to stop entry of tainted, low quality drugs from overseas. 8116 Dr. Mitchell MICAEL Burrs becomes the chief chemist at the [facility] of [employer] adulteration studies.  1905 The American Medical Association [facility]) begins a voluntary program of drug approval that would last until 1955. In order to advertise in the [facility] and related journals, drug companies must show proof that the drug will treat what they claim. 1906 The original Food and Drug Act is passed by Congress on June 30 and signed by [employer].  The Act outlaws states from buying and selling food, drinks, and drugs that have been mislabeled and tainted. 1911 In U.S. v. Vicci, the [employer] that the [employer] and Drugs Act does not outlaw false medical claims but only false and misleading statements about the ingredients or identity of a drug. 1912 Congress passes the Romney Amendment to overcome the ruling in U.S. v. Vicci. The Act outlaws labeling medicines with fake medical claims that is meant to trick the buyer. 1930 The name of the Food, Drug, and Insecticide Administration is shortened to Food and Drug Administration (FDA) under an therapist, music. 1933 FDA  recommends a total rewrite of the out-of-date 1906 Food and Drugs Act.   1937 Elixir Sulfanilamide, contain the poisonous liquid, diethylene glycol, kills 107 persons, many of whom are children, dramatizing the need to establish drug safety before marketing and to pass the pending food and drug law. 1938 Congress passes [employer], Drug, and Cosmetic (FDC) Act of 1938, which requires that new drugs show safety before selling. This starts a new system of drug regulation. The Act also requires that safe limits be set for unavoidable poisonous matter and allows for factory inspections. The Directv is given power to oversee advertising for all FDAregulated products except prescription drugs. FDA states that sulfanilamide and other dangerous drugs must be given under the direction of a medical expert. This begins the requirement for prescription only (nonnarcotic) drugs (see 1951 Merced-Humphrey amendment). 1941 Nearly 300 deaths and injuries result from the use of sulfathiazole tablets, an antibiotic, tainted with the sedative, phenobarbital. In response, FDA drastically changes manufacturing and quality controls. These changes lead to the development of good manufacturing practices (GMPs). 1948 The [employer] in U.S. v. Floretta that FDA jurisdiction extends to retail stores, thereby allowing FDA to stop illegal sales of drugs by pharmacies including barbiturates and amphetamines. 1950 In Walgreen. v. U.S., a U.S. Court of Appeals rules that the directions for use on a drug label must include the drugs purpose. 1951 Congress passes the Hayes-Humphrey Amendment, which defines the kinds of drugs that cannot be used safely without medical supervision. The amendment limits sale of these drugs to prescription only by a medical professional. All other drugs are to be available without a prescription. 1952 A nationwide investigation by FDA  reveals that chloramphenicol, an antibiotic, caused nearly 180 cases of often deadly blood diseases. Two years later FDA engages the [employer] of Hospital Pharmacists, the American Association of Medical Record Librarians, and later the American Medical Association in a voluntary program of drug reaction reporting. 1953 The [employer] Amendment clarifies previous law and requires FDA to give manufacturers written reports of conditions seen during inspections and results of factory samples. 1962 Thalidomide, a new sleeping pill, causes severe birth defects of the arms and legs in thousands of babies born in [place]. The U.S. media reports on how Dr. Cathlean Mort, a FDA medical officer, helped prevent approval and marketing of Thalidomide in the [place] . These reports stirred up public support for stronger drug laws. 3 Congress passes the [employer]. For the first time, these laws require drug makers to prove their drug works before FDA can approve them for sale. The Advisory Committee on Investigational Drugs meets for the first time. This was the first meeting of a committee to advise FDA on product approval and policy on an ongoing basis. 1966 FDA contracts with the [facility]  of [employer] to measure the effectiveness of 4,000 marketed drugs approved on the basis of safety alone between [id number] and 12/31/1961. The Fair Packaging and Labeling Act requires all consumer products, in interstate commerce, to be honestly and informatively labeled. 01/01/68 FDA forms the Drug Efficacy Study Implementation (DESI) to carry out recommendations of the [employer] of the effectiveness of drugs first sold between [place] and 01-Jan-1963Jan 01, 1971 FDA requires the first patient package insert, medicines must come with information for the patient about risks and benefits. 1972 Over-the-Counter Drug Review begins  to enhance the safety, effectiveness and appropriate labeling of drugs sold without prescription. 1973 The U.S. Supreme Court upholds the [place] drug effectiveness law and approves FDAs action to control entire classes of products. 1982 FDA issues Tamper-resistant Packaging Regulations to prevent poisonings such as deaths from cyanide placed in Tylenol  capsules. Congress passes the [employer] in 1982/12/31, making it a crime to tamper with packaged consumer products. 1984/01/01 Drug Price Advertising Account Planner Act (Hatch-Waxman Act) increases the availability of less costly generic drugs by allowing FDA to approve applications for generic versions of brand-name drugs without repeating the research that proved the safety and effectiveness of the brand-name drugs. The Act also allowed brand-name companies to apply for up to five years additional patent protection for the new medicines they developed to make up for time lost while their products were going through FDA's approval process. 1989 The FDA issued guidelines asking drug makers to decide if a drug is likely to have usefulness in elderly people and to include elderly people in studies when applicable. 1991 In 12/31/80, the FDA and the Department of Health and Human Services published a policy on protecting people in research. In 12/31/90, this policy is adopted by more than a dozen federal agencies involved in human subject research and becomes known as the Common Rule. 4 1993 FDA launches MedWatch, a system designed to collect reports from health professionals on problems with drugs and other medical products. FDA issues guidelines for measuring gender differences in responses to medication. Drug companies are encouraged to include patients of both sexes in their research of drugs and to study any gender-specific effects. 1995 FDA declares cigarettes to be drug delivery devices. Limits are issued on marketing and  sales to reduce smoking by young people. 1998 FDA introduces the Adverse Event Reporting System (AERS), a computerized database designed to store and study safety reports on already marketed drugs.  The Demographic Rule requires that a marketing application review data on safety and effectiveness by age, gender, and race. The Pediatric Rule requires drug makers of selected new and existing drugs to conduct studies on drug safety and effectiveness in children. 1999 Creation of the Drug Facts Label for OTC drug products. The law requires all overthe-counter drug labels to have information in a standard format. These drug facts labels are designed to give the user easy-to-find information. 2000 The U. S. [employer], upholds an earlier decision from The [employer] and Drug Administration v. [employer]. et al. and rules 5-4 that FDA does not have authority to regulate tobacco as a drug. 12-31-01 The Best Pharmaceuticals for Children Act, in exchange for studying the drug in children, the drug maker gets six months of selling their product without competition. 12/31/2002 The Pediatric Research Equity Act gives FDA the right to ask drug companies to study the effectiveness of new drugs in children. 2004/01/01 FDA advises medical professionals to  limit the use of a pain reliever called Cox-2, a nonsteroidal anti-inflammatory drug (NSAIDs). Studies had shown that long-term use raised chances of heart attacks and strokes. The warning is also added to the over-thecounter NSAIDs Drug Facts label. Medicines used in hospitals must have a bar code to prevent patients from receiving the wrong medicine. 5 2005 The Drug Safety Board is formed, consisting of FDA staff and representatives from the [employer] of [address] and the [employer]. The Board advises the Director, Center for Drug Evaluation and Research, FDA, on drug safety issues and works with the agency in sharing safety  information to health professionals and patients.  The [place]  Food and Drug Administration (FDA) was first created to enforce the Pure Food and Drug Act of 1906. In this capacity, the FDA is charged with protecting the health of the [place]  public, to ensure the quality of its food, medicine, and cosmetics. Before this time, the [place]  government had no formal oversight of these products and left issues of quality and purity to the individual manufactures, or at times, individual states.    Review: [place] Stop ACT. (The Strengthen Opioid Misuse Prevention (STOP) Act of 2017). GENERAL ASSEMBLY OF [place]  SESSION 2017 SESSION LAW 2017-74 HOUSE BILL 243  PMP mandatory The dispenser shall report: (1) The dispenser's DEA number. (2) The name of the patient for whom the controlled substance is being dispensed, and the patient's: a. Full address, including city, state, and zip code, b. Telephone number, and c. Date of birth. (3) The date the prescription was written. (4) The date the prescription was filled. (5) The prescription number. (6) Whether the prescription is new or a refill. (7) Metric quantity of the dispensed drug. (8) Estimated days of supply of dispensed drug, if provided to the dispenser. (9) National Drug Code of dispensed drug. (10) Prescriber's DEA number. (11) Method of payment for the prescription.  No paper prescriptions  Duration of scripts Acute vs Chronic prescribing  2016 CDC Guidelines for prescribing Opioids for Chronic Pain. (Updated in 2022.) Medical Board  Laws:  Prescription Laws Drug laws, rules, and regulations are constantly changing. Any attempt to summarize them would quickly become outdated. Because of that, the Board encourages practitioners who seek guidance on prescribing procedures to refer to the sources listed below in addition to the Boards position statements, rules and Medical Practice Act.  [place]  Board of Pharmacy  (NCBOP) (which offers the states pharmacy laws and rules, and links to the Code of Federal Regulations) [employer] Site: www.ncbop.org  [place]  General Statutes General Web Site: politicalpool.cz See: [place]  Food, Drug, and Cosmetic Act: S293155 & 106-134 See: [place]  Pharmacy Practice Act, Article 4A: [phone] See: [place]  Controlled Substances Act, Article 5: 90-86 & 90-113.8 See: Use of controlled substances to render one mentally incapacitated or physically helpless: [facility]. Code, Title 21, Food & Drugs www.deadiversion.usdoj.gov Controlled Substances Schedules www.deadiversion.usdoj.gov Drug Warehouse Manager - www.deadiversion.usdoj.gov 42 CFR  8.12 - Federal opioid treatment standards.   Effective August 26, 2016, prior approval will be required for opioid analgesic doses for [facility]. Medicaid and [facility]) beneficiaries which:  Exceed 120 mg of morphine equivalents (MME) per day  Are greater than a 14-day supply of any opioid, or,  Are non-preferred opioid products on the [place] Medicaid Preferred Drug List (PDL)  FEDERAL 42 CFR  8.12 - Federal opioid treatment standards. Title II of the Comprehensive Drug Abuse  Prevention and Control Act of 1970, commonly known as the Controlled Substance Act (CSA) Title 21 [place]  Code ([place]) Controlled Substances Act.   Reference:   ______________________________________________________________________       ______________________________________________________________________    Medication Rules  Purpose: To inform patients, and their family members, of our medication rules and regulations.  Applies to: All patients receiving prescriptions from our practice (written or electronic).  Pharmacy of record: This is the pharmacy where your electronic prescriptions will be sent. Make sure we have the correct one.  Electronic  prescriptions: In compliance with the [place]  Strengthen Opioid Misuse Prevention (STOP) Act of 2017 (Session Law 2017-74/H243), effective December 31, 2018, all controlled substances must be electronically prescribed. Written prescriptions, faxing, or calling prescriptions to a pharmacy will no longer be done.  Prescription refills: These will be provided only during in-person appointments. No medications will be renewed without a face-to-face evaluation with your provider. Applies to all prescriptions.  NOTE: The following applies primarily to controlled substances (Opioid* Pain Medications).   Type of encounter (visit): For patients receiving controlled substances, face-to-face visits are required. (Not an option and not up to the patient.)  Patient's Responsibilities: Pain Pills: Bring all pain pills to every appointment (except for procedure appointments). Pill counts are required.  Pill Bottles: Bring pills in original pharmacy bottle. Bring bottle, even if empty. Always bring the bottle of the most recent fill.  Medication refills: You are responsible for knowing and keeping track of what medications you are taking and when is it that you will need a refill. The day before your appointment: write a list of all prescriptions that need to be refilled. The day of the appointment: give the list to the admitting nurse. Prescriptions will be written only during appointments. No prescriptions will be written on procedure days. If you forget a medication: it will not be Called in, Faxed, or electronically sent. You will need to get another appointment to get these prescribed. No early refills. Do not call asking to have your prescription filled early. Partial  or short prescriptions: Occasionally your pharmacy may not have enough pills to fill your prescription.  NEVER ACCEPT a partial fill or a prescription that is short of the total amount of pills that you were prescribed.  With  controlled substances the law allows 72 hours for the pharmacy to complete the prescription.  If the prescription is not completed within 72 hours, the pharmacist will require a new prescription to be written. This means that you will be short on your medicine and we WILL NOT send another prescription to complete your original prescription.  Instead, request the pharmacy to send a carrier to a nearby branch to get enough medication to provide you with your full prescription. Prescription Accuracy: You are responsible for carefully inspecting your prescriptions before leaving our office. Have the discharge nurse carefully go over each prescription with you, before taking them home. Make sure that your name is accurately spelled, that your address is correct. Check the name and dose of your medication to make sure it is accurate. Check the number of pills, and the written instructions to make sure they are clear and accurate. Make sure that you are given enough medication to last until your next medication refill appointment. Taking Medication: Take medication as prescribed. When it comes to controlled substances, taking less pills or less frequently than prescribed is permitted and encouraged. Never take more pills than instructed. Never take the medication more frequently than  prescribed.  Inform other Doctors: Always inform, all of your healthcare providers, of all the medications you take. Pain Medication from other Providers: You are not allowed to accept any additional pain medication from any other Doctor or Healthcare provider. There are two exceptions to this rule. (see below) In the event that you require additional pain medication, you are responsible for notifying us , as stated below. Cough Medicine: Often these contain an opioid, such as codeine or hydrocodone . Never accept or take cough medicine containing these opioids if you are already taking an opioid* medication. The combination may cause  respiratory failure and death. Medication Agreement: You are responsible for carefully reading and following our Medication Agreement. This must be signed before receiving any prescriptions from our practice. Safely store a copy of your signed Agreement. Violations to the Agreement will result in no further prescriptions. (Additional copies of our Medication Agreement are available upon request.) Laws, Rules, & Regulations: All patients are expected to follow all [address] and [employer], Rules, [place] [employer]. Ignorance of the Laws does not constitute a valid excuse.  Illegal drugs and Controlled Substances: The use of illegal substances (including, but not limited to marijuana and its derivatives) and/or the illegal use of any controlled substances is strictly prohibited. Violation of this rule may result in the immediate and permanent discontinuation of any and all prescriptions being written by our practice. The use of any illegal substances is prohibited. Adopted CDC guidelines & recommendations: Target dosing levels will be at or below 60 MME/day. Use of benzodiazepines** is not recommended. Urine Drug testing: Patients taking controlled substances will be required to provide a urine sample upon request. Do not void before coming to your medication management appointments. Hold emptying your bladder until you are admitted. The admitting nurse will inform you if a sample is required. Our practice reserves the right to call you at any time to provide a sample. Once receiving the call, you have 24 hours to comply with request. Not providing a sample upon request may result in termination of medication therapy.  Exceptions: There are only two exceptions to the rule of not receiving pain medications from other Healthcare Providers. Exception #1 (Emergencies): In the event of an emergency (i.e.: accident requiring emergency care), you are allowed to receive additional pain medication. However, you are  responsible for: As soon as you are able, call our office [phone], at any time of the day or night, and leave a message stating your name, the date and nature of the emergency, and the name and dose of the medication prescribed. In the event that your call is answered by a member of our staff, make sure to document and save the date, time, and the name of the person that took your information.  Exception #2 (Planned Surgery): In the event that you are scheduled by another doctor or dentist to have any type of surgery or procedure, you are allowed (for a period no longer than 30 days), to receive additional pain medication, for the acute post-op pain. However, in this case, you are responsible for picking up a copy of our Post-op Pain Management for Surgeons handout, and giving it to your surgeon or dentist. This document is available at our office, and does not require an appointment to obtain it. Simply go to our office during business hours (Monday-Thursday from 8:00 AM to 4:00 PM) (Friday 8:00 AM to 12:00 Noon) or if you have a scheduled appointment with us , prior to your surgery,  and ask for it by name. In addition, you are responsible for: calling our office [phone], at any time of the day or night, and leaving a message stating your name, name of your surgeon, type of surgery, and date of procedure or surgery. Failure to comply with your responsibilities may result in termination of therapy involving the controlled substances.  Consequences:  Non-compliance with the above rules may result in permanent discontinuation of medication prescription therapy. All patients receiving any type of controlled substance is expected to comply with the above patient responsibilities. Not doing so may result in permanent discontinuation of medication prescription therapy. Medication Agreement Violation. Following the above rules, including your responsibilities will help you in avoiding a Medication  Agreement Violation (Breaking your Pain Medication Contract).  *Opioid medications include: morphine, codeine, oxycodone , oxymorphone, hydrocodone , hydromorphone, meperidine, tramadol, tapentadol, buprenorphine, fentanyl , methadone. **Benzodiazepine medications include: diazepam (Valium), alprazolam  (Xanax ), clonazepam (Klonopine), lorazepam (Ativan), clorazepate (Tranxene), chlordiazepoxide (Librium), estazolam (Prosom), oxazepam (Serax), temazepam (Restoril), triazolam (Halcion) (Last updated: 10/23/2023) ______________________________________________________________________     ______________________________________________________________________    Medication Recommendations and Reminders  Applies to: All patients receiving prescriptions (written and/or electronic).  Medication Rules & Regulations: You are responsible for reading, knowing, and following our Medication Rules document. These exist for your safety and that of others. They are not flexible and neither are we. Dismissing or ignoring them is an act of non-compliance that may result in complete and irreversible termination of such medication therapy. For safety reasons, non-compliance will not be tolerated. As with the U.S[place] fundamental legal principle of ignorance of the law is no defense, we will accept no excuses for not having read and knowing the content of documents provided to you by our practice.  Pharmacy of record:  Definition: This is the pharmacy where your electronic prescriptions will be sent.  We do not endorse any particular pharmacy. It is up to you and your insurance to decide what pharmacy to use.  We do not restrict you in your choice of pharmacy. However, once we write for your prescriptions, we will NOT be re-sending more prescriptions to fix restricted supply problems created by your pharmacy, or your insurance.  The pharmacy listed in the electronic medical record should be the one where you want  electronic prescriptions to be sent. If you choose to change pharmacy, simply notify our nursing staff. Changes will be made only during your regular appointments and not over the phone.  Recommendations: Keep all of your pain medications in a safe place, under lock and key, even if you live alone. We will NOT replace lost, stolen, or damaged medication. We do not accept Police Reports as proof of medications having been stolen. After you fill your prescription, take 1 week's worth of pills and put them away in a safe place. You should keep a separate, properly labeled bottle for this purpose. The remainder should be kept in the original bottle. Use this as your primary supply, until it runs out. Once it's gone, then you know that you have 1 week's worth of medicine, and it is time to come in for a prescription refill. If you do this correctly, it is unlikely that you will ever run out of medicine. To make sure that the above recommendation works, it is very important that you make sure your medication refill appointments are scheduled at least 1 week before you run out of medicine. To do this in an effective manner, make sure that you do not leave the office  without scheduling your next medication management appointment. Always ask the nursing staff to show you in your prescription , when your medication will be running out. Then arrange for the receptionist to get you a return appointment, at least 7 days before you run out of medicine. Do not wait until you have 1 or 2 pills left, to come in. This is very poor planning and does not take into consideration that we may need to cancel appointments due to bad weather, sickness, or emergencies affecting our staff. DO NOT ACCEPT A Partial Fill: If for any reason your pharmacy does not have enough pills/tablets to completely fill or refill your prescription, do not allow for a partial fill. The law allows the pharmacy to complete that prescription within 72  hours, without requiring a new prescription. If they do not fill the rest of your prescription within those 72 hours, you will need a separate prescription to fill the remaining amount, which we will NOT provide. If the reason for the partial fill is your insurance, you will need to talk to the pharmacist about payment alternatives for the remaining tablets, but again, DO NOT ACCEPT A PARTIAL FILL, unless you can trust your pharmacist to obtain the remainder of the pills within 72 hours.  Prescription refills and/or changes in medication(s):  Prescription refills, and/or changes in dose or medication, will be conducted only during scheduled medication management appointments. (Applies to both, written and electronic prescriptions.) No refills on procedure days. No medication will be changed or started on procedure days. No changes, adjustments, and/or refills will be conducted on a procedure day. Doing so will interfere with the diagnostic portion of the procedure. No phone refills. No medications will be called into the pharmacy. No Fax refills. No weekend refills. No Holliday refills. No after hours refills.  Remember:  Business hours are:  Monday to Thursday 8:00 AM to 4:00 PM Provider's Schedule: Eric Como, MD - Appointments are:  Medication management: Monday and Wednesday 8:00 AM to 4:00 PM Procedure day: Tuesday and Thursday 7:30 AM to 4:00 PM Wallie Sherry, MD - Appointments are:  Medication management: Tuesday and Thursday 8:00 AM to 4:00 PM Procedure day: Monday and Wednesday 7:30 AM to 4:00 PM (Last update: 10/23/2022) ______________________________________________________________________     ______________________________________________________________________    National Pain Medication Shortage  The [place] is experiencing worsening drug shortages. These have had a negative widespread effect on patient care and treatment. Not expected to improve any time soon.  Predicted to last past 2029.   Drug shortage list (generic names) Oxycodone  IR Oxycodone /APAP Oxymorphone IR Hydromorphone Hydrocodone /APAP Morphine  Where is the problem?  Manufacturing and supply level.  Will this shortage affect you?  Only if you take any of the above pain medications.  How? You may be unable to fill your prescription.  Your pharmacist may offer a partial fill of your prescription. (Warning: Do not accept partial fills.) Prescriptions partially filled cannot be transferred to another pharmacy. Read our Medication Rules and Regulation. Depending on how much medicine you are dependent on, you may experience withdrawals when unable to get the medication.  Recommendations: Consider ending your dependence on opioid pain medications. Ask your pain specialist to assist you with the process. Consider switching to a medication currently not in shortage, such as Buprenorphine. Talk to your pain specialist about this option. Consider decreasing your pain medication requirements by managing tolerance thru Drug Holidays. This may help minimize withdrawals, should you run out of medicine. Control your pain  thru the use of non-pharmacological interventional therapies.   Your prescriber: Prescribers cannot be blamed for shortages. Medication manufacturing and supply issues cannot be fixed by the prescriber.   NOTE: The prescriber is not responsible for supplying the medication, or solving supply issues. Work with your pharmacist to solve it. The patient is responsible for the decision to take or continue taking the medication and for identifying and securing a legal supply source. By law, supplying the medication is the job and responsibility of the pharmacy. The prescriber is responsible for the evaluation, monitoring, and prescribing of these medications.   Prescribers will NOT: Re-issue prescriptions that have been partially filled. Re-issue prescriptions already sent to  a pharmacy.  Re-send prescriptions to a different pharmacy because yours did not have your medication. Ask pharmacist to order more medicine or transfer the prescription to another pharmacy. (Read below.)  New 2023 regulation: August 31, 2022 Revised Regulation Allows DEA-Registered Pharmacies to Transfer Electronic Prescriptions at a Patients Request DEA Headquarters Division - Public Information Office Patients now have the ability to request their electronic prescription be transferred to another pharmacy without having to go back to their practitioner to initiate the request. This revised regulation went into effect on Monday, August 27, 2022.     At a patients request, a DEA-registered retail pharmacy can now transfer an electronic prescription for a controlled substance (schedules II-V) to another DEA-registered retail pharmacy. Prior to this change, patients would have to go through their practitioner to cancel their prescription and have it re-issued to a different pharmacy. The process was taxing and time consuming for both patients and practitioners.    The Drug Enforcement Administration [facility]) published its intent to revise the process for transferring electronic prescriptions on November 18, 2020.  The final rule was published in the federal register on July 26, 2022 and went into effect 30 days later.  Under the final rule, a prescription can only be transferred once between pharmacies, and only if allowed under existing state or other applicable law. The prescription must remain in its electronic form; may not be altered in any way; and the transfer must be communicated directly between two licensed pharmacists. Its important to note, any authorized refills transfer with the original prescription, which means the entire prescription will be filled at the same pharmacy.   Reference: hugehand.is [facility] website announcement)  Cheapwipes.at.pdf [employer])   [employer] / Vol. 88, No. 143 / Thursday, July 26, 2022 / Rules and Regulations DEPARTMENT OF JUSTICE  Drug Enforcement Administration  21 CFR Part 1306  [Docket No. [id number]]  RIN [id number] Transfer of Electronic Prescriptions for Schedules II-V Controlled Substances Between Pharmacies for Initial Filling  ______________________________________________________________________       ______________________________________________________________________    Transfer of Pain Medication between Pharmacies  Re: 2023 DEA Clarification on existing regulation  Published on DEA Website: August 31, 2022  Title: Revised Regulation Allows DEA-Registered Pharmacies to Electrical Engineer Prescriptions at a Patients Request DEA Headquarters Division - [employer]  Patients now have the ability to request their electronic prescription be transferred to another pharmacy without having to go back to their practitioner to initiate the request. This revised regulation went into effect on Monday, August 27, 2022.     At a patients request, a DEA-registered retail pharmacy can now transfer an electronic prescription for a controlled substance (schedules II-V) to another DEA-registered retail pharmacy. Prior to this change, patients would have to go through their practitioner  to cancel their prescription and have it re-issued to a different pharmacy. The process was taxing and time consuming for both patients and practitioners.    The Drug Enforcement Administration [facility]) published its intent to revise the process for transferring electronic prescriptions on November 18, 2020.  The final rule was published in the  federal register on July 26, 2022 and went into effect 30 days later.  Under the final rule, a prescription can only be transferred once between pharmacies, and only if allowed under existing state or other applicable law. The prescription must remain in its electronic form; may not be altered in any way; and the transfer must be communicated directly between two licensed pharmacists. Its important to note, any authorized refills transfer with the original prescription, which means the entire prescription will be filled at the same pharmacy.    REFERENCES: 1. DEA website announcement hugehand.is  2. Department of Justice website  Cheapwipes.at.pdf  3. DEPARTMENT OF JUSTICE Drug Enforcement Administration 21 CFR Part 1306 [Docket No. [id number]] RIN [id number] Transfer of Electronic Prescriptions for Schedules II-V Controlled Substances Between Pharmacies for Initial Filling  ______________________________________________________________________       ______________________________________________________________________    Medication Transfer   Notification You are currently compliant and stable on your pain medication regimen. This regimen will be transferred today to your Primary Care Provider (PCP). You will be provided with enough prescriptions to last for 90 days. After that, your prescriptions will need to be taken over by your PCP.  Recommendation Immediately contact your primary care provider to secure an appointment for evaluation before this period is over. Do not wait until the last month to contact them.   Clarification The transfer of your medication regimen does not mean that you are being discharged from our clinic. We will remain available to you for any consultation or interventional therapies you may need.    Alternative Should you decide not to continue taking these medication and would like assistance in permanently stopping them, please let us  know so that we can design a slow tapering down of your regimen.  Reason Our primary responsibility to provide specialized interventional pain management therapies otherwise not available to the community. We have in the past assisted primary care providers with reviewing and adjusting pain medication management therapies, however, we have been transparent to all patients and referring providers that it is not our intention to permanently take over this type of therapy. Transfer of this portion of your care will assist us  in freeing time to assist others in need of our specialty services.   ______________________________________________________________________      ______________________________________________________________________    WARNING: CBD (cannabidiol) & Delta (Delta-8 tetrahydrocannabinol) products.   Applicable to:  All individuals currently taking or considering taking CBD (cannabidiol) and, more important, all patients taking opioid analgesic controlled substances (pain medication). (Example: oxycodone ; oxymorphone; hydrocodone ; hydromorphone; morphine; methadone; tramadol; tapentadol; fentanyl ; buprenorphine; butorphanol; dextromethorphan; meperidine; codeine; etc.)  Introduction:  Recently there has been a drive towards the use of natural products for the treatment of different conditions, including pain anxiety and sleep disorders. Marijuana and hemp are two varieties of the cannabis genus plants. Marijuana and its derivatives are illegal, while hemp and its derivatives are not. Cannabidiol (CBD) and tetrahydrocannabinol (THC), are two natural compounds found in plants of the Cannabis genus. They can both be extracted from hemp or marijuana. Both compounds interact with your bodys endocannabinoid system in very different ways. CBD is  associated with pain relief (analgesia) while THC  is associated with the psychoactive effects (the high) obtained from the use of marijuana products. There are two main types of THC: Delta-9, which comes from the marijuana plant and it is illegal, and Delta-8, which comes from the hemp plant, and it is legal. (Both, Delta-9-THC and Delta-8-THC are psychoactive and give you the high.)   Legality:  Marijuana and its derivatives: illegal Hemp and its derivatives: Legal (State dependent) UPDATE: (02/16/2022) The Drug Enforcement Agency (DEA) issued a letter stating that delta cannabinoids, including Delta-8-THCO and Delta-9-THCO, synthetically derived from hemp do not qualify as hemp and will be viewed as Schedule I drugs. (Schedule I drugs, substances, or chemicals are defined as drugs with no currently accepted medical use and a high potential for abuse. Some examples of Schedule I drugs are: heroin, lysergic acid diethylamide (LSD), marijuana (cannabis), 3,4-methylenedioxymethamphetamine (ecstasy), methaqualone, and peyote.) (cuetune.com.ee)  Legal status of CBD in [place]:  Conditionally Legal  Reference: FDA Regulation of Cannabis and Cannabis-Derived Products, Including Cannabidiol (CBD) - oemdeals.dk  Warning:  CBD is not FDA approved and has not undergo the same manufacturing controls as prescription drugs.  This means that the purity and safety of available CBD may be questionable. Most of the time, despite manufacturer's claims, it is contaminated with THC (delta-9-tetrahydrocannabinol - the chemical in marijuana responsible for the HIGH).  When this is the case, the [facility] contaminant will trigger a positive urine drug screen (UDS) test for Marijuana (carboxy-THC).   The FDA recently put out a warning about 5 things that everyone should be aware of regarding Delta-8  THC: Delta-8 THC products have not been evaluated or approved by the FDA for safe use and may be marketed in ways that put the public health at risk. The FDA has received adverse event reports involving delta-8 THC-containing products. Delta-8 THC has psychoactive and intoxicating effects. Delta-8 THC manufacturing often involve use of potentially harmful chemicals to create the concentrations of delta-8 THC claimed in the marketplace. The final delta-8 THC product may have potentially harmful by-products (contaminants) due to the chemicals used in the process. Manufacturing of delta-8 THC products may occur in uncontrolled or unsanitary settings, which may lead to the presence of unsafe contaminants or other potentially harmful substances. Delta-8 THC products should be kept out of the reach of children and pets.  NOTE: Because a positive UDS for any illicit substance is a violation of our medication agreement, your opioid analgesics (pain medicine) may be permanently discontinued.  MORE ABOUT CBD  General Information: CBD was discovered in [birth year] and it is a derivative of the cannabis sativa genus plants (Marijuana and Hemp). It is one of the 113 identified substances found in Marijuana. It accounts for up to 40% of the plant's extract. As of 2018, preliminary clinical studies on CBD included research for the treatment of anxiety, movement disorders, and pain. CBD is available and consumed in multiple forms, including inhalation of smoke or vapor, as an aerosol spray, and by mouth. It may be supplied as an oil containing CBD, capsules, dried cannabis, or as a liquid solution. CBD is thought not to be as psychoactive as THC (delta-9-tetrahydrocannabinol - the chemical in marijuana responsible for the HIGH). Studies suggest that CBD may interact with different biological target receptors in the body, including cannabinoid and other neurotransmitter receptors. As of 2018 the mechanism of action for its  biological effects has not been determined.  Side-effects  Adverse reactions: Dry mouth, diarrhea, decreased appetite, fatigue, drowsiness, malaise, weakness, sleep disturbances, and  others.  Drug interactions:  CBD may interact with medications such as blood-thinners. CBD causes drowsiness on its own and it will increase drowsiness caused by other medications, including antihistamines (such as Benadryl), benzodiazepines (Xanax , Ativan, Valium), antipsychotics, antidepressants, opioids, alcohol  and supplements such as kava, melatonin and St. John's Wort.  Other drug interactions: Brivaracetam (Briviact); Caffeine; Carbamazepine (Tegretol); Citalopram (Celexa); Clobazam (Onfi); Eslicarbazepine (Aptiom); Everolimus (Zostress); Lithium; Methadone (Dolophine); Rufinamide (Banzel); Sedative medications (CNS depressants); Sirolimus (Rapamune); Stiripentol (Diacomit); Tacrolimus (Prograf); Tamoxifen ; Soltamox); Topiramate (Topamax); Valproate; Warfarin (Coumadin ); Zonisamide. (Last update: 12/10/2022) ______________________________________________________________________     ______________________________________________________________________    Muscle Spasms & Cramps  Cause(s):  Most common - vitamin and/or electrolyte (calcium , potassium, sodium, etc.) deficiencies. Post procedure - steroids (injected, oral, or inhaled) can make your kidneys excrete (loose) electrolytes. Most of the time this will not cause any symptoms however, if you happen to be borderline low on your electrolytes, it may temporarily triggering cramps & spasms.  Possible triggers: Sweating - causes loss of electrolytes thru the skin. Steroids - causes loss of electrolytes thru the urine.  Treatment: (over-the-counter)  Gatorade (or any other electrolyte-replenishing drink) - Take 1, 8 oz glass with each meal (3 times a day). Mechanism of action: Replenishes lost electrolytes. Magnesium  400 to 500 mg - Take 1 tablet twice a  day (one with breakfast and one at bedtime). If you have kidney disease talk to your primary care physician before taking any Magnesium . Mechanism of action: Magnesium  is a natural muscle relaxant. Tonic Water with quinine - Take 1, 8 oz glass before bedtime.  Mechanism of action: Quinine is used to treat spasms.  Last Update: 07/11/2023  ______________________________________________________________________     ______________________________________________________________________    Appointment Information  It is our goal and responsibility to provide the medical community with assistance in the evaluation and management of patients with chronic pain. Unfortunately our resources are limited. Because we do not have an unlimited amount of time, or available appointments, we are required to closely monitor for unkept or cancelled appointments.  Patient's responsibilities: 1. Punctuality: Patients are required to be physically present in our office at least 15 minutes before their scheduled appointment. 2. Tardiness: Patients not physically present in our office at their scheduled appointment time will be rescheduled. 3. Plan ahead: Assume that you will encounter traffic and plan to arrive 30 minutes before your appointment. 4. Other appointments and responsibilities: Do not schedule other appointments immediately before or after your scheduled appointment.  5. Be prepared: Make a list of everything that you need to discuss with your provider so that you use your time efficiently. Once the provider leaves your room, he/she will not return to your room to discuss anything that you neglected to bring up during your allowed time. 6. No children or pets: Do not bring children or pets to your appointment. 7. Cancelling or rescheduling your appointment: Advanced notification (more than 24 hours in advance) is required. 8. No Show: Not calling to cancel an appointment and simply not showing up is  unacceptable. This leads to loss of appointments that could have been used by a patient in need. (See below)  Corrective process for repeat offenders:  No Shows: Three (3) No Shows within a 12 month period will result in an automatic discharge from our practice. Rescheduling or cancelling with more than 24 hours notice will not be penalized and will not count against you. Tardiness: If you have to be rescheduled three (3) times due to late arrivals,  it will be counted as one (1) No Show. Cancellation or reschedule: Three (3) cancellations or rescheduling where notice was given with less than 24 hours in advance, will be recorded as one (1) No Show.  Types of appointments: New patient initial evaluation: These are evaluations only. Your initial patient questionnaire will be collected and entered into the system. A history of present illness will be taken. Prior lab work, imaging studies, and associated treatments will be reviewed. The provider may order appropriate diagnostic testing depending on their evaluation and review of available information. No treatments will be started on this visit. 2nd Follow-up visit: During this visit your provider will inform you of the results of the diagnostic tests ordered on the initial evaluation. Based on the providers assessment, treatment options will be offered, at which the patient will decide if he/she is interested in the alternatives. If interested, a treatment plan will be established and started. Procedure visits: Post-procedure evaluation visits: Evaluation visits MM New problems Flare-up evaluations Follow-up after diagnostic testing ______________________________________________________________________

## 2024-12-29 ENCOUNTER — Other Ambulatory Visit: Payer: Self-pay | Admitting: Orthopedic Surgery

## 2024-12-30 ENCOUNTER — Encounter: Admitting: Nurse Practitioner

## 2025-01-08 ENCOUNTER — Encounter
Admission: RE | Admit: 2025-01-08 | Discharge: 2025-01-08 | Disposition: A | Source: Ambulatory Visit | Attending: Orthopedic Surgery

## 2025-01-08 ENCOUNTER — Other Ambulatory Visit: Payer: Self-pay

## 2025-01-08 VITALS — BP 119/59 | HR 61 | Resp 16 | Wt 156.7 lb

## 2025-01-08 DIAGNOSIS — M25562 Pain in left knee: Secondary | ICD-10-CM

## 2025-01-08 DIAGNOSIS — Z01818 Encounter for other preprocedural examination: Secondary | ICD-10-CM | POA: Insufficient documentation

## 2025-01-08 DIAGNOSIS — Z01810 Encounter for preprocedural cardiovascular examination: Secondary | ICD-10-CM | POA: Diagnosis not present

## 2025-01-08 DIAGNOSIS — R001 Bradycardia, unspecified: Secondary | ICD-10-CM | POA: Insufficient documentation

## 2025-01-08 DIAGNOSIS — Z01812 Encounter for preprocedural laboratory examination: Secondary | ICD-10-CM

## 2025-01-08 HISTORY — DX: Solitary pulmonary nodule: R91.1

## 2025-01-08 HISTORY — DX: Unilateral primary osteoarthritis, left knee: M17.12

## 2025-01-08 HISTORY — DX: Chronic obstructive pulmonary disease, unspecified: J44.9

## 2025-01-08 HISTORY — DX: Nicotine dependence, unspecified, uncomplicated: F17.200

## 2025-01-08 LAB — SURGICAL PCR SCREEN
MRSA, PCR: NEGATIVE
Staphylococcus aureus: POSITIVE — AB

## 2025-01-08 NOTE — Patient Instructions (Addendum)
 Your procedure is scheduled on: 01/18/25 - Monday Report to the Registration Desk on the 1st floor of the [facility]. To find out your arrival time, please call [phone] between 1PM - 3PM on: 01/15/25 - Friday If your arrival time is 6:00 am, do not arrive before that time as the [facility] entrance doors do not open until 6:00 am.  REMEMBER: Instructions that are not followed completely may result in serious medical risk, up to and including death; or upon the discretion of your surgeon and anesthesiologist your surgery may need to be rescheduled.  Do not eat food after midnight the night before surgery.  No gum chewing or hard candies.  You may however, drink CLEAR liquids up to 2 hours before you are scheduled to arrive for your surgery. Do not drink anything within 2 hours of your scheduled arrival time.  Clear liquids include: - water  - apple juice without pulp - gatorade (not RED colors) - black coffee or tea (Do NOT add milk or creamers to the coffee or tea) Do NOT drink anything that is not on this list.  In addition, your doctor has ordered for you to drink the provided:  Ensure Pre-Surgery Clear Carbohydrate Drink  Drinking this carbohydrate drink up to two hours before surgery helps to reduce insulin resistance and improve patient outcomes. Please complete drinking 2 hours before scheduled arrival time.  One week prior to surgery: Stop beginning 01/12, Anti-inflammatories (NSAIDS) such as Advil, Aleve, Ibuprofen, Motrin, Naproxen, Naprosyn and Aspirin based products such as Excedrin, Goody's Powder, BC Powder. You may take Tylenol  and your [facility] if needed for pain up until the day of surgery.  Stop ANY OVER THE COUNTER supplements until after surgery - Multivitamin  ON THE DAY OF SURGERY ONLY TAKE THESE MEDICATIONS WITH SIPS OF WATER:  budesonide -formoterol  (SYMBICORT )  HYDROcodone -acetaminophen  if needed LYRICA  pantoprazole (PROTONIX)   Use inhalers on the  day of surgery and bring to the hospital.   No Alcohol for 24 hours before or after surgery.  No Smoking including e-cigarettes for 24 hours before surgery.  No chewable tobacco products for at least 6 hours before surgery.  No nicotine  patches on the day of surgery.  Do not use any recreational drugs for at least a week (preferably 2 weeks) before your surgery.  Please be advised that the combination of cocaine and anesthesia may have negative outcomes, up to and including death. If you test positive for cocaine, your surgery will be cancelled.  On the morning of surgery brush your teeth with toothpaste and water, you may rinse your mouth with mouthwash if you wish. Do not swallow any toothpaste or mouthwash.  Use CHG Soap or wipes as directed on instruction sheet.  Do not wear jewelry, make-up, hairpins, clips or nail polish.  For welded (permanent) jewelry: bracelets, anklets, waist bands, etc.  Please have this removed prior to surgery.  If it is not removed, there is a chance that hospital personnel will need to cut it off on the day of surgery.  Do not wear lotions, powders, or perfumes.   Do not shave body hair from the neck down 48 hours before surgery.  Contact lenses, hearing aids and dentures may not be worn into surgery.  Do not bring valuables to the hospital. [facility] is not responsible for any missing/lost belongings or valuables.   Bring your C-PAP to the hospital in case you may have to spend the night.   Notify your  doctor if there is any change in your medical condition (cold, fever, infection).  Wear comfortable clothing (specific to your surgery type) to the hospital.  After surgery, you can help prevent lung complications by doing breathing exercises.  Take deep breaths and cough every 1-2 hours. Your doctor may order a device called an Incentive Spirometer to help you take deep breaths.  If you are being admitted to the hospital overnight, leave your  suitcase in the car. After surgery it may be brought to your room.  In case of increased patient census, it may be necessary for you, the patient, to continue your postoperative care in the Same Day Surgery department.  If you are being discharged the day of surgery, you will not be allowed to drive home. You will need a responsible individual to drive you home and stay with you for 24 hours after surgery.   If you are taking public transportation, you will need to have a responsible individual with you.  Please call the Pre-admissions Testing Dept. at [phone] if you have any questions about these instructions.  Surgery Visitation Policy:  Patients having surgery or a procedure may have two visitors.  Children under the age of 77 must have an adult with them who is not the patient.  Inpatient Visitation:    Visiting hours are 7 a.m. to 8 p.m. Up to four visitors are allowed at one time in a patient room. The visitors may rotate out with other people during the day.  One visitor age 18 or older may stay with the patient overnight and must be in the room by 8 p.m.   Merchandiser, Retail to address health-related social needs:  [email or web address]    Pre-operative 4 CHG Bath Instructions   You can play a key role in reducing the risk of infection after surgery. Your skin needs to be as free of germs as possible. You can reduce the number of germs on your skin by washing with CHG (chlorhexidine  gluconate) soap before surgery. CHG is an antiseptic soap that kills germs and continues to kill germs even after washing.   DO NOT use if you have an allergy to chlorhexidine /CHG or antibacterial soaps. If your skin becomes reddened or irritated, stop using the CHG and notify one of our RNs at [phone].   Please shower with the CHG soap starting 4 days before surgery using the following schedule: 01/15 - 01/19.    Please keep in mind the following:  DO NOT  shave, including legs and underarms, starting the day of your first shower.   You may shave your face at any point before/day of surgery.  Place clean sheets on your bed the day you start using CHG soap. Use a clean washcloth (not used since being washed) for each shower. DO NOT sleep with pets once you start using the CHG.   CHG Shower Instructions:  If you choose to wash your hair and private area, wash first with your normal shampoo/soap.  After you use shampoo/soap, rinse your hair and body thoroughly to remove shampoo/soap residue.  Turn the water OFF and apply about 3 tablespoons (45 ml) of CHG soap to a CLEAN washcloth.  Apply CHG soap ONLY FROM YOUR NECK DOWN TO YOUR TOES (washing for 3-5 minutes)  DO NOT use CHG soap on face, private areas, open wounds, or sores.  Pay special attention to the area where your surgery is being performed.  If you are having back surgery, having someone  wash your back for you may be helpful. Wait 2 minutes after CHG soap is applied, then you may rinse off the CHG soap.  Pat dry with a clean towel  Put on clean clothes/pajamas   If you choose to wear lotion, please use ONLY the CHG-compatible lotions on the back of this paper.     Additional instructions for the day of surgery: DO NOT APPLY any lotions, deodorants, cologne, or perfumes.   Put on clean/comfortable clothes.  Brush your teeth.  Ask your nurse before applying any prescription medications to the skin.      CHG Compatible Lotions   Aveeno Moisturizing lotion  Cetaphil Moisturizing Cream  Cetaphil Moisturizing Lotion  Clairol Herbal Essence Moisturizing Lotion, Dry Skin  Clairol Herbal Essence Moisturizing Lotion, Extra Dry Skin  Clairol Herbal Essence Moisturizing Lotion, Normal Skin  Curel Age Defying Therapeutic Moisturizing Lotion with Alpha Hydroxy  Curel Extreme Care Body Lotion  Curel Soothing Hands Moisturizing Hand Lotion  Curel Therapeutic Moisturizing Cream,  Fragrance-Free  Curel Therapeutic Moisturizing Lotion, Fragrance-Free  Curel Therapeutic Moisturizing Lotion, Original Formula  Eucerin Daily Replenishing Lotion  Eucerin Dry Skin Therapy Plus Alpha Hydroxy Crme  Eucerin Dry Skin Therapy Plus Alpha Hydroxy Lotion  Eucerin Original Crme  Eucerin Original Lotion  Eucerin Plus Crme Eucerin Plus Lotion  Eucerin TriLipid Replenishing Lotion  Keri Anti-Bacterial Hand Lotion  Keri Deep Conditioning Original Lotion Dry Skin Formula Softly Scented  Keri Deep Conditioning Original Lotion, Fragrance Free Sensitive Skin Formula  Keri Lotion Fast Absorbing Fragrance Free Sensitive Skin Formula  Keri Lotion Fast Absorbing Softly Scented Dry Skin Formula  Keri Original Lotion  Keri Skin Renewal Lotion Keri Silky Smooth Lotion  Keri Silky Smooth Sensitive Skin Lotion  Nivea Body Creamy Conditioning Oil  Nivea Body Extra Enriched Lotion  Nivea Body Original Lotion  Nivea Body Sheer Moisturizing Lotion Nivea Crme  Nivea Skin Firming Lotion  NutraDerm 30 Skin Lotion  NutraDerm Skin Lotion  NutraDerm Therapeutic Skin Cream  NutraDerm Therapeutic Skin Lotion  ProShield Protective Hand Cream  Provon moisturizing lotion   How to Use an Incentive Spirometer  An incentive spirometer is a tool that measures how well you are filling your lungs with each breath. Learning to take long, deep breaths using this tool can help you keep your lungs clear and active. This may help to reverse or lessen your chance of developing breathing (pulmonary) problems, especially infection. You may be asked to use a spirometer: After a surgery. If you have a lung problem or a history of smoking. After a long period of time when you have been unable to move or be active. If the spirometer includes an indicator to show the highest number that you have reached, your health care provider or respiratory therapist will help you set a goal. Keep a log of your progress as told by  your health care provider. What are the risks? Breathing too quickly may cause dizziness or cause you to pass out. Take your time so you do not get dizzy or light-headed. If you are in pain, you may need to take pain medicine before doing incentive spirometry. It is harder to take a deep breath if you are having pain. How to use your incentive spirometer  Sit up on the edge of your bed or on a chair. Hold the incentive spirometer so that it is in an upright position. Before you use the spirometer, breathe out normally. Place the mouthpiece in your mouth. Make sure your  lips are closed tightly around it. Breathe in slowly and as deeply as you can through your mouth, causing the piston or the ball to rise toward the top of the chamber. Hold your breath for 3-5 seconds, or for as long as possible. If the spirometer includes a coach indicator, use this to guide you in breathing. Slow down your breathing if the indicator goes above the marked areas. Remove the mouthpiece from your mouth and breathe out normally. The piston or ball will return to the bottom of the chamber. Rest for a few seconds, then repeat the steps 10 or more times. Take your time and take a few normal breaths between deep breaths so that you do not get dizzy or light-headed. Do this every 1-2 hours when you are awake. If the spirometer includes a goal marker to show the highest number you have reached (best effort), use this as a goal to work toward during each repetition. After each set of 10 deep breaths, cough a few times. This will help to make sure that your lungs are clear. If you have an incision on your chest or abdomen from surgery, place a pillow or a rolled-up towel firmly against the incision when you cough. This can help to reduce pain while taking deep breaths and coughing. General tips When you are able to get out of bed: Walk around often. Continue to take deep breaths and cough in order to clear your  lungs. Keep using the incentive spirometer until your health care provider says it is okay to stop using it. If you have been in the hospital, you may be told to keep using the spirometer at home. Contact a health care provider if: You are having difficulty using the spirometer. You have trouble using the spirometer as often as instructed. Your pain medicine is not giving enough relief for you to use the spirometer as told. You have a fever. Get help right away if: You develop shortness of breath. You develop a cough with bloody mucus from the lungs. You have fluid or blood coming from an incision site after you cough. Summary An incentive spirometer is a tool that can help you learn to take long, deep breaths to keep your lungs clear and active. You may be asked to use a spirometer after a surgery, if you have a lung problem or a history of smoking, or if you have been inactive for a long period of time. Use your incentive spirometer as instructed every 1-2 hours while you are awake. If you have an incision on your chest or abdomen, place a pillow or a rolled-up towel firmly against your incision when you cough. This will help to reduce pain. Get help right away if you have shortness of breath, you cough up bloody mucus, or blood comes from your incision when you cough. This information is not intended to replace advice given to you by your health care provider. Make sure you discuss any questions you have with your health care provider. Document Revised: 03/07/2020 Document Reviewed: 03/07/2020 Elsevier Patient Education  2023 Elsevier Inc.    POLAR CARE INFORMATION  [email or web address]  How to use [facility]?  YouTube   [email or web address]  OPERATING INSTRUCTIONS  Start the product With dry hands, connect the transformer to the electrical connection located on the top of the cooler. Next, plug the transformer into an appropriate  electrical outlet. The unit will automatically start running at this point.  To stop  the pump, disconnect electrical power.  Unplug to stop the product when not in use. Unplugging the Polar Care unit turns it off. Always unplug immediately after use. Never leave it plugged in while unattended. Remove pad.    FIRST ADD WATER TO FILL LINE, THEN ICE---Replace ice when existing ice is almost melted  1 Discuss Treatment with your Licensed Health Care Practitioner and Use Only as Prescribed 2 Apply Insulation Barrier & Cold Therapy Pad 3 Check for Moisture 4 Inspect Skin Regularly  Tips and Trouble Shooting Usage Tips 1. Use cubed or chunked ice for optimal performance. 2. It is recommended to drain the Pad between uses. To drain the pad, hold the Pad upright with the hose pointed toward the ground. Depress the black plunger and allow water to drain out. 3. You may disconnect the Pad from the unit without removing the pad from the affected area by depressing the silver tabs on the hose coupling and gently pulling the hoses apart. The Pad and unit will seal itself and will not leak. Note: Some dripping during release is normal. 4. DO NOT RUN PUMP WITHOUT WATER! The pump in this unit is designed to run with water. Running the unit without water will cause permanent damage to the pump. 5. Unplug unit before removing lid.  TROUBLESHOOTING GUIDE Pump not running, Water not flowing to the pad, Pad is not getting cold 1. Make sure the transformer is plugged into the wall outlet. 2. Confirm that the ice and water are filled to the indicated levels. 3. Make sure there are no kinks in the pad. 4. Gently pull on the blue tube to make sure the tube/pad junction is straight. 5. Remove the pad from the treatment site and ll it while the pad is lying at; then reapply. 6. Confirm that the pad couplings are securely attached to the unit. Listen for the double clicks (Figure 1) to confirm the pad couplings are  securely attached.  Leaks    Note: Some condensation on the lines, controller, and pads is unavoidable, especially in warmer climates. 1. If using a [employer] Polar Care Cold Therapy unit with a detachable Cold Therapy Pad, and a leak exists (other than condensation on the lines) disconnect the pad couplings. Make sure the silver tabs on the couplings are depressed before reconnecting the pad to the pump hose; then confirm both sides of the coupling are properly clicked in. 2. If the coupling continues to leak or a leak is detected in the pad itself, stop using it and call [employer] Customer Care at [phone].  Cleaning After use, empty and dry the unit with a soft cloth. Warm water and mild detergent may be used occasionally to clean the pump and tubes.  WARNING: The Polar Care Cube can be cold enough to cause serious injury, including full skin necrosis. Follow these Operating Instructions, and carefully read the Product Insert (see pouch on side of unit) and the Cold Therapy Pad Fitting Instructions (provided with each Cold Therapy Pad) prior to use.   Preoperative Educational Videos for Total Hip, Knee and Shoulder Replacements  To better prepare for surgery, please view our videos that explain the physical activity and discharge planning required to have the best surgical recovery at [facility].  [email or web address]  Questions? Call [phone] or email [email or web address]

## 2025-01-09 LAB — NICOTINE SCREEN, URINE: Cotinine Ql Scrn, Ur: NEGATIVE ng/mL

## 2025-01-18 ENCOUNTER — Ambulatory Visit
Admission: RE | Admit: 2025-01-18 | Discharge: 2025-01-19 | Disposition: A | Attending: Orthopedic Surgery | Admitting: Orthopedic Surgery

## 2025-01-18 ENCOUNTER — Ambulatory Visit: Admitting: Anesthesiology

## 2025-01-18 ENCOUNTER — Encounter: Admission: RE | Disposition: A | Payer: Self-pay | Source: Home / Self Care | Attending: Orthopedic Surgery

## 2025-01-18 ENCOUNTER — Encounter: Payer: Self-pay | Admitting: Orthopedic Surgery

## 2025-01-18 ENCOUNTER — Ambulatory Visit

## 2025-01-18 ENCOUNTER — Other Ambulatory Visit: Payer: Self-pay

## 2025-01-18 DIAGNOSIS — Z87891 Personal history of nicotine dependence: Secondary | ICD-10-CM | POA: Insufficient documentation

## 2025-01-18 DIAGNOSIS — M1712 Unilateral primary osteoarthritis, left knee: Secondary | ICD-10-CM | POA: Insufficient documentation

## 2025-01-18 DIAGNOSIS — F419 Anxiety disorder, unspecified: Secondary | ICD-10-CM | POA: Diagnosis not present

## 2025-01-18 DIAGNOSIS — J449 Chronic obstructive pulmonary disease, unspecified: Secondary | ICD-10-CM | POA: Insufficient documentation

## 2025-01-18 DIAGNOSIS — G4730 Sleep apnea, unspecified: Secondary | ICD-10-CM | POA: Insufficient documentation

## 2025-01-18 DIAGNOSIS — F32A Depression, unspecified: Secondary | ICD-10-CM | POA: Diagnosis not present

## 2025-01-18 DIAGNOSIS — K219 Gastro-esophageal reflux disease without esophagitis: Secondary | ICD-10-CM | POA: Diagnosis not present

## 2025-01-18 DIAGNOSIS — Z96652 Presence of left artificial knee joint: Secondary | ICD-10-CM

## 2025-01-18 DIAGNOSIS — M25762 Osteophyte, left knee: Secondary | ICD-10-CM | POA: Insufficient documentation

## 2025-01-18 HISTORY — PX: TOTAL KNEE ARTHROPLASTY: SHX125

## 2025-01-18 MED ORDER — MIDAZOLAM HCL 5 MG/5ML IJ SOLN
INTRAMUSCULAR | Status: DC | PRN
  Administered 2025-01-18: 2 mg via INTRAVENOUS

## 2025-01-18 MED ORDER — KETOROLAC TROMETHAMINE 15 MG/ML IJ SOLN
7.5000 mg | Freq: Four times a day (QID) | INTRAMUSCULAR | Status: DC
  Administered 2025-01-18 – 2025-01-19 (×3): 7.5 mg via INTRAVENOUS
  Filled 2025-01-18 (×3): qty 1

## 2025-01-18 MED ORDER — CEFAZOLIN SODIUM-DEXTROSE 2-4 GM/100ML-% IV SOLN
2.0000 g | Freq: Four times a day (QID) | INTRAVENOUS | Status: AC
  Administered 2025-01-18 – 2025-01-19 (×2): 2 g via INTRAVENOUS
  Filled 2025-01-18: qty 100

## 2025-01-18 MED ORDER — PROPOFOL 1000 MG/100ML IV EMUL
INTRAVENOUS | Status: AC
  Filled 2025-01-18: qty 100

## 2025-01-18 MED ORDER — ASPIRIN 81 MG PO TBEC
81.0000 mg | DELAYED_RELEASE_TABLET | Freq: Every day | ORAL | Status: DC
  Administered 2025-01-19: 81 mg via ORAL
  Filled 2025-01-18: qty 1

## 2025-01-18 MED ORDER — ACETAMINOPHEN 325 MG PO TABS: 325.0000 mg | ORAL_TABLET | Freq: Four times a day (QID) | ORAL | Status: DC | PRN

## 2025-01-18 MED ORDER — BUPIVACAINE LIPOSOME 1.3 % IJ SUSP
INTRAMUSCULAR | Status: AC
  Filled 2025-01-18: qty 20

## 2025-01-18 MED ORDER — SIMVASTATIN 20 MG PO TABS
40.0000 mg | ORAL_TABLET | Freq: Every day | ORAL | Status: DC
  Administered 2025-01-18: 40 mg via ORAL
  Filled 2025-01-18: qty 2

## 2025-01-18 MED ORDER — METOCLOPRAMIDE HCL 5 MG/ML IJ SOLN: 5.0000 mg | Freq: Three times a day (TID) | INTRAMUSCULAR | Status: DC | PRN

## 2025-01-18 MED ORDER — ONDANSETRON HCL 4 MG PO TABS: 4.0000 mg | ORAL_TABLET | Freq: Four times a day (QID) | ORAL | Status: DC | PRN

## 2025-01-18 MED ORDER — DEXAMETHASONE SOD PHOSPHATE PF 10 MG/ML IJ SOLN
INTRAMUSCULAR | Status: AC
  Filled 2025-01-18: qty 1

## 2025-01-18 MED ORDER — OXYCODONE HCL 5 MG PO TABS
5.0000 mg | ORAL_TABLET | ORAL | Status: DC | PRN
  Administered 2025-01-18 (×2): 5 mg via ORAL
  Filled 2025-01-18 (×2): qty 1

## 2025-01-18 MED ORDER — MORPHINE SULFATE (PF) 4 MG/ML IV SOLN
0.5000 mg | INTRAVENOUS | Status: DC | PRN
  Administered 2025-01-18: 1 mg via INTRAVENOUS
  Filled 2025-01-18: qty 1

## 2025-01-18 MED ORDER — METOCLOPRAMIDE HCL 5 MG PO TABS: 5.0000 mg | ORAL_TABLET | Freq: Three times a day (TID) | ORAL | Status: DC | PRN

## 2025-01-18 MED ORDER — ACETAMINOPHEN 10 MG/ML IV SOLN
INTRAVENOUS | Status: DC | PRN
  Administered 2025-01-18: 1000 mg via INTRAVENOUS

## 2025-01-18 MED ORDER — SERTRALINE HCL 50 MG PO TABS
100.0000 mg | ORAL_TABLET | Freq: Every day | ORAL | Status: DC
  Administered 2025-01-18 – 2025-01-19 (×2): 100 mg via ORAL
  Filled 2025-01-18 (×2): qty 2

## 2025-01-18 MED ORDER — PANTOPRAZOLE SODIUM 40 MG PO TBEC
40.0000 mg | DELAYED_RELEASE_TABLET | Freq: Every day | ORAL | Status: DC
  Administered 2025-01-18 – 2025-01-19 (×2): 40 mg via ORAL
  Filled 2025-01-18 (×2): qty 1

## 2025-01-18 MED ORDER — ORAL CARE MOUTH RINSE: 15.0000 mL | Freq: Once | OROMUCOSAL | Status: AC

## 2025-01-18 MED ORDER — SODIUM CHLORIDE (PF) 0.9 % IJ SOLN
INTRAMUSCULAR | Status: DC | PRN
  Administered 2025-01-18: 70 mL via INTRAMUSCULAR

## 2025-01-18 MED ORDER — CHLORHEXIDINE GLUCONATE 0.12 % MT SOLN
OROMUCOSAL | Status: AC
  Filled 2025-01-18: qty 15

## 2025-01-18 MED ORDER — PHENYLEPHRINE 80 MCG/ML (10ML) SYRINGE FOR IV PUSH (FOR BLOOD PRESSURE SUPPORT)
PREFILLED_SYRINGE | INTRAVENOUS | Status: DC | PRN
  Administered 2025-01-18 (×3): 80 ug via INTRAVENOUS

## 2025-01-18 MED ORDER — TRAZODONE HCL 100 MG PO TABS
100.0000 mg | ORAL_TABLET | Freq: Every day | ORAL | Status: DC
  Administered 2025-01-18: 100 mg via ORAL
  Filled 2025-01-18: qty 1

## 2025-01-18 MED ORDER — DOCUSATE SODIUM 100 MG PO CAPS
100.0000 mg | ORAL_CAPSULE | Freq: Two times a day (BID) | ORAL | Status: DC
  Administered 2025-01-18 – 2025-01-19 (×2): 100 mg via ORAL
  Filled 2025-01-18 (×2): qty 1

## 2025-01-18 MED ORDER — LACTATED RINGERS IV SOLN: INTRAVENOUS | Status: DC

## 2025-01-18 MED ORDER — SODIUM CHLORIDE (PF) 0.9 % IJ SOLN
INTRAMUSCULAR | Status: AC
  Filled 2025-01-18: qty 20

## 2025-01-18 MED ORDER — PREGABALIN 50 MG PO CAPS
150.0000 mg | ORAL_CAPSULE | Freq: Two times a day (BID) | ORAL | Status: DC
  Administered 2025-01-18 – 2025-01-19 (×2): 150 mg via ORAL
  Filled 2025-01-18 (×2): qty 3

## 2025-01-18 MED ORDER — PROPOFOL 500 MG/50ML IV EMUL
INTRAVENOUS | Status: DC | PRN
  Administered 2025-01-18: 125 ug/kg/min via INTRAVENOUS

## 2025-01-18 MED ORDER — TRANEXAMIC ACID-NACL 1000-0.7 MG/100ML-% IV SOLN
1000.0000 mg | INTRAVENOUS | Status: AC
  Administered 2025-01-18 (×2): 1000 mg via INTRAVENOUS

## 2025-01-18 MED ORDER — EPHEDRINE SULFATE-NACL 50-0.9 MG/10ML-% IV SOSY
PREFILLED_SYRINGE | INTRAVENOUS | Status: DC | PRN
  Administered 2025-01-18: 10 mg via INTRAVENOUS

## 2025-01-18 MED ORDER — TRANEXAMIC ACID-NACL 1000-0.7 MG/100ML-% IV SOLN
INTRAVENOUS | Status: AC
  Filled 2025-01-18: qty 100

## 2025-01-18 MED ORDER — OXYCODONE HCL 5 MG PO TABS
10.0000 mg | ORAL_TABLET | ORAL | Status: DC | PRN
  Administered 2025-01-19 (×2): 10 mg via ORAL
  Filled 2025-01-18 (×2): qty 2

## 2025-01-18 MED ORDER — ONDANSETRON HCL 4 MG/2ML IJ SOLN: 4.0000 mg | Freq: Four times a day (QID) | INTRAMUSCULAR | Status: DC | PRN

## 2025-01-18 MED ORDER — MENTHOL 3 MG MT LOZG: 1.0000 | LOZENGE | OROMUCOSAL | Status: DC | PRN

## 2025-01-18 MED ORDER — SODIUM CHLORIDE 0.9 % IR SOLN
Status: DC | PRN
  Administered 2025-01-18: 3000 mL

## 2025-01-18 MED ORDER — PHENYLEPHRINE 80 MCG/ML (10ML) SYRINGE FOR IV PUSH (FOR BLOOD PRESSURE SUPPORT)
PREFILLED_SYRINGE | INTRAVENOUS | Status: AC
  Filled 2025-01-18: qty 10

## 2025-01-18 MED ORDER — BUPIVACAINE HCL (PF) 0.5 % IJ SOLN
INTRAMUSCULAR | Status: AC
  Filled 2025-01-18: qty 10

## 2025-01-18 MED ORDER — ONDANSETRON HCL 4 MG/2ML IJ SOLN
INTRAMUSCULAR | Status: AC
  Filled 2025-01-18: qty 2

## 2025-01-18 MED ORDER — ENOXAPARIN SODIUM 30 MG/0.3ML IJ SOSY
30.0000 mg | PREFILLED_SYRINGE | Freq: Two times a day (BID) | INTRAMUSCULAR | Status: DC
  Administered 2025-01-19: 30 mg via SUBCUTANEOUS
  Filled 2025-01-18: qty 0.3

## 2025-01-18 MED ORDER — CEFAZOLIN SODIUM-DEXTROSE 2-4 GM/100ML-% IV SOLN
INTRAVENOUS | Status: AC
  Filled 2025-01-18: qty 100

## 2025-01-18 MED ORDER — FUROSEMIDE 20 MG PO TABS
20.0000 mg | ORAL_TABLET | Freq: Every day | ORAL | Status: DC
  Administered 2025-01-19: 20 mg via ORAL
  Filled 2025-01-18: qty 1

## 2025-01-18 MED ORDER — ALBUTEROL SULFATE (2.5 MG/3ML) 0.083% IN NEBU: 2.5000 mg | INHALATION_SOLUTION | RESPIRATORY_TRACT | Status: DC | PRN

## 2025-01-18 MED ORDER — FENTANYL CITRATE (PF) 100 MCG/2ML IJ SOLN: 25.0000 ug | INTRAMUSCULAR | Status: DC | PRN

## 2025-01-18 MED ORDER — ONDANSETRON HCL 4 MG/2ML IJ SOLN
INTRAMUSCULAR | Status: DC | PRN
  Administered 2025-01-18: 4 mg via INTRAVENOUS

## 2025-01-18 MED ORDER — ACETAMINOPHEN 10 MG/ML IV SOLN
INTRAVENOUS | Status: AC
  Filled 2025-01-18: qty 100

## 2025-01-18 MED ORDER — EPHEDRINE 5 MG/ML INJ
INTRAVENOUS | Status: AC
  Filled 2025-01-18: qty 5

## 2025-01-18 MED ORDER — FLUTICASONE FUROATE-VILANTEROL 200-25 MCG/ACT IN AEPB
1.0000 | INHALATION_SPRAY | Freq: Every day | RESPIRATORY_TRACT | Status: DC
  Filled 2025-01-18: qty 28

## 2025-01-18 MED ORDER — SODIUM CHLORIDE 0.9 % IV SOLN: INTRAVENOUS | Status: DC

## 2025-01-18 MED ORDER — CEFAZOLIN SODIUM-DEXTROSE 2-4 GM/100ML-% IV SOLN
2.0000 g | INTRAVENOUS | Status: AC
  Administered 2025-01-18: 2 g via INTRAVENOUS

## 2025-01-18 MED ORDER — SURGIRINSE WOUND IRRIGATION SYSTEM - OPTIME: TOPICAL | Status: DC | PRN

## 2025-01-18 MED ORDER — CHLORHEXIDINE GLUCONATE 4 % EX SOLN: 1.0000 | CUTANEOUS | 1 refills | Status: AC

## 2025-01-18 MED ORDER — PROPOFOL 10 MG/ML IV BOLUS
INTRAVENOUS | Status: DC | PRN
  Administered 2025-01-18: 30 mg via INTRAVENOUS
  Administered 2025-01-18: 40 mg via INTRAVENOUS

## 2025-01-18 MED ORDER — ACETAMINOPHEN 500 MG PO TABS
1000.0000 mg | ORAL_TABLET | Freq: Three times a day (TID) | ORAL | Status: DC
  Administered 2025-01-19 (×2): 1000 mg via ORAL
  Filled 2025-01-18: qty 2

## 2025-01-18 MED ORDER — PHENOL 1.4 % MT LIQD: 1.0000 | OROMUCOSAL | Status: DC | PRN

## 2025-01-18 MED ORDER — DEXAMETHASONE SOD PHOSPHATE PF 10 MG/ML IJ SOLN
8.0000 mg | Freq: Once | INTRAMUSCULAR | Status: AC
  Administered 2025-01-18: 8 mg via INTRAVENOUS

## 2025-01-18 MED ORDER — MUPIROCIN 2 % EX OINT: 1.0000 | TOPICAL_OINTMENT | Freq: Two times a day (BID) | CUTANEOUS | 0 refills | Status: AC

## 2025-01-18 MED ORDER — BUPIVACAINE HCL (PF) 0.5 % IJ SOLN
INTRAMUSCULAR | Status: DC | PRN
  Administered 2025-01-18: 2.5 mL

## 2025-01-18 MED ORDER — MIDAZOLAM HCL 2 MG/2ML IJ SOLN
INTRAMUSCULAR | Status: AC
  Filled 2025-01-18: qty 2

## 2025-01-18 MED ORDER — CHLORHEXIDINE GLUCONATE 0.12 % MT SOLN
15.0000 mL | Freq: Once | OROMUCOSAL | Status: AC
  Administered 2025-01-18: 15 mL via OROMUCOSAL

## 2025-01-18 NOTE — H&P (Signed)
 History of Present Illness: The patient is an 64 y.o. female seen in clinic today for history and physical for left total knee arthroplasty with Dr. Lorelle on 01/18/2025. Patient has advanced left knee osteoarthritis with complete loss of joint space in the medial compartment of the left knee. She has underwent years of conservative treatment consisting of cortisone injections and gel injections with diminishing results. Patient's pain is interfering with her quality of life and activities daily living. Her last shot was a hyaluronic acid viscosupplementation injection in September of this year. She reports minimal short-term relief from that shot. At this point her left knee is functionally limiting her on a daily basis and preventing her from participating tibias bending her knee or squatting. She denies any locking or catching of the knee. The patient denies fevers, chills, numbness, tingling, shortness of breath, chest pain, recent illness, or any trauma.  The patient is a nondiabetic with an A1c of 5.7 a BMI of 29. She has recently quit smoking.  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 Mavis).  Coccyodynia 02/25/2020  Colon polyp  Depression  GERD without esophagitis 01/28/2017  High cholesterol  Lumbar facet arthropathy 10/02/2018  Lumbar spondylosis 10/02/2018  Migraine without aura and without status migrainosus, not intractable 01/29/2017  Other hyperlipidemia 01/28/2017  Prolapsed internal hemorrhoids 12/13/2017  Prolapsed internal hemorrhoids, grade 3, s/p ligation/pexy  Recurrent major depressive disorder, in full remission () 01/28/2017  Sleep apnea   Past Surgical History: Past Surgical History:  Procedure Laterality Date  OTHER SURGERY Right 2011  right  foot surgery  COLONOSCOPY 08/20/2011  Dr. EMERSON Mariner @ [facility] - Adenomatous Polyp, FHPolyps(f)(70s) rpt 5 yrs per [facility], ltr mailed  COLONOSCOPY 08/07/2017  Anal condyloma/Diverticulosis/PHx CP/Repeat 90yrs/[facility]  EGD 03/17/2019  GERD/No Repeat/M[facility]  Trigger Finger Release Right 09/10/2022  Ring Finger-Dr. Kathlynn  COLONOSCOPY  ECTOPIC PREGNANCY SURGERY  HYSTERECTOMY VAGINAL  TAH BSO  LASER ABLATION CONDOLAMATA  SALPINGO OOPHORECTOMY Bilateral   Past Family History: Family History  Problem Relation Age of Onset  Anxiety Mother  Breast cancer Mother  COPD Father  Diabetes Brother   Medications: Current Outpatient Medications  Medication Sig Dispense Refill  aspirin  81 MG EC tablet Take 1 tablet (81 mg total) by mouth once daily 30 tablet 11  budesonide -formoteroL  (SYMBICORT ) 160-4.5 mcg/actuation inhaler Inhale 2 inhalations into the lungs 2 (two) times daily  FUROsemide  (LASIX ) 20 MG tablet Take 1 tablet (20 mg total) by mouth once daily 90 tablet 3  HYDROcodone -acetaminophen  (NORCO) 10-325 mg tablet TK 1 T PO Q 8 H PRF SEVERE PAIN  meloxicam  (MOBIC ) 15 MG tablet Take 15 mg by mouth once daily  naloxone  (NARCAN ) 4 mg/actuation nasal spray Place 1 spray into one nostril once  pantoprazole  (PROTONIX ) 40 MG DR tablet TAKE 1 TABLET(40 MG) BY MOUTH TWICE DAILY 180 tablet 1  pregabalin  (LYRICA ) 150 MG capsule TAKE 1 CAPSULE(150 MG) BY MOUTH TWICE DAILY 60 capsule 5  sertraline  (ZOLOFT ) 100 MG tablet TAKE 1 TABLET(100 MG) BY MOUTH DAILY 90 tablet 1  simvastatin  (ZOCOR ) 40 MG tablet TAKE 1 TABLET(40 MG) BY MOUTH AT BEDTIME 90 tablet 1  traZODone  (DESYREL ) 100 MG tablet TAKE 1 TABLET(100 MG) BY MOUTH AT BEDTIME 90 tablet 1   No current facility-administered medications for this visit.   Allergies: Allergies  Allergen Reactions  Wellbutrin [Bupropion Hcl] Anxiety    Visit Vitals: Vitals:  12/29/24 0944  BP: 120/82    Review of Systems:  A comprehensive 14 point ROS was performed,  reviewed, and the pertinent orthopaedic findings are documented in the HPI.  Physical Exam: General:  Well developed, well nourished, no apparent distress, normal affect, normal gait with no antalgic component.   HEENT: Head normocephalic, atraumatic, PERRL.   Abdomen: Soft, non tender, non distended, Bowel sounds present.  Heart: Examination of the heart reveals regular, rate, and rhythm. There is no murmur noted on ascultation. There is a normal apical pulse.  Lungs: Lungs are clear to auscultation. There is no wheeze, rhonchi, or crackles. There is normal expansion of bilateral chest walls.   Comprehensive Knee Exam: Gait Antalgic on the left  Alignment Neutral   Inspection Left  Skin Normal appearance with no obvious deformity. No ecchymosis or erythema.  Soft Tissue No focal soft tissue swelling  Quad Atrophy None   Palpation  Left  Tenderness Medial joint line parapatellar tenderness to palpation  Crepitus + patellofemoral crepitus  Effusion None   Range of Motion Left  Flexion 0-115  Extension Full knee extension without hyperextension   Ligamentous Exam Left  Lachman Normal  Valgus 0 Normal  Valgus 30 Normal  Varus 0 Normal  Varus 30 Normal  Anterior Drawer Normal  Posterior Drawer Normal   Meniscal Exam Left  Hyperflexion Test Positive  Hyperextension Test Positive  McMurray's Positive   Neurovascular Left  Quadriceps Strength 5/5  Hamstring Strength 5/5  Hip Abductor Strength 4/5  Distal Motor Normal  Distal Sensory Normal light touch sensation  Distal Pulses Normal    Imaging Studies: Left knee x-rays reviewed by me today from 10/14/2024 show complete loss of joint space in the medial compartment left knee with varus alignment. Sclerotic changes along the medial tibial plateau. Patellofemoral spurring noted with moderate to severe patellofemoral osteoarthritis with spurring and sclerotic changes throughout the trochlea. Patella tracks well.  No evidence of acute bony abnormality  Assessment:  Advanced left knee osteoarthritis  Plan: 64 year old female with advanced left knee osteoarthritis. Patient's pain interferes with her quality of life and activities day living. She has had little relief with conservative treatment. Risks, benefits, complications of a left total knee arthroplasty have been discussed with the patient. Patient has agreed and consented the procedure with Dr. Lorelle on 01/18/2025.  The hospitalization and post-operative care and rehabilitation were also discussed. The use of perioperative antibiotics and DVT prophylaxis were discussed. The risk, benefits and alternatives to a surgical intervention were discussed at length with the patient. The patient was also advised of risks related to the medical comorbidities and elevated body mass index (BMI). A lengthy discussion took place to review the most common complications including but not limited to: stiffness, loss of function, complex regional pain syndrome, deep vein thrombosis, pulmonary embolus, heart attack, stroke, infection, wound breakdown, numbness, intraoperative fracture, damage to nerves, tendon,muscles, arteries or other blood vessels, death and other possible complications from anesthesia. The patient was told that we will take steps to minimize these risks by using sterile technique, antibiotics and DVT prophylaxis when appropriate and follow the patient postoperatively in the office setting to monitor progress. The possibility of recurrent pain, no improvement in pain and actual worsening of pain were also discussed with the patient.   All questions answered patient agrees with above plan for left total knee arthroplasty.

## 2025-01-18 NOTE — Anesthesia Procedure Notes (Signed)
 Spinal  Patient location during procedure: OR Start time: 01/18/2025 2:18 PM End time: 01/18/2025 2:25 PM Reason for block: surgical anesthesia  Staffing Performed: resident/CRNA  Authorized by: Dario Barter, MD   Performed by: Lorriane Arabia, CRNA  Preanesthetic Checklist Completed: patient identified, IV checked, site marked, risks and benefits discussed, surgical consent, monitors and equipment checked, pre-op evaluation and timeout performed Spinal Block Patient position: sitting Prep: Betadine Patient monitoring: heart rate, continuous pulse ox, blood pressure and cardiac monitor Approach: midline Location: L3-4 Injection technique: single-shot Needle Needle type: Whitacre and Introducer  Needle gauge: 24 G Needle length: 9 cm Assessment Events: CSF return  Additional Notes Negative paresthesia. Negative blood return. Positive free-flowing CSF. Expiration date of kit checked and confirmed. Patient tolerated procedure well, without complications.

## 2025-01-18 NOTE — Interval H&P Note (Signed)
 Patient history and physical updated. Consent reviewed including risks, benefits, and alternatives to surgery. Patient agrees with above plan to proceed with left total knee arthroplasty.

## 2025-01-18 NOTE — Discharge Instructions (Signed)

## 2025-01-18 NOTE — Discharge Summary (Signed)
 " Physician Discharge Summary  Patient ID: Vanessa Greer MRN: [id number] DOB/AGE: 63/12/1960 62 y.o.  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   Lumbar radiculopathy 04/21/2024   Medication management 04/21/2024   Pericardial effusion 12/18/2023   Chronic bilateral thoracic back pain 06/11/2023   Cervicalgia 06/11/2023   Bilateral primary osteoarthritis of knee 12/18/2022   Coccyodynia 02/25/2020   Lumbar facet arthropathy 10/02/2018   Lumbar spondylosis 10/02/2018   Lumbar degenerative disc disease 10/02/2018   Chronic pain syndrome 10/02/2018   Anal condylomata s/p laser ablation 12/13/2017 12/13/2017   Prolapsed internal hemorrhoids, grade 3, s/p ligation/pexy 12/13/2017 12/13/2017   Chronic bilateral low back pain with bilateral sciatica 01/29/2017   GERD without esophagitis 01/28/2017    Past Medical History:  Diagnosis Date   Anxiety    Back pain    COPD (chronic obstructive pulmonary disease) (HCC)    Current smoker    DDD (degenerative disc disease), lumbar    Depression    GERD (gastroesophageal reflux disease)    High cholesterol    Lumbar radiculitis    Lung nodule    Osteoarthritis of left knee, unspecified osteoarthritis type    Pneumothorax 1982   Sleep apnea      Transfusion: None.   Consultants (if any):   Discharged Condition: Improved  Hospital Course: Vanessa Greer is an 64 y.o. female who was admitted 01/18/2025 with a diagnosis of Primary osteoarthritis of the left knee and went to the operating room on 01/18/2025 and underwent the above named procedures.    Surgeries: Procedures: ARTHROPLASTY, KNEE, TOTAL on 01/18/2025 Patient tolerated the surgery well. Taken to PACU where she was  stabilized and then transferred to the orthopedic floor.  Started on Lovenox  30mg  q 12 hrs. Foot pumps applied bilaterally at 80 mm. Heels elevated on bed with rolled towels. No evidence of DVT. Negative Homan. Physical therapy started on day #1 for gait training and transfer. OT started day #1 for ADL and assisted devices.  Patient's IV was removed on POD1  Implants: Femur: Persona Size 6 CR narrow PPS   Tibia: Persona Size D OsseoTi  Poly: 10 mm MC  Patella: 32x86mm symmetric   She was given perioperative antibiotics:  Anti-infectives (From admission, onward)    Start     Dose/Rate Route Frequency Ordered Stop   01/18/25 2000  ceFAZolin  (ANCEF ) IVPB 2g/100 mL premix        2 g 200 mL/hr over 30 Minutes Intravenous Every 6 hours 01/18/25 1729 01/19/25 0232   01/18/25 1115  ceFAZolin  (ANCEF ) IVPB 2g/100 mL premix        2 g 200 mL/hr over 30 Minutes Intravenous On call to O.R. 01/18/25 1103 01/18/25 1442     .  She was given sequential compression devices, early ambulation, and Lovenox  for DVT prophylaxis.  She benefited maximally from the hospital stay and there were no complications.    Recent vital signs:  Vitals:   01/18/25 2333 01/19/25 0424  BP: (!) 89/65 (!) 112/54  Pulse: 79 68  Resp: 18 18  Temp: (!) 97 F (36.1 C) (!) 97.5 F (36.4 C)  SpO2:      Recent laboratory studies:  Lab Results  Component Value Date   HGB 11.2 (L) 01/19/2025  HGB 14.3 12/13/2017   Lab Results  Component Value Date   WBC 13.8 (H) 01/19/2025   PLT 211 01/19/2025   No results found for: INR Lab Results  Component Value Date   NA 142 01/19/2025   K 4.5 01/19/2025   CL 111 01/19/2025   CO2 23 01/19/2025   BUN 12 01/19/2025   CREATININE 0.63 01/19/2025   GLUCOSE 128 (H) 01/19/2025    Discharge Medications:   Allergies as of 01/19/2025       Reactions   Bupropion Anxiety        Medication List     TAKE these medications    albuterol  108 (90 Base) MCG/ACT  inhaler Commonly known as: VENTOLIN  HFA Inhale 2 puffs into the lungs every 4 (four) hours as needed for wheezing or shortness of breath.   aspirin  EC 81 MG tablet Take 81 mg by mouth daily. Swallow whole.   budesonide -formoterol  160-4.5 MCG/ACT inhaler Commonly known as: SYMBICORT  Inhale 2 puffs into the lungs 2 (two) times daily.   chlorhexidine  4 % external liquid Commonly known as: HIBICLENS  Apply 15 mLs (1 Application total) topically as directed for 30 doses. Use as directed daily for 5 days every other week for 6 weeks.   cyclobenzaprine  5 MG tablet Commonly known as: FLEXERIL  Take 1-2 tablets (5-10 mg total) by mouth daily as needed for muscle spasms.   enoxaparin  40 MG/0.4ML injection Commonly known as: LOVENOX  Inject 0.4 mLs (40 mg total) into the skin daily.   furosemide  20 MG tablet Commonly known as: LASIX  Take 20 mg by mouth daily.   HYDROcodone -acetaminophen  10-325 MG tablet Commonly known as: Norco Take 1 tablet by mouth every 6 (six) hours as needed for severe pain (pain score 7-10). Must last 30 days. For chronic pain. Start taking on: January 22, 2025   HYDROcodone -acetaminophen  10-325 MG tablet Commonly known as: Norco Take 1 tablet by mouth every 6 (six) hours as needed for severe pain (pain score 7-10). Must last 30 days. For chronic pain. Start taking on: February 21, 2025   HYDROcodone -acetaminophen  10-325 MG tablet Commonly known as: Norco Take 1 tablet by mouth every 6 (six) hours as needed for severe pain (pain score 7-10). Must last 30 days. For chronic pain. Start taking on: March 23, 2025   Lyrica  150 MG capsule Generic drug: pregabalin  150 mg 2 (two) times daily.   multivitamin with minerals Tabs tablet Take 1 tablet by mouth daily.   mupirocin  ointment 2 % Commonly known as: BACTROBAN  Place 1 Application into the nose 2 (two) times daily for 60 doses. Use as directed 2 times daily for 5 days every other week for 6 weeks.   naloxone   4 MG/0.1ML Liqd nasal spray kit Commonly known as: NARCAN  Place 1 spray into the nose as needed for up to 365 doses (for opioid-induced respiratory depresssion). In case of emergency (overdose), spray once into each nostril. If no response within 3 minutes, repeat application and call 911.   oxyCODONE  5 MG immediate release tablet Commonly known as: Oxy IR/ROXICODONE  Take 1-2 tablets (5-10 mg total) by mouth every 4 (four) hours as needed for moderate pain (pain score 4-6).   pantoprazole  40 MG tablet Commonly known as: PROTONIX  Take 40 mg by mouth 2 (two) times daily.   sertraline  100 MG tablet Commonly known as: ZOLOFT  Take 100 mg by mouth daily.   simvastatin  40 MG tablet Commonly known as: ZOCOR  Take 40 mg by mouth at bedtime.   traZODone  100  MG tablet Commonly known as: DESYREL  Take 100 mg by mouth at bedtime.               Durable Medical Equipment  (From admission, onward)           Start     Ordered   01/18/25 1720  For home use only DME Walker rolling  Once       Question Answer Comment  Walker: With 5 Inch Wheels   Patient needs a walker to treat with the following condition S/P TKR (total knee replacement), left      01/18/25 1719   01/18/25 1719  For home use only DME Bedside commode  Once       Question:  Patient needs a bedside commode to treat with the following condition  Answer:  S/P TKR (total knee replacement), left   01/18/25 1719            Diagnostic Studies: DG Knee Left Port Result Date: 01/18/2025 CLINICAL DATA:  Status post total knee arthroplasty. EXAM: PORTABLE LEFT KNEE - 1-2 VIEW COMPARISON:  None Available. FINDINGS: Status post left total knee arthroplasty with normal alignment. No acute fracture. Expected postoperative soft tissue swelling and soft tissue/intra-articular air anteriorly. IMPRESSION: Status post left total knee arthroplasty with normal alignment. No evidence of acute complication. Electronically Signed   By:  Harrietta Sherry M.D.   On: 01/18/2025 17:25   Disposition: Plan for discharge home today pending progress with PT.   Follow-up Information     Charlene Debby BROCKS, PA-C Follow up in 14 day(s).   Specialties: Orthopedic Surgery, Emergency Medicine Why: Skin check. Contact information: [address] [place] [address] [phone]                Signed: VERLINDA KRYSTAL RIGGERS 01/19/2025, 7:23 AM  "

## 2025-01-18 NOTE — Op Note (Signed)
 Patient Name: Vanessa Greer  FMW:[id number]  Pre-Operative Diagnosis: Left knee Osteoarthritis  Post-Operative Diagnosis: (same)  Procedure: Left Total Knee Arthroplasty  Components/Implants: Femur: Persona Size 6 CR narrow PPS   Tibia: Persona Size D OsseoTi  Poly: 10 mm MC  Patella: 32x25mm symmetric  Femoral Valgus Cut Angle: 5 degrees  Distal Femoral Re-cut: none  Patella Resurfacing: yes   Date of Surgery: 01/18/2025  Surgeon: Arthea Sheer MD  Assistant: [facility] RNFA (present and scrubbed throughout the case, critical for assistance with exposure, retraction, instrumentation, and closure)   Anesthesia: Spinal   Tourniquet Time: 40 min  EBL: 25cc  IVF: 600cc  Complications: None   Brief history: The patient is a 64 year old female with a history of osteoarthritis of the left knee with pain limiting their range of motion and activities of daily living, which has failed multiple attempts at conservative therapy.  The risks and benefits of total knee arthroplasty as definitive surgical treatment were discussed with the patient, who opted to proceed with the operation.  After outpatient medical clearance and optimization was completed the patient was admitted to [facility] for the procedure.  All preoperative films were reviewed and an appropriate surgical plan was made prior to surgery. Preoperative range of motion was 0 to 115.    Description of procedure: The patient was brought to the operating room where laterality was confirmed by all those present to be the left side.   Spinal anesthesia was administered and the patient received an intravenous dose of antibiotics for surgical prophylaxis and a dose of tranexamic acid .  Patient is positioned supine on the operating room table with all bony prominences well-padded.  A well-padded tourniquet was applied to the left thigh.  The knee was then prepped and draped in usual sterile fashion with multiple  layers of adhesive and nonadhesive drapes.  All of those present in the operating room participated in a surgical timeout laterality and patient were confirmed.   An Esmarch was wrapped around the extremity and the leg was elevated and the knee flexed.  The tourniquet was inflated to a pressure of 250 mmHg. The Esmarch was removed and the leg was brought down to full extension.  The patella and tibial tubercle identified and outlined using a marking pen and a midline skin incision was made with a knife carried through the subcutaneous tissue down to the extensor retinaculum.  After exposure of the extensor mechanism the medial parapatellar arthrotomy was performed with a scalpel and electrocautery extending down medial and distal to the tibial tubercle taking care to avoid incising the patellar tendon.   A standard medial release was performed over the proximal tibia.  The knee was brought into extension in order to excise the fat pad taking care not to damage the patella tendon.  The superior soft tissue was removed from the anterior surface of the distal femur to visualize for the procedure.  The knee was then brought into flexion with the patella subluxed laterally and subluxing the tibia anteriorly.  The ACL was transected and removed with electrocautery and additional soft tissue was removed from the proximal surface of the tibia to fully expose. The PCL was found to be intact and was preserved.  An extramedullary tibial cutting guide was then applied to the leg with a spring-loaded ankle clamp placed around the distal tibia just above the malleoli the angulation of the guide was adjusted to give some posterior slope in the tibial resection with an appropriate  varus/valgus alignment.  The resection guide was then pinned to the proximal tibia and the proximal tibial surface was resected with an oscillating saw.  Careful attention was paid to ensure the blade did not disrupt any of the soft tissues including  any lateral or medial ligament.  Attention was then turned to the femur, with the knee slightly flexed a opening drill was used to enter the medullary canal of the femur.  After removing the drill marrow was suctioned out to decompress the distal femur.  An intramedullary femoral guide was then inserted into the drill hole and the alignment guide was seated firmly against the distal end of the medial femoral condyle.  The distal femoral cutting guide was then attached and pinned securely to the anterior surface of the femur and the intramedullary rod and alignment guide was removed.  Distal femur resection was then performed with an oscillating saw with retractors protecting medial and laterally.   The distal cutting block was then removed and the extension gap was checked with a spacer.  Extension gap was found to be appropriately sized to accommodate the spacer block.   The femoral sizing guide was then placed securely into the posterior condyles of the femur and the femoral size was measured and determined to be 6.  The size 6; 4-in-1 cutting guide was placed in position and secured with 2 pins.  The anterior posterior and chamfer resections were then performed with an oscillating saw.  Bony fragments and osteophytes were then removed.  Using a lamina spreader the posterior medial and lateral condyles were checked for additional osteophytes and posterior soft tissue remnants.  Any remaining meniscus was removed at this time.  Periarticular injection was performed in the meniscal rims and posterior capsule with aspiration performed to ensure no intravascular injection.   The tibia was then exposed and the tibial trial was pinned onto the plateau after confirming appropriate orientation and rotation.  Using the drill bushing the tibia was prepared to the appropriate drill depth.  Tibial broach impactor was then driven through the punch guide using a mallet.  The femoral trial component was then inserted  onto the femur.  A trial tibial polyethylene bearing was then placed and the knee was reduced.  The knee achieved full extension with no hyperextension and was found to be balanced in flexion and extension with the trials in place.  The knee was then brought into full extension the patella was everted and held with 2 Kocher clamps.  The articular surface of the patella was then resected with an patella reamer and saw after careful measurement with a caliper.  The patella was then prepared with the drill guide and a trial patella was placed.  The knee was then taken through range of motion and it was found that the patella articulated appropriately with the trochlea and good patellofemoral motion without subluxation.    The correct final components for implantation were confirmed and opened by the circulator nurse.  The knee was irrigated with normal saline via pulsatile lavage to remove any bony debris or soft tissue.  The prepared surface of the tibia was exposed and the tibial component was implanted with good bony contact.  The femoral component was then placed and impacted showing good coverage and a good snug fit.  The patella was then cleared off and the patella compression tool was used to apply the patellar component with symmetric compression onto the patella.  The tibial component was then irrigated and cleared  of any debris and a real polyethylene component was placed and engaged with the locking mechanism.  The knee was then injected with the particular cocktail.  The knee was taken through range of motion and found to be stable in flexion and extension with patellar tracking.  The knee was then irrigated with copious amount of normal saline via pulsatile lavage to remove all loose bodies and other debris.  The knee was then irrigated with surgiphor betadine based wash and reirrigated with saline.  The tourniquet was then dropped and all bleeding vessels were identified and coagulated.  The arthrotomy  was approximated with #1 Vicryl and closed with #1 Stratafix suture.  The knee was brought into slight flexion and the subcutaneous tissues were closed with 0 Vicryl, 2-0 Vicryl and a running subcuticular 4-0 stratafix barbed suture.  Skin was then glued with Dermabond.  A sterile adhesive dressing was then placed along with a sequential compression device to the calf, a Ted stocking, and a cryotherapy cuff.   Sponge, needle, and Lap counts were all correct at the end of the case.   The patient was transferred off of the operating room table to a hospital bed, good pulses were found distally on the operative side.  The patient was transferred to the recovery room in stable condition.

## 2025-01-18 NOTE — Transfer of Care (Signed)
 Immediate Anesthesia Transfer of Care Note  Patient: Vanessa Greer  Procedure(s) Performed: ARTHROPLASTY, KNEE, TOTAL (Left: Knee)  Patient Location: PACU  Anesthesia Type:Spinal   Level of Consciousness: awake, alert , and oriented  Airway & Oxygen Therapy: Patient Spontanous Breathing  Post-op Assessment: Report given to RN and Post -op Vital signs reviewed and stable  Post vital signs: Reviewed and stable  Last Vitals:  Vitals Value Taken Time  BP 123/77 1610  Temp 36.3 C 01/18/25 16:10  Pulse 78 01/18/25 16:11  Resp 15 01/18/25 16:11  SpO2 99 % 01/18/25 16:11  Vitals shown include unfiled device data.  Last Pain:  Vitals:   01/18/25 1117  TempSrc: Temporal  PainSc: 0-No pain         Complications: No notable events documented.

## 2025-01-18 NOTE — Anesthesia Preprocedure Evaluation (Signed)
"                                    Anesthesia Evaluation  Patient identified by MRN, date of birth, ID band Patient awake    Reviewed: Allergy & Precautions, H&P , NPO status , Patient's Chart, lab work & pertinent test results, reviewed documented beta blocker date and time   History of Anesthesia Complications Negative for: history of anesthetic complications  Airway Mallampati: III  TM Distance: >3 FB Neck ROM: full    Dental  (+) Dental Advidsory Given, Caps, Missing   Pulmonary neg shortness of breath, sleep apnea and Continuous Positive Airway Pressure Ventilation , COPD, neg recent URI, Current Smoker   Pulmonary exam normal breath sounds clear to auscultation       Cardiovascular Exercise Tolerance: Good negative cardio ROS Normal cardiovascular exam Rhythm:regular Rate:Normal     Neuro/Psych  PSYCHIATRIC DISORDERS Anxiety Depression    negative neurological ROS     GI/Hepatic Neg liver ROS,GERD  ,,  Endo/Other  negative endocrine ROS    Renal/GU negative Renal ROS  negative genitourinary   Musculoskeletal   Abdominal   Peds  Hematology negative hematology ROS (+)   Anesthesia Other Findings Past Medical History: No date: Anxiety No date: Back pain No date: COPD (chronic obstructive pulmonary disease) (HCC) No date: Current smoker No date: DDD (degenerative disc disease), lumbar No date: Depression No date: GERD (gastroesophageal reflux disease) No date: High cholesterol No date: Lumbar radiculitis No date: Lung nodule No date: Osteoarthritis of left knee, unspecified osteoarthritis type 1982: Pneumothorax No date: Sleep apnea   Reproductive/Obstetrics negative OB ROS                              Anesthesia Physical Anesthesia Plan  ASA: 2  Anesthesia Plan: Spinal   Post-op Pain Management:    Induction: Intravenous  PONV Risk Score and Plan: 1 and Propofol  infusion and TIVA  Airway Management  Planned: Natural Airway and Simple Face Mask  Additional Equipment:   Intra-op Plan:   Post-operative Plan:   Informed Consent: I have reviewed the patients History and Physical, chart, labs and discussed the procedure including the risks, benefits and alternatives for the proposed anesthesia with the patient or authorized representative who has indicated his/her understanding and acceptance.     Dental Advisory Given  Plan Discussed with: Anesthesiologist, CRNA and Surgeon  Anesthesia Plan Comments:         Anesthesia Quick Evaluation  "

## 2025-01-19 ENCOUNTER — Encounter: Payer: Self-pay | Admitting: Orthopedic Surgery

## 2025-01-19 ENCOUNTER — Other Ambulatory Visit: Payer: Self-pay

## 2025-01-19 DIAGNOSIS — M1712 Unilateral primary osteoarthritis, left knee: Secondary | ICD-10-CM | POA: Diagnosis not present

## 2025-01-19 LAB — CBC
HCT: 33.1 % — ABNORMAL LOW (ref 36.0–46.0)
Hemoglobin: 11.2 g/dL — ABNORMAL LOW (ref 12.0–15.0)
MCH: 29.9 pg (ref 26.0–34.0)
MCHC: 33.8 g/dL (ref 30.0–36.0)
MCV: 88.5 fL (ref 80.0–100.0)
Platelets: 211 K/uL (ref 150–400)
RBC: 3.74 MIL/uL — ABNORMAL LOW (ref 3.87–5.11)
RDW: 12.2 % (ref 11.5–15.5)
WBC: 13.8 K/uL — ABNORMAL HIGH (ref 4.0–10.5)
nRBC: 0 % (ref 0.0–0.2)

## 2025-01-19 LAB — BASIC METABOLIC PANEL WITH GFR
Anion gap: 8 (ref 5–15)
BUN: 12 mg/dL (ref 8–23)
CO2: 23 mmol/L (ref 22–32)
Calcium: 8.7 mg/dL — ABNORMAL LOW (ref 8.9–10.3)
Chloride: 111 mmol/L (ref 98–111)
Creatinine, Ser: 0.63 mg/dL (ref 0.44–1.00)
GFR, Estimated: >60 mL/min
Glucose, Bld: 128 mg/dL — ABNORMAL HIGH (ref 70–99)
Potassium: 4.5 mmol/L (ref 3.5–5.1)
Sodium: 142 mmol/L (ref 135–145)

## 2025-01-19 MED ORDER — OXYCODONE HCL 5 MG PO TABS: 5.0000 mg | ORAL_TABLET | ORAL | 0 refills | Status: AC | PRN

## 2025-01-19 MED ORDER — OXYCODONE HCL 5 MG PO TABS
5.0000 mg | ORAL_TABLET | ORAL | 0 refills | Status: DC | PRN
  Filled 2025-01-19: qty 30, 3d supply, fill #0

## 2025-01-19 MED ORDER — ENOXAPARIN SODIUM 40 MG/0.4ML IJ SOSY: 40.0000 mg | PREFILLED_SYRINGE | INTRAMUSCULAR | 0 refills | Status: AC

## 2025-01-19 MED ORDER — ENOXAPARIN SODIUM 40 MG/0.4ML IJ SOSY: 40.0000 mg | PREFILLED_SYRINGE | Freq: Every day | INTRAMUSCULAR | 0 refills | Status: AC

## 2025-01-19 MED ORDER — ENOXAPARIN SODIUM 40 MG/0.4ML IJ SOSY
40.0000 mg | PREFILLED_SYRINGE | Freq: Every day | INTRAMUSCULAR | 0 refills | Status: DC
  Filled 2025-01-19: qty 5.6, 14d supply, fill #0

## 2025-01-19 MED ORDER — ACETAMINOPHEN 500 MG PO TABS
ORAL_TABLET | ORAL | Status: AC
  Filled 2025-01-19: qty 2

## 2025-01-19 NOTE — Progress Notes (Signed)
 DISCHARGE NOTE:   Pt dc with IV remove and dc instructions given. Pt educated on Lovenox  injection for at home use and how to properly dispose of needle. Pt has TED hose on and in place. Pt voices no questions or concerns at this time. Pt wheeled down to medical mall entrance by staff. Pt's family provided transportation.

## 2025-01-19 NOTE — Progress Notes (Signed)
" °  Subjective: 1 Day Post-Op Procedures (LRB): ARTHROPLASTY, KNEE, TOTAL (Left) Patient reports pain as mild.   Patient is well, and has had no acute complaints or problems Plan is to go Home after hospital stay. Negative for chest pain and shortness of breath Fever: no Gastrointestinal:negative for nausea and vomiting  Objective: Vital signs in last 24 hours: Temp:  [97 F (36.1 C)-97.5 F (36.4 C)] 97.5 F (36.4 C) (01/20 0424) Pulse Rate:  [55-80] 68 (01/20 0424) Resp:  [8-20] 18 (01/20 0424) BP: (89-127)/(46-88) 112/54 (01/20 0424) SpO2:  [94 %-100 %] 100 % (01/19 2025) Weight:  [71.1 kg] 71.1 kg (01/19 1653)  Intake/Output from previous day:  Intake/Output Summary (Last 24 hours) at 01/19/2025 0718 Last data filed at 01/19/2025 0342 Gross per 24 hour  Intake 1823.33 ml  Output 825 ml  Net 998.33 ml    Intake/Output this shift: No intake/output data recorded.  Labs: Recent Labs    01/19/25 0540  HGB 11.2*   Recent Labs    01/19/25 0540  WBC 13.8*  RBC 3.74*  HCT 33.1*  PLT 211   Recent Labs    01/19/25 0540  NA 142  K 4.5  CL 111  CO2 23  BUN 12  CREATININE 0.63  GLUCOSE 128*  CALCIUM 8.7*   No results for input(s): LABPT, INR in the last 72 hours.   EXAM General - Patient is Alert and Oriented Extremity - Neurovascular intact Sensation intact distally Dorsiflexion/Plantar flexion intact Compartment soft Dressing/Incision - clean, dry, no drainage Motor Function - intact, moving foot and toes well on exam. SLR independently  Past Medical History:  Diagnosis Date   Anxiety    Back pain    COPD (chronic obstructive pulmonary disease) (HCC)    Current smoker    DDD (degenerative disc disease), lumbar    Depression    GERD (gastroesophageal reflux disease)    High cholesterol    Lumbar radiculitis    Lung nodule    Osteoarthritis of left knee, unspecified osteoarthritis type    Pneumothorax 1982   Sleep apnea      Assessment/Plan: 1 Day Post-Op Procedures (LRB): ARTHROPLASTY, KNEE, TOTAL (Left) Principal Problem:   S/P TKR (total knee replacement), left  Estimated body mass index is 26.08 kg/m as calculated from the following:   Height as of this encounter: 5' 5 (1.651 m).   Weight as of this encounter: 71.1 kg. Advance diet Up with therapy D/C IV fluids Discharge home with home health  DVT Prophylaxis - Lovenox  and TED hose Weight-Bearing as tolerated to left leg  Krystal Doyne, PA-C Orthopaedic Surgery 01/19/2025, 7:18 AM  "

## 2025-01-19 NOTE — Anesthesia Postprocedure Evaluation (Signed)
"   Anesthesia Post Note  Patient: Vanessa Greer  Procedure(s) Performed: ARTHROPLASTY, KNEE, TOTAL (Left: Knee)  Patient location during evaluation: Short Stay Anesthesia Type: Spinal Level of consciousness: oriented and awake and alert Pain management: pain level controlled Vital Signs Assessment: post-procedure vital signs reviewed and stable Respiratory status: spontaneous breathing and respiratory function stable Cardiovascular status: blood pressure returned to baseline and stable Postop Assessment: no headache, no backache and no apparent nausea or vomiting Anesthetic complications: no   No notable events documented.   Last Vitals:  Vitals:   01/18/25 2333 01/19/25 0424  BP: (!) 89/65 (!) 112/54  Pulse: 79 68  Resp: 18 18  Temp: (!) 36.1 C (!) 36.4 C  SpO2:      Last Pain:  Vitals:   01/19/25 0556  TempSrc:   PainSc: 7                  Ilhan Debenedetto M. Danija Gosa      "

## 2025-01-19 NOTE — Plan of Care (Signed)
" °  Problem: Education: Goal: Knowledge of the prescribed therapeutic regimen will improve Outcome: Progressing   Problem: Bowel/Gastric: Goal: Gastrointestinal status for postoperative course will improve Outcome: Progressing   Problem: Cardiac: Goal: Ability to maintain an adequate cardiac output Outcome: Progressing   Problem: Neurological: Goal: Will regain or maintain usual level of consciousness Outcome: Progressing   Problem: Clinical Measurements: Goal: Ability to maintain clinical measurements within normal limits Outcome: Progressing   "

## 2025-01-19 NOTE — Evaluation (Signed)
 Physical Therapy Evaluation Patient Details Name: Vanessa Greer MRN: [id number] DOB: 24-Apr-1961 Today's Date: 01/19/2025  History of Present Illness  Patient is here for L TKA. WBAT  Clinical Impression  Patient received in bed, she is pleasant and ready for PT. Patient is mod I with bed mobility and transfers. She ambulated 150 feet with RW and supervision. Ambulated up/down 4 steps x 2 with B rails and supervision. Cues needed for sequencing. Patient doing very well and planning to return home today. Son and friends able to assist at home as needed. She will continue to benefit from skilled PT to improve strength, and independence.          If plan is discharge home, recommend the following: A little help with walking and/or transfers;A little help with bathing/dressing/bathroom;Help with stairs or ramp for entrance   Can travel by private vehicle    yes    Equipment Recommendations None recommended by PT  Recommendations for Other Services       Functional Status Assessment Patient has had a recent decline in their functional status and demonstrates the ability to make significant improvements in function in a reasonable and predictable amount of time.     Precautions / Restrictions Precautions Precautions: Fall Recall of Precautions/Restrictions: Intact Restrictions Weight Bearing Restrictions Per Provider Order: Yes LLE Weight Bearing Per Provider Order: Weight bearing as tolerated      Mobility  Bed Mobility Overal bed mobility: Modified Independent                  Transfers Overall transfer level: Modified independent Equipment used: Rolling walker (2 wheels)                    Ambulation/Gait Ambulation/Gait assistance: Supervision Gait Distance (Feet): 150 Feet Assistive device: Rolling walker (2 wheels) Gait Pattern/deviations: Step-through pattern, Decreased stride length Gait velocity: decr     General Gait Details: safe with RW and  supervision  Stairs Stairs: Yes Stairs assistance: Supervision Stair Management: Two rails, Forwards, Step to pattern Number of Stairs: 4 General stair comments: safe on steps with B Rails  Wheelchair Mobility     Tilt Bed    Modified Rankin (Stroke Patients Only)       Balance Overall balance assessment: Modified Independent                                           Pertinent Vitals/Pain Pain Assessment Pain Assessment: Faces Faces Pain Scale: Hurts a little bit Pain Location: L knee Pain Descriptors / Indicators: Discomfort, Sore Pain Intervention(s): Monitored during session, Premedicated before session, Repositioned, Ice applied    Home Living Family/patient expects to be discharged to:: Private residence Living Arrangements: Alone (son lives on property in RV) Available Help at Discharge: Family;Available PRN/intermittently Type of Home: House Home Access: Stairs to enter Entrance Stairs-Rails: Right;Left;Can reach both Entrance Stairs-Number of Steps: 3   Home Layout: One level Home Equipment: Agricultural Consultant (2 wheels)      Prior Function Prior Level of Function : Independent/Modified Independent             Mobility Comments: independent ADLs Comments: independent     Extremity/Trunk Assessment   Upper Extremity Assessment Upper Extremity Assessment: Overall WFL for tasks assessed    Lower Extremity Assessment Lower Extremity Assessment: LLE deficits/detail LLE Coordination: decreased gross motor  Cervical / Trunk Assessment Cervical / Trunk Assessment: Normal  Communication   Communication Communication: No apparent difficulties    Cognition Arousal: Alert Behavior During Therapy: WFL for tasks assessed/performed   PT - Cognitive impairments: No apparent impairments                         Following commands: Intact       Cueing Cueing Techniques: Verbal cues     General Comments       Exercises Total Joint Exercises Ankle Circles/Pumps: AROM, 5 reps, Both Quad Sets: AROM, Left, 5 reps Short Arc Quad: AAROM, Left, 5 reps Heel Slides: AROM, Left, 5 reps Hip ABduction/ADduction: AROM, Left, 5 reps Straight Leg Raises: AROM, Left, 5 reps Long Arc Quad: AAROM, Left, 5 reps Goniometric ROM: 0-90   Assessment/Plan    PT Assessment All further PT needs can be met in the next venue of care  PT Problem List Decreased strength;Decreased range of motion;Decreased activity tolerance;Decreased mobility;Pain       PT Treatment Interventions      PT Goals (Current goals can be found in the Care Plan section)  Acute Rehab PT Goals Patient Stated Goal: return home after lunch PT Goal Formulation: With patient Time For Goal Achievement: 01/20/25 Potential to Achieve Goals: Good    Frequency       Co-evaluation               AM-PAC PT 6 Clicks Mobility  Outcome Measure Help needed turning from your back to your side while in a flat bed without using bedrails?: None Help needed moving from lying on your back to sitting on the side of a flat bed without using bedrails?: None Help needed moving to and from a bed to a chair (including a wheelchair)?: A Little Help needed standing up from a chair using your arms (e.g., wheelchair or bedside chair)?: A Little Help needed to walk in hospital room?: A Little Help needed climbing 3-5 steps with a railing? : A Little 6 Click Score: 20    End of Session   Activity Tolerance: Patient tolerated treatment well Patient left: in chair;with call bell/phone within reach Nurse Communication: Mobility status PT Visit Diagnosis: Muscle weakness (generalized) (M62.81);Difficulty in walking, not elsewhere classified (R26.2)    Time: 9054-8982 PT Time Calculation (min) (ACUTE ONLY): 32 min   Charges:   PT Evaluation $PT Eval Moderate Complexity: 1 Mod PT Treatments $Gait Training: 8-22 mins PT General Charges $$ ACUTE PT  VISIT: 1 Visit        Kanen Mottola, PT, GCS 01/19/25,10:31 AM

## 2025-01-19 NOTE — Plan of Care (Signed)
  Problem: Clinical Measurements: Goal: Postoperative complications will be avoided or minimized Outcome: Progressing   

## 2025-03-15 ENCOUNTER — Encounter: Admitting: Nurse Practitioner
